# Patient Record
Sex: Male | Born: 1935
Health system: Southern US, Community
[De-identification: ages and names within clinical notes are randomized; demographics above are authoritative.]

## PROBLEM LIST (undated history)

## (undated) DIAGNOSIS — R5381 Other malaise: Secondary | ICD-10-CM

## (undated) DIAGNOSIS — K56609 Unspecified intestinal obstruction, unspecified as to partial versus complete obstruction: Secondary | ICD-10-CM

## (undated) DIAGNOSIS — M899 Disorder of bone, unspecified: Secondary | ICD-10-CM

## (undated) DIAGNOSIS — Z951 Presence of aortocoronary bypass graft: Principal | ICD-10-CM

## (undated) DIAGNOSIS — I10 Essential (primary) hypertension: Secondary | ICD-10-CM

## (undated) DIAGNOSIS — I4891 Unspecified atrial fibrillation: Secondary | ICD-10-CM

## (undated) HISTORY — DX: Unspecified atrial fibrillation: I48.91

## (undated) HISTORY — DX: Presence of aortocoronary bypass graft: Z95.1

## (undated) HISTORY — DX: Other malaise: R53.81

## (undated) HISTORY — DX: Essential (primary) hypertension: I10

## (undated) HISTORY — PX: PARATHYROIDECTOMY: SHX19

## (undated) HISTORY — DX: Unspecified intestinal obstruction, unspecified as to partial versus complete obstruction: K56.609

## (undated) HISTORY — DX: Disorder of bone, unspecified: M89.9

## (undated) HISTORY — PX: APPENDECTOMY: SHX54

## (undated) HISTORY — PX: HERNIA REPAIR: SHX51

---

## 2002-05-14 ENCOUNTER — Encounter: Payer: Self-pay | Admitting: Geriatric Medicine

## 2002-05-14 ENCOUNTER — Encounter: Admission: RE | Admit: 2002-05-14 | Discharge: 2002-05-14 | Payer: Self-pay | Admitting: Geriatric Medicine

## 2003-01-29 ENCOUNTER — Encounter: Payer: Self-pay | Admitting: Emergency Medicine

## 2003-01-29 ENCOUNTER — Emergency Department (HOSPITAL_COMMUNITY): Admission: EM | Admit: 2003-01-29 | Discharge: 2003-01-29 | Payer: Self-pay | Admitting: Emergency Medicine

## 2003-06-02 ENCOUNTER — Encounter: Payer: Self-pay | Admitting: Geriatric Medicine

## 2003-06-02 ENCOUNTER — Encounter: Admission: RE | Admit: 2003-06-02 | Discharge: 2003-06-02 | Payer: Self-pay | Admitting: Geriatric Medicine

## 2004-06-29 ENCOUNTER — Encounter: Admission: RE | Admit: 2004-06-29 | Discharge: 2004-06-29 | Payer: Self-pay | Admitting: Geriatric Medicine

## 2006-07-29 ENCOUNTER — Encounter: Admission: RE | Admit: 2006-07-29 | Discharge: 2006-07-29 | Payer: Self-pay | Admitting: Geriatric Medicine

## 2007-02-05 ENCOUNTER — Encounter: Payer: Self-pay | Admitting: Cardiology

## 2008-02-09 ENCOUNTER — Encounter: Payer: Self-pay | Admitting: Cardiology

## 2008-02-11 ENCOUNTER — Encounter: Admission: RE | Admit: 2008-02-11 | Discharge: 2008-02-11 | Payer: Self-pay | Admitting: Geriatric Medicine

## 2010-07-31 ENCOUNTER — Ambulatory Visit: Payer: Self-pay | Admitting: Internal Medicine

## 2010-07-31 ENCOUNTER — Inpatient Hospital Stay (HOSPITAL_COMMUNITY): Admission: EM | Admit: 2010-07-31 | Discharge: 2010-08-02 | Payer: Self-pay | Admitting: Emergency Medicine

## 2010-08-01 ENCOUNTER — Encounter (INDEPENDENT_AMBULATORY_CARE_PROVIDER_SITE_OTHER): Payer: Self-pay | Admitting: Internal Medicine

## 2010-08-04 ENCOUNTER — Ambulatory Visit: Payer: Self-pay | Admitting: Internal Medicine

## 2010-08-04 LAB — CONVERTED CEMR LAB

## 2010-08-08 ENCOUNTER — Encounter: Payer: Self-pay | Admitting: Internal Medicine

## 2010-08-15 ENCOUNTER — Encounter: Payer: Self-pay | Admitting: Internal Medicine

## 2010-08-15 ENCOUNTER — Inpatient Hospital Stay (HOSPITAL_COMMUNITY): Admission: EM | Admit: 2010-08-15 | Discharge: 2010-08-18 | Payer: Self-pay | Admitting: Emergency Medicine

## 2010-08-15 ENCOUNTER — Ambulatory Visit: Payer: Self-pay | Admitting: Internal Medicine

## 2010-08-18 ENCOUNTER — Encounter: Payer: Self-pay | Admitting: Internal Medicine

## 2010-08-21 ENCOUNTER — Ambulatory Visit: Payer: Self-pay | Admitting: Physician Assistant

## 2010-08-21 DIAGNOSIS — I10 Essential (primary) hypertension: Secondary | ICD-10-CM | POA: Insufficient documentation

## 2010-08-21 DIAGNOSIS — I4891 Unspecified atrial fibrillation: Secondary | ICD-10-CM | POA: Insufficient documentation

## 2010-08-28 ENCOUNTER — Telehealth (INDEPENDENT_AMBULATORY_CARE_PROVIDER_SITE_OTHER): Payer: Self-pay | Admitting: *Deleted

## 2010-09-15 ENCOUNTER — Encounter: Payer: Self-pay | Admitting: Internal Medicine

## 2010-09-18 ENCOUNTER — Ambulatory Visit: Payer: Self-pay | Admitting: Internal Medicine

## 2010-10-24 ENCOUNTER — Encounter: Payer: Self-pay | Admitting: Internal Medicine

## 2010-10-27 ENCOUNTER — Encounter: Payer: Self-pay | Admitting: Internal Medicine

## 2010-11-08 ENCOUNTER — Encounter: Payer: Self-pay | Admitting: Cardiovascular Disease

## 2010-11-10 ENCOUNTER — Encounter: Payer: Self-pay | Admitting: Cardiovascular Disease

## 2010-11-10 ENCOUNTER — Telehealth (INDEPENDENT_AMBULATORY_CARE_PROVIDER_SITE_OTHER): Payer: Self-pay | Admitting: *Deleted

## 2010-11-13 ENCOUNTER — Other Ambulatory Visit: Payer: Self-pay | Admitting: Internal Medicine

## 2010-11-13 ENCOUNTER — Ambulatory Visit: Admission: RE | Admit: 2010-11-13 | Discharge: 2010-11-13 | Payer: Self-pay | Source: Home / Self Care

## 2010-11-13 LAB — CBC WITH DIFFERENTIAL/PLATELET
Eosinophils Absolute: 0.2 10*3/uL (ref 0.0–0.7)
Eosinophils Relative: 1.8 % (ref 0.0–5.0)
MCHC: 33.7 g/dL (ref 30.0–36.0)
MCV: 94 fl (ref 78.0–100.0)
Monocytes Absolute: 0.5 10*3/uL (ref 0.1–1.0)
Neutrophils Relative %: 76 % (ref 43.0–77.0)
Platelets: 294 10*3/uL (ref 150.0–400.0)
WBC: 8.4 10*3/uL (ref 4.5–10.5)

## 2010-11-13 LAB — BASIC METABOLIC PANEL
BUN: 13 mg/dL (ref 6–23)
Chloride: 99 mEq/L (ref 96–112)
Creatinine, Ser: 1 mg/dL (ref 0.4–1.5)

## 2010-11-13 LAB — PROTIME-INR: Prothrombin Time: 21.3 s — ABNORMAL HIGH (ref 10.2–12.4)

## 2010-11-13 LAB — APTT: aPTT: 36.4 s — ABNORMAL HIGH (ref 21.7–28.8)

## 2010-11-14 NOTE — Assessment & Plan Note (Signed)
Summary: ROV. F/U AFIB. ? CARDIOVERSION. GD   Visit Type:  ROV / CARDIOVERSION Primary Provider:  Dr Merlene Laughter  CC:  no complaints.  History of Present Illness: [name] is seen following evaluation in the hospital in October for new onset atrial fibrillation occurred in the context of systemic illness. His son had a rapid ventricular response which was controlled adequately with rate controlling medications. Echo cardiogram at that time demonstrated normal left ventricular function and moderate left atrial dilatation. The hospital records were reviewed  Prior to his cardioversion, he developed a small bowel obstruction and was treated medically. He now presents with anticipated cardioversion.  his INR on presentation to come hospital in early November was 1.9. According to the patient he has had one subsequent to that at 1.8.  He has no symptoms that I can attribute to his atrial fibrillation.  Problems Prior to Update: 1)  Essential Hypertension, Benign  (ICD-401.1) 2)  Atrial Fibrillation  (ICD-427.31)  Current Medications (verified): 1)  Diltiazem Hcl Er Beads 180 Mg Xr24h-Cap (Diltiazem Hcl Er Beads) .... Take One Capsule By Mouth Daily 2)  Metoprolol Tartrate 100 Mg Tabs (Metoprolol Tartrate) .... Take One Tablet By Mouth Twice A Day 3)  Tums 500 Mg Chew (Calcium Carbonate Antacid) .... Take 1 Tablet By Mouth Three Times A Day 4)  Coumadin 5 Mg Tabs (Warfarin Sodium) .... As Directed 5)  Centrum Silver  Tabs (Multiple Vitamins-Minerals) .... Take 1 Tablet By Mouth Once A Day  Allergies (verified): No Known Drug Allergies  Past History:  Past Medical History: Last updated: 09/07/10  1. Small-bowel obstruction.   2. Atrial fibrillation with rapid ventricular response (resolved).   3. Hypertension.   4. Lytic bone lesions.   5. Debility.   Past Surgical History: Last updated: 07-Sep-2010  1. Remote appendectomy.   2. Hernia repair x2.   3.. History of  parathyroidectomy.      Family History: Last updated: 2010/09/07  His mother died at age of 78 from old age.  Father   deceased at age of 104 from old age.  No history of CAD, stroke or heart  attack between the 2.      Social History: Last updated: 09/07/2010  Patient is a retired Financial controller.  Lives at [facility] independent living with his wife of 64 years.  The patient has 1  child who lives in [place].  He denies tobacco abuse, drinks 1 glass of  wine every day, and denies any drug abuse.      Vital Signs:  Patient profile:   77 year old male Height:      57 inches Weight:      146.50 pounds BMI:     31.82 Pulse rate:   56 / minute BP sitting:   138 / 82  (left arm) Cuff size:   regular  Vitals Entered By: [name] CMA (September 18, 2010 9:58 AM)  Physical Exam  General:  The patient was alert and oriented in no acute distress. HEENT Normal.  Neck veins were flat, carotids were brisk.  Lungs were clear.  Heart sounds were regular without murmurs or gallops.  Abdomen was soft with active bowel sounds. There is no clubbing cyanosis or edema. Skin Warm and dry    Impression & Recommendations:  Problem # 1:  ATRIAL FIBRILLATION (ICD-427.31) the patient has atrial fibrillation is persistent. We will try and undertake cardioversion the absence of a terrific support to see if  we can restore and maintain sinus rhythm. Given his paucity associated symptoms, however, I would not pursue a strategy of rhythm control.  We will obtain the records from Dr. Laverle Hobby office as to the consistency of his INRs. When we have three> 2.0  we will proceed with cardioversion  obtain a review of the records from Dr. Pete Glatter, his most recent INR was 11/22. It was 1.85. I have been in contact with his office to clarify our goals of INRs greater than 2. They will let us know when we have 3 Weeks in a row His updated medication list for this problem includes:    Metoprolol  Tartrate 100 Mg Tabs (Metoprolol tartrate) .[name]... Take one tablet by mouth twice a day    Coumadin 5 Mg Tabs (Warfarin sodium) .[name]... As directed  Patient Instructions: 1)  Your physician recommends that you continue on your current medications as directed. Please refer to the Current Medication list given to you today. 2)  Notify this office when your INR is therapeutic.

## 2010-11-14 NOTE — Medication Information (Signed)
Summary: ADD ON  COUMADIN OK PER ERICA/SL  Anticoagulant Therapy  Managed by: Cloyde Reams, RN, BSN Referring MD: Dr Sherryl Manges PCP: Dr Merlene Laughter Supervising MD: Tenny Craw MD, Gunnar Fusi Indication 1: atrial Fibrillation 427.31 Lab Used: [facility] [place] Site: [address] INR POC 1.0 INR RANGE 2.0-3.0  Dietary changes: yes       Details: Educated on importance of dietary consistancy of vit K.   Health status changes: no    Bleeding/hemorrhagic complications: yes       Details: Advised to monitor for s+s of bleeding and to call with any problems.   Recent/future hospitalizations: yes       Details: Hospitalized 07/31/10-08/02/10.   Any changes in medication regimen? yes       Details: Advised pt to call clinic to notify if any medications stopped or started.   Recent/future dental: no  Any missed doses?: no       Is patient compliant with meds? no     Details: Educated pt on importance of medication compliance.   Comments: INR on 08/02/10 at discharge was 1.0.  Discharged on 3mg  daily of Coumadin, given an eight day rx.  Per Dr Graciela Husbands possibly pending DCCV after 3 weeks therapeutic INR's. Pt has appt to see Dr Graciela Husbands on 08/21/10. New pt education done.    Anticoagulation Management History:      The patient comes in today for his initial visit for anticoagulation therapy.  Positive risk factors for bleeding include an age of 75 years or older.  The bleeding index is 'intermediate risk'.  Positive CHADS2 values include Age > 75 years old.  Anticoagulation responsible Daylah Sayavong: Tenny Craw MD, Gunnar Fusi.  INR POC: 1.0.  Cuvette Lot#: [id number].  Exp: 09/2011.    Anticoagulation Management Assessment/Plan:      The target INR is 2.0-3.0.  The next INR is due 08/08/2010.  Results were reviewed/authorized by Cloyde Reams, RN, BSN.  He was notified by Cloyde Reams RN.         Current Anticoagulation Instructions: INR 1.0  Continue taking 3mg  daily.  Recheck on Tuesday 08/08/10  at Dr Laverle Hobby office. Call us at [phone] and let us know if he is agreeable to follow Coumadin or if we need to schedule f/u appt here.

## 2010-11-14 NOTE — Progress Notes (Signed)
  Phone Note From Other Clinic   Caller: Dr.Stoneking/[facility] Initial call taken by: KM    Faxed LOV to [phone] [facility]  August 28, 2010 2:07 PM

## 2010-11-14 NOTE — Consult Note (Signed)
[facility]   Imported By: Roderic Ovens 08/28/2010 09:29:06  _____________________________________________________________________  External Attachment:    Type:   Image     Comment:   External Document

## 2010-11-14 NOTE — Assessment & Plan Note (Signed)
Summary: eph/ gd   Visit Type:  Follow-up Primary Provider:  Dr Merlene Laughter  CC:  Post-hospital.  History of Present Illness: [name] is an 77 year old male with a history of hypertension and possible multiple myeloma who was recently admitted with elevated heart rate.  He was noted to be in atrial fibrillation with elevated heart rate.  He was placed on Coumadin and rate controlling medications.  He returned to the hospital couple of weeks later with a small bowel obstruction.  Cardiology was asked to manage his rate controlling medications during that admission. He returns today for post hospitalization followup.  He is doing well.  No palps.  No chest pain.  No dyspnea.  No orthopnea or PND.  No edema.  No syncope. Dr. Pete Glatter follows his coumadin at [facility].  He has not had checked since discharge.  His INR was 1.9 in the [facility] on 08/18/2010.  He expects to have an elective DCCV once his INRs have been therapeutic for an acceptable amount of time.   Current Medications (verified): 1)  Diltiazem Hcl Er Beads 120 Mg Xr24h-Cap (Diltiazem Hcl Er Beads) .... Take One Capsule By Mouth Daily 2)  Metoprolol Tartrate 100 Mg Tabs (Metoprolol Tartrate) .... Take One Tablet By Mouth Twice A Day 3)  Tums 500 Mg Chew (Calcium Carbonate Antacid) .... Take 1 Tablet By Mouth Three Times A Day 4)  Coumadin 5 Mg Tabs (Warfarin Sodium) .... As Directed 5)  Centrum Silver  Tabs (Multiple Vitamins-Minerals) .... Take 1 Tablet By Mouth Once A Day  Allergies (verified): No Known Drug Allergies  Past History:  Past Medical History: Persistent Atrial fibrillation     a.  coumadin therapy Hypertension.  Echo 07/2010:  EF 60-65%, mild LVH, Mod LAE Possible Multiple Myeloma Small bowel obstruction admx 08/15/2010         Past Surgical History: Status post remote appendectomy.  Status post parathyroidectomy  Status post hernia repair.      Social History: The patient lives in [place]  at the [facility] with his wife.  He is a retired Designer, multimedia.  He has a son who lives in [place].  He drinks 1 bourbon and   water every day around 5 p.m.  He denies any tobacco or illicit drug   use.  He exercises regularly.   Vital Signs:  Patient profile:   77 year old male Height:      57 inches Weight:      147.75 pounds BMI:     32.09 Pulse rate:   93 / minute Pulse rhythm:   irregular Resp:     18 per minute BP sitting:   160 / 82  (right arm) Cuff size:   regular  Vitals Entered By: Vikki Ports (August 21, 2010 2:43 PM)  Physical Exam  General:  Well nourished, well developed, in no acute distress HEENT: normal Neck: no JVD Cardiac:  normal S1, S2; irreg irreg rhythm; no murmur Lungs:  clear to auscultation bilaterally, no wheezing, rhonchi or rales Abd: soft, nontender, no hepatomegaly Ext: no edema Vascular: no carotid  bruits Skin: warm and dry Neuro:  CNs 2-12 intact, no focal abnormalities noted    EKG  Procedure date:  08/21/2010  Findings:      Atrial fibrillation with a controlled ventricular response rate of: 93 Left axis deviation Interventricular conduction delay Incomplete right bundle branch block No ischemic changes  Impression & Recommendations:  Problem #  1:  ATRIAL FIBRILLATION (ICD-427.31)  Will have [facility] continue to monitor INRs. Dr. Pete Glatter to manage coumadin. INRs will be sent to [place] and elective DCCV will be arranged once he has 3 therapeutic INRs in a row. Discussed with Dr. Graciela Husbands.  Orders: EKG w/ Interpretation (93000)  Problem # 2:  ESSENTIAL HYPERTENSION, BENIGN (ICD-401.1) Repeat 140/80 by me. HR in 90s and likely goes much higher with exercise. Change Diltiazem to 180 mg once daily.  His updated medication list for this problem includes:    Diltiazem Hcl Er Beads 180 Mg Xr24h-cap (Diltiazem hcl er beads) .[name]... Take one capsule by mouth daily    Metoprolol Tartrate 100 Mg  Tabs (Metoprolol tartrate) .[name]... Take one tablet by mouth twice a day  Patient Instructions: 1)  We will arrange the cardioversion once you have a therapeutic coumadin level for 3 weeks. 2)  If you have not heard from [place] in 4 weeks, please call. 3)  We will arrange follow up with Dr. Graciela Husbands at the time of your cardioversion. 4)  Your physician has recommended you make the following change in your medication: Diltiazem 180 mg. Take one tablet by mouth daily 5)  Please check INR tomorrow  and every week in Dr. Hayden Rasmussen office fax INR results to our office to Dr. Graciela Husbands Fax # [phone] . When pt has 2 to 3 levels of  3 INR in a road, pt will be schedule for Cardioversion   Prescriptions: DILTIAZEM HCL ER BEADS 180 MG XR24H-CAP (DILTIAZEM HCL ER BEADS) Take one capsule by mouth daily  #30 x 6   Entered by:   Ollen Gross, RN, BSN   Authorized by:   Tereso Newcomer PA-C   Signed by:   Ollen Gross, RN, BSN on 08/21/2010   Method used:   Electronically to        [facility]. [phone]* (retail)       [address]       [place], [place]  [address]       Ph: [phone]       Fax: [phone]   RxID:   [id number]  I have personally reviewed the prescriptions today for accuracy. Tereso Newcomer PA-C  August 21, 2010 5:22 PM

## 2010-11-14 NOTE — Letter (Signed)
Summary: [facility]   Imported By: Roderic Ovens 08/31/2010 12:20:49  _____________________________________________________________________  External Attachment:    Type:   Image     Comment:   External Document

## 2010-11-15 ENCOUNTER — Ambulatory Visit (HOSPITAL_COMMUNITY)
Admission: RE | Admit: 2010-11-15 | Discharge: 2010-11-15 | Disposition: A | Discharge status: Home / Self Care | Payer: Medicare Other | Source: Ambulatory Visit | Attending: Cardiovascular Disease | Admitting: Cardiovascular Disease

## 2010-11-15 DIAGNOSIS — Z5309 Procedure and treatment not carried out because of other contraindication: Secondary | ICD-10-CM | POA: Insufficient documentation

## 2010-11-15 DIAGNOSIS — R791 Abnormal coagulation profile: Secondary | ICD-10-CM | POA: Insufficient documentation

## 2010-11-15 DIAGNOSIS — I4891 Unspecified atrial fibrillation: Secondary | ICD-10-CM | POA: Insufficient documentation

## 2010-11-16 NOTE — Medication Information (Signed)
Summary: Coumadin Clinic  Anticoagulant Therapy  Managed by: Inactive Referring MD: Dr Sherryl Manges PCP: Dr Merlene Laughter Supervising MD: Tenny Craw MD, Gunnar Fusi Indication 1: atrial Fibrillation 427.31 Lab Used: [facility]  Site: [address] INR RANGE 2.0-3.0          Comments: Dr Pete Glatter is dosing INR's  Allergies: No Known Drug Allergies  Anticoagulation Management History:      Positive risk factors for bleeding include an age of 75 years or older.  The bleeding index is 'intermediate risk'.  Positive CHADS2 values include History of HTN and Age > 75 years old.  Anticoagulation responsible provider: Tenny Craw MD, Gunnar Fusi.  Exp: 09/2011.    Anticoagulation Management Assessment/Plan:      The patient's current anticoagulation dose is Coumadin 5 mg tabs: as directed.  The target INR is 2.0-3.0.  The next INR is due 08/08/2010.  Results were reviewed/authorized by Inactive.         Prior Anticoagulation Instructions: INR 1.0  Continue taking 3mg  daily.  Recheck on Tuesday 08/08/10 at Dr Laverle Hobby office. Call us at [phone] and let us know if he is agreeable to follow Coumadin or if we need to schedule f/u appt here.

## 2010-11-16 NOTE — Letter (Signed)
Summary: [name] Office Visit Note   [facility] Office Visit Note   Imported By: Roderic Ovens 09/27/2010 10:59:03  _____________________________________________________________________  External Attachment:    Type:   Image     Comment:   External Document

## 2010-11-16 NOTE — Progress Notes (Signed)
  Phone Note Outgoing Call   Call placed by: Scherrie Bateman, LPN,  November 10, 2010 12:24 PM Call placed to: Patient Summary of Call: CALLED PT TO SCHEDULE  CARDIOVERSION PER 11/07/10 OFFICE NOTE CARDIOVERSION SCEHUDLED FOR 11/15/10 AT 10:00 AM.PT COMING IN MON 11/13/10  AT 10:30 FOR PRE PROCEDURE LABS. Initial call taken by: Scherrie Bateman, LPN,  November 10, 2010 12:26 PM

## 2010-11-16 NOTE — Letter (Addendum)
Summary: Cardioversion/TEE Instructions  [facility], Main Office  [address]   [place], [place] [address]   Phone: [phone]  Fax: [phone]     Cardioversion Instructions  11/10/2010 MRN: [id number]  [facility] [address] [place], [place]  [address]  Dear [name], You are scheduled for a  Cardioversion on ____________2/1/12________________ with Dr. ________DR Theron Arista NISHAN_________________________________.   Please arrive at the [facility] at _______8:00______ a.m. [name] on the day of your procedure.  1)   DIET:  A)   Nothing to eat or drink after midnight except your medications with a sip of water.  B).  2)   Come to the [place] office on ______1/30/12______________ for lab work. The lab at [facility] is open from 8:30 a.m. to 1:30 p.m. and 2:30 p.m. to 5:00 p.m. The lab at [facility] is open from 7:30 a.m. to 5:30 p.m. You do not have to be fasting.  3)   MAKE SURE YOU TAKE YOUR COUMADIN.  4)   A)       B)   YOU MAY TAKE ALL of your remaining medications with a small amount of water.    C)   START NEW medications:       ___________________________________________________________________     ___________________________________________________________________  5)  Must have a responsible person to drive you home.  6)   Bring a current list of your medications and current insurance cards.   * Special Note:  Every effort is made to have your procedure done on time. Occasionally there are emergencies that present themselves at the hospital that may cause delays. Please be patient if a delay does occur.  * If you have any questions after you get home, please call the office at [phone].

## 2010-11-22 ENCOUNTER — Telehealth: Payer: Self-pay | Admitting: Internal Medicine

## 2010-11-30 NOTE — Progress Notes (Signed)
Summary: has the patient had recent cardioversion  Phone Note From Other Clinic Call back at [facility] Phone [phone]   Caller: stephaine office [phone].  Request: Talk with Nurse Summary of Call: has the patient had a recent cardioversion.  Initial call taken by: Lorne Skeens,  November 22, 2010 11:45 AM  Follow-up for Phone Call        Judeth Cornfield the caller is at lunch. I will call her taler. Ollen Gross, RN, BSN  November 22, 2010 12:32 PM  I left a detail message to stephanie  in her answer service at [facility] physicians. "Pt. did not have the cardioversion on 11/15/10. Procedure was canceled due to blood Pt/INR was not to where it needed to be to do the cardioversion". Call the office if any questions. Follow-up by: Ollen Gross, RN, BSN,  November 22, 2010 3:33 PM

## 2010-12-05 ENCOUNTER — Encounter: Payer: Medicare Other | Admitting: Internal Medicine

## 2010-12-21 NOTE — Letter (Signed)
Summary: [name]: Telephone Encounter  [name]: Telephone Encounter   Imported By: Earl Many 12/08/2010 08:49:31  _____________________________________________________________________  External Attachment:    Type:   Image     Comment:   External Document

## 2010-12-22 ENCOUNTER — Encounter (INDEPENDENT_AMBULATORY_CARE_PROVIDER_SITE_OTHER): Payer: Self-pay | Admitting: *Deleted

## 2010-12-26 ENCOUNTER — Encounter: Payer: Self-pay | Admitting: Internal Medicine

## 2010-12-26 LAB — PROTEIN ELECTROPH W RFLX QUANT IMMUNOGLOBULINS
Alpha-2-Globulin: 14.7 % — ABNORMAL HIGH (ref 7.1–11.8)
Beta 2: 5.1 % (ref 3.2–6.5)
Beta Globulin: 5.8 % (ref 4.7–7.2)
Gamma Globulin: 14.6 % (ref 11.1–18.8)
M-Spike, %: NOT DETECTED g/dL
Total Protein ELP: 6.4 g/dL (ref 6.0–8.3)

## 2010-12-26 LAB — DIFFERENTIAL
Basophils Relative: 0 % (ref 0–1)
Lymphs Abs: 0.6 10*3/uL — ABNORMAL LOW (ref 0.7–4.0)
Monocytes Relative: 2 % — ABNORMAL LOW (ref 3–12)
Neutro Abs: 10.7 10*3/uL — ABNORMAL HIGH (ref 1.7–7.7)
Neutrophils Relative %: 92 % — ABNORMAL HIGH (ref 43–77)

## 2010-12-26 LAB — UIFE/LIGHT CHAINS/TP QN, 24-HR UR
Alpha 2, Urine: DETECTED — AB
Beta, Urine: DETECTED — AB
Free Kappa Lt Chains,Ur: 10.7 mg/dL — ABNORMAL HIGH (ref 0.04–1.51)
Free Kappa/Lambda Ratio: 7.23 ratio — ABNORMAL HIGH (ref 0.46–4.00)
Free Lambda Lt Chains,Ur: 1.48 mg/dL — ABNORMAL HIGH (ref 0.08–1.01)
Free Lt Chn Excr Rate: 208.65 mg/d
Gamma Globulin, Urine: NOT DETECTED
Time: 24 hours
Total Protein, Urine-Ur/day: 248 mg/d — ABNORMAL HIGH (ref 10–140)
Total Protein, Urine: 12.7 mg/dL
Volume, Urine: 1950 mL

## 2010-12-26 LAB — CARDIAC PANEL(CRET KIN+CKTOT+MB+TROPI)
CK, MB: 1.7 ng/mL (ref 0.3–4.0)
Relative Index: INVALID (ref 0.0–2.5)
Total CK: 68 U/L (ref 7–232)
Total CK: 74 U/L (ref 7–232)

## 2010-12-26 LAB — COMPREHENSIVE METABOLIC PANEL
ALT: 21 U/L (ref 0–53)
AST: 20 U/L (ref 0–37)
AST: 22 U/L (ref 0–37)
Albumin: 2.8 g/dL — ABNORMAL LOW (ref 3.5–5.2)
Albumin: 2.8 g/dL — ABNORMAL LOW (ref 3.5–5.2)
Alkaline Phosphatase: 53 U/L (ref 39–117)
Alkaline Phosphatase: 54 U/L (ref 39–117)
BUN: 13 mg/dL (ref 6–23)
BUN: 6 mg/dL (ref 6–23)
BUN: 7 mg/dL (ref 6–23)
CO2: 26 mEq/L (ref 19–32)
Calcium: 8 mg/dL — ABNORMAL LOW (ref 8.4–10.5)
Calcium: 8.6 mg/dL (ref 8.4–10.5)
Calcium: 9.3 mg/dL (ref 8.4–10.5)
Chloride: 105 mEq/L (ref 96–112)
Creatinine, Ser: 0.78 mg/dL (ref 0.4–1.5)
Creatinine, Ser: 0.86 mg/dL (ref 0.4–1.5)
GFR calc Af Amer: >60 mL/min (ref >60)
Glucose, Bld: 106 mg/dL — ABNORMAL HIGH (ref 70–99)
Glucose, Bld: 147 mg/dL — ABNORMAL HIGH (ref 70–99)
Glucose, Bld: 92 mg/dL (ref 70–99)
Potassium: 3.3 mEq/L — ABNORMAL LOW (ref 3.5–5.1)
Potassium: 3.5 mEq/L (ref 3.5–5.1)
Potassium: 4 mEq/L (ref 3.5–5.1)
Sodium: 137 mEq/L (ref 135–145)
Sodium: 138 mEq/L (ref 135–145)
Total Bilirubin: 0.7 mg/dL (ref 0.3–1.2)
Total Bilirubin: 0.8 mg/dL (ref 0.3–1.2)
Total Protein: 5.4 g/dL — ABNORMAL LOW (ref 6.0–8.3)
Total Protein: 6.2 g/dL (ref 6.0–8.3)
Total Protein: 6.7 g/dL (ref 6.0–8.3)

## 2010-12-26 LAB — URINE CULTURE

## 2010-12-26 LAB — CBC
HCT: 37.8 % — ABNORMAL LOW (ref 39.0–52.0)
HCT: 39.1 % (ref 39.0–52.0)
HCT: 42.4 % (ref 39.0–52.0)
HCT: 43.5 % (ref 39.0–52.0)
MCH: 32 pg (ref 26.0–34.0)
MCH: 32.2 pg (ref 26.0–34.0)
MCH: 32.2 pg (ref 26.0–34.0)
MCHC: 34 g/dL (ref 30.0–36.0)
MCHC: 34.5 g/dL (ref 30.0–36.0)
MCHC: 34.6 g/dL (ref 30.0–36.0)
MCV: 92.8 fL (ref 78.0–100.0)
MCV: 92.9 fL (ref 78.0–100.0)
MCV: 94.3 fL (ref 78.0–100.0)
MCV: 94.9 fL (ref 78.0–100.0)
Platelets: 264 10*3/uL (ref 150–400)
Platelets: 285 10*3/uL (ref 150–400)
RBC: 4.21 MIL/uL — ABNORMAL LOW (ref 4.22–5.81)
RDW: 13.7 % (ref 11.5–15.5)
RDW: 13.9 % (ref 11.5–15.5)
RDW: 13.9 % (ref 11.5–15.5)
RDW: 13.9 % (ref 11.5–15.5)
WBC: 8.7 10*3/uL (ref 4.0–10.5)
WBC: 9.7 10*3/uL (ref 4.0–10.5)
WBC: 9.9 10*3/uL (ref 4.0–10.5)

## 2010-12-26 LAB — PROTIME-INR
INR: 1.91 — ABNORMAL HIGH (ref 0.00–1.49)
INR: 2.12 — ABNORMAL HIGH (ref 0.00–1.49)
INR: 2.49 — ABNORMAL HIGH (ref 0.00–1.49)
INR: 2.56 — ABNORMAL HIGH (ref 0.00–1.49)
Prothrombin Time: 27 seconds — ABNORMAL HIGH (ref 11.6–15.2)

## 2010-12-26 LAB — URINALYSIS, ROUTINE W REFLEX MICROSCOPIC
Bilirubin Urine: NEGATIVE
Nitrite: NEGATIVE
Protein, ur: NEGATIVE mg/dL
Specific Gravity, Urine: 1.018 (ref 1.005–1.030)
Urobilinogen, UA: 0.2 mg/dL (ref 0.0–1.0)

## 2010-12-26 LAB — CK TOTAL AND CKMB (NOT AT ARMC)
CK, MB: 2.2 ng/mL (ref 0.3–4.0)
Total CK: 50 U/L (ref 7–232)

## 2010-12-26 LAB — LIPASE, BLOOD: Lipase: 31 U/L (ref 11–59)

## 2010-12-26 LAB — TROPONIN I: Troponin I: 0.01 ng/mL (ref 0.00–0.06)

## 2010-12-26 NOTE — Letter (Signed)
Summary: Appointment - Reschedule  [employer], Main Office  [address]   [place], [place] [address]   Phone: [phone]  Fax: [phone]     December 22, 2010 MRN: [id number]   [facility] [address] [place], [place]  [address]   Dear Mr. SWEENEY,   Due to a change in our office schedule, your appointment on 01-09-2011                      at    10:45 a.m.            must be changed.  It is very important that we reach you to reschedule this appointment. We look forward to participating in your health care needs. Please contact us at the number listed above at your earliest convenience to reschedule this appointment.     Sincerely,      Lorne Skeens  [facility] Scheduling Team

## 2010-12-27 LAB — CBC
MCH: 31.4 pg (ref 26.0–34.0)
MCV: 92.2 fL (ref 78.0–100.0)
MCV: 94.3 fL (ref 78.0–100.0)
Platelets: 294 10*3/uL (ref 150–400)
Platelets: 299 10*3/uL (ref 150–400)
RBC: 4.23 MIL/uL (ref 4.22–5.81)
RBC: 4.6 MIL/uL (ref 4.22–5.81)
WBC: 8 10*3/uL (ref 4.0–10.5)

## 2010-12-27 LAB — PROTIME-INR: INR: 1 (ref 0.00–1.49)

## 2010-12-27 LAB — CK TOTAL AND CKMB (NOT AT ARMC)
CK, MB: 1.9 ng/mL (ref 0.3–4.0)
Relative Index: INVALID (ref 0.0–2.5)

## 2010-12-27 LAB — DIFFERENTIAL
Eosinophils Relative: 1 % (ref 0–5)
Lymphocytes Relative: 14 % (ref 12–46)
Lymphs Abs: 1.1 10*3/uL (ref 0.7–4.0)
Monocytes Absolute: 0.9 10*3/uL (ref 0.1–1.0)

## 2010-12-27 LAB — COMPREHENSIVE METABOLIC PANEL
AST: 18 U/L (ref 0–37)
Albumin: 3.5 g/dL (ref 3.5–5.2)
Calcium: 8.8 mg/dL (ref 8.4–10.5)
Creatinine, Ser: 0.87 mg/dL (ref 0.4–1.5)
GFR calc Af Amer: >60 mL/min (ref >60)

## 2010-12-27 LAB — CARDIAC PANEL(CRET KIN+CKTOT+MB+TROPI)
Relative Index: INVALID (ref 0.0–2.5)
Troponin I: 0.04 ng/mL (ref 0.00–0.06)

## 2010-12-27 LAB — TSH
TSH: 1.567 u[IU]/mL (ref 0.350–4.500)
TSH: 2.018 u[IU]/mL (ref 0.350–4.500)

## 2010-12-27 LAB — TROPONIN I: Troponin I: <0.01 ng/mL (ref 0.00–0.06)

## 2010-12-29 ENCOUNTER — Telehealth: Payer: Self-pay | Admitting: *Deleted

## 2011-01-02 NOTE — Progress Notes (Signed)
  Phone Note Outgoing Call   Call placed by: Scherrie Bateman, LPN,  December 29, 2010 2:33 PM Call placed to: Patient Summary of Call: SPOKE WITH PT HAS STARTED SEEING DR TURNER FOR CARDIAC NEEDS HAS DECIDED TO TAKE by mouth MEDS FOR RATE CONTROL HAVE DECIDED AGAINST CARDIOVERSION AT THIS TIME. WILL F/U WITH DR TURNER IN FUTURE  FOR CONTINUING CARDIAC CARE. Initial call taken by: Scherrie Bateman, LPN,  December 29, 2010 2:36 PM

## 2011-01-09 ENCOUNTER — Encounter: Payer: Medicare Other | Admitting: Internal Medicine

## 2011-01-30 ENCOUNTER — Encounter: Payer: Self-pay | Admitting: Internal Medicine

## 2013-10-17 ENCOUNTER — Other Ambulatory Visit: Payer: Self-pay

## 2013-10-17 ENCOUNTER — Ambulatory Visit (INDEPENDENT_AMBULATORY_CARE_PROVIDER_SITE_OTHER): Payer: Medicare Other | Admitting: Family Medicine

## 2013-10-17 ENCOUNTER — Ambulatory Visit: Payer: Medicare Other

## 2013-10-17 ENCOUNTER — Encounter (HOSPITAL_COMMUNITY): Payer: Self-pay | Admitting: Emergency Medicine

## 2013-10-17 ENCOUNTER — Inpatient Hospital Stay (HOSPITAL_COMMUNITY)
Admission: EM | Admit: 2013-10-17 | Discharge: 2013-10-29 | DRG: 233 | Disposition: A | Discharge status: Skilled Nursing Facility | Payer: Medicare Other | Attending: Cardiothoracic Surgery | Admitting: Cardiothoracic Surgery

## 2013-10-17 VITALS — BP 130/80 | HR 116 | Temp 98.0°F | Resp 17 | Ht 63.5 in | Wt 153.0 lb

## 2013-10-17 DIAGNOSIS — N179 Acute kidney failure, unspecified: Secondary | ICD-10-CM | POA: Diagnosis not present

## 2013-10-17 DIAGNOSIS — Z77090 Contact with and (suspected) exposure to asbestos: Secondary | ICD-10-CM

## 2013-10-17 DIAGNOSIS — I700 Atherosclerosis of aorta: Secondary | ICD-10-CM | POA: Diagnosis present

## 2013-10-17 DIAGNOSIS — I4891 Unspecified atrial fibrillation: Secondary | ICD-10-CM

## 2013-10-17 DIAGNOSIS — Z7901 Long term (current) use of anticoagulants: Secondary | ICD-10-CM

## 2013-10-17 DIAGNOSIS — E871 Hypo-osmolality and hyponatremia: Secondary | ICD-10-CM | POA: Diagnosis not present

## 2013-10-17 DIAGNOSIS — I482 Chronic atrial fibrillation, unspecified: Secondary | ICD-10-CM | POA: Diagnosis present

## 2013-10-17 DIAGNOSIS — D62 Acute posthemorrhagic anemia: Secondary | ICD-10-CM | POA: Diagnosis not present

## 2013-10-17 DIAGNOSIS — I10 Essential (primary) hypertension: Secondary | ICD-10-CM | POA: Diagnosis present

## 2013-10-17 DIAGNOSIS — Z87891 Personal history of nicotine dependence: Secondary | ICD-10-CM

## 2013-10-17 DIAGNOSIS — I509 Heart failure, unspecified: Secondary | ICD-10-CM

## 2013-10-17 DIAGNOSIS — I959 Hypotension, unspecified: Secondary | ICD-10-CM | POA: Diagnosis not present

## 2013-10-17 DIAGNOSIS — Z8709 Personal history of other diseases of the respiratory system: Secondary | ICD-10-CM

## 2013-10-17 DIAGNOSIS — R091 Pleurisy: Secondary | ICD-10-CM | POA: Diagnosis present

## 2013-10-17 DIAGNOSIS — Z951 Presence of aortocoronary bypass graft: Secondary | ICD-10-CM

## 2013-10-17 DIAGNOSIS — R197 Diarrhea, unspecified: Secondary | ICD-10-CM | POA: Diagnosis not present

## 2013-10-17 DIAGNOSIS — I214 Non-ST elevation (NSTEMI) myocardial infarction: Secondary | ICD-10-CM | POA: Diagnosis present

## 2013-10-17 DIAGNOSIS — I255 Ischemic cardiomyopathy: Secondary | ICD-10-CM

## 2013-10-17 DIAGNOSIS — I5021 Acute systolic (congestive) heart failure: Secondary | ICD-10-CM | POA: Diagnosis present

## 2013-10-17 DIAGNOSIS — R0602 Shortness of breath: Secondary | ICD-10-CM

## 2013-10-17 DIAGNOSIS — R5381 Other malaise: Secondary | ICD-10-CM | POA: Diagnosis present

## 2013-10-17 DIAGNOSIS — I2510 Atherosclerotic heart disease of native coronary artery without angina pectoris: Secondary | ICD-10-CM | POA: Diagnosis present

## 2013-10-17 DIAGNOSIS — I2589 Other forms of chronic ischemic heart disease: Secondary | ICD-10-CM | POA: Diagnosis present

## 2013-10-17 DIAGNOSIS — I2582 Chronic total occlusion of coronary artery: Secondary | ICD-10-CM | POA: Diagnosis present

## 2013-10-17 DIAGNOSIS — Z79899 Other long term (current) drug therapy: Secondary | ICD-10-CM

## 2013-10-17 DIAGNOSIS — I480 Paroxysmal atrial fibrillation: Secondary | ICD-10-CM | POA: Diagnosis present

## 2013-10-17 DIAGNOSIS — J9819 Other pulmonary collapse: Secondary | ICD-10-CM | POA: Diagnosis not present

## 2013-10-17 DIAGNOSIS — I4510 Unspecified right bundle-branch block: Secondary | ICD-10-CM | POA: Diagnosis present

## 2013-10-17 DIAGNOSIS — I252 Old myocardial infarction: Secondary | ICD-10-CM

## 2013-10-17 LAB — URINALYSIS, ROUTINE W REFLEX MICROSCOPIC
BILIRUBIN URINE: NEGATIVE
GLUCOSE, UA: NEGATIVE mg/dL
Hgb urine dipstick: NEGATIVE
KETONES UR: 40 mg/dL — AB
LEUKOCYTES UA: NEGATIVE
Nitrite: NEGATIVE
PROTEIN: NEGATIVE mg/dL
Specific Gravity, Urine: 1.018 (ref 1.005–1.030)
Urobilinogen, UA: 1 mg/dL (ref 0.0–1.0)
pH: 6 (ref 5.0–8.0)

## 2013-10-17 LAB — BASIC METABOLIC PANEL
BUN: 12 mg/dL (ref 6–23)
CALCIUM: 8.7 mg/dL (ref 8.4–10.5)
CO2: 25 meq/L (ref 19–32)
CREATININE: 0.83 mg/dL (ref 0.50–1.35)
Chloride: 101 mEq/L (ref 96–112)
GFR calc Af Amer: 85 mL/min — ABNORMAL LOW (ref >90)
GFR calc non Af Amer: 74 mL/min — ABNORMAL LOW (ref >90)
Glucose, Bld: 102 mg/dL — ABNORMAL HIGH (ref 70–99)
Potassium: 4.1 mEq/L (ref 3.7–5.3)
Sodium: 138 mEq/L (ref 137–147)

## 2013-10-17 LAB — PRO B NATRIURETIC PEPTIDE: PRO B NATRI PEPTIDE: 7408 pg/mL — AB (ref 0–450)

## 2013-10-17 LAB — CBC
HCT: 36.8 % — ABNORMAL LOW (ref 39.0–52.0)
HEMOGLOBIN: 12.5 g/dL — AB (ref 13.0–17.0)
MCH: 30.5 pg (ref 26.0–34.0)
MCHC: 34 g/dL (ref 30.0–36.0)
MCV: 89.8 fL (ref 78.0–100.0)
PLATELETS: 408 10*3/uL — AB (ref 150–400)
RBC: 4.1 MIL/uL — ABNORMAL LOW (ref 4.22–5.81)
RDW: 14.6 % (ref 11.5–15.5)
WBC: 7.8 10*3/uL (ref 4.0–10.5)

## 2013-10-17 LAB — TROPONIN I: Troponin I: <0.3 ng/mL (ref <0.30)

## 2013-10-17 LAB — POCT I-STAT TROPONIN I: Troponin i, poc: 0.16 ng/mL (ref 0.00–0.08)

## 2013-10-17 LAB — PROTIME-INR
INR: 4.12 — ABNORMAL HIGH (ref 0.00–1.49)
Prothrombin Time: 38.3 seconds — ABNORMAL HIGH (ref 11.6–15.2)

## 2013-10-17 LAB — MRSA PCR SCREENING: MRSA by PCR: NEGATIVE

## 2013-10-17 MED ORDER — DM-GUAIFENESIN ER 30-600 MG PO TB12
2.0000 | ORAL_TABLET | Freq: Two times a day (BID) | ORAL | Status: DC | PRN
  Filled 2013-10-17: qty 2

## 2013-10-17 MED ORDER — ADULT MULTIVITAMIN W/MINERALS CH
1.0000 | ORAL_TABLET | Freq: Every day | ORAL | Status: DC
  Administered 2013-10-18 – 2013-10-20 (×3): 1 via ORAL
  Filled 2013-10-17 (×4): qty 1

## 2013-10-17 MED ORDER — DILTIAZEM HCL 100 MG IV SOLR
5.0000 mg/h | INTRAVENOUS | Status: DC
  Administered 2013-10-17 – 2013-10-19 (×5): 15 mg/h via INTRAVENOUS
  Filled 2013-10-17 (×5): qty 100

## 2013-10-17 MED ORDER — FUROSEMIDE 10 MG/ML IJ SOLN
40.0000 mg | Freq: Once | INTRAMUSCULAR | Status: AC
  Administered 2013-10-17: 40 mg via INTRAVENOUS
  Filled 2013-10-17: qty 4

## 2013-10-17 MED ORDER — DILTIAZEM HCL 100 MG IV SOLR
5.0000 mg/h | Freq: Once | INTRAVENOUS | Status: AC
  Administered 2013-10-17: 5 mg/h via INTRAVENOUS

## 2013-10-17 MED ORDER — ASPIRIN 325 MG PO TABS
325.0000 mg | ORAL_TABLET | Freq: Once | ORAL | Status: AC
  Administered 2013-10-17: 325 mg via ORAL
  Filled 2013-10-17: qty 1

## 2013-10-17 MED ORDER — SODIUM CHLORIDE 0.9 % IJ SOLN
3.0000 mL | Freq: Two times a day (BID) | INTRAMUSCULAR | Status: DC
  Administered 2013-10-18 – 2013-10-20 (×2): 3 mL via INTRAVENOUS

## 2013-10-17 MED ORDER — NITROGLYCERIN 0.4 MG SL SUBL: 0.4000 mg | SUBLINGUAL_TABLET | SUBLINGUAL | Status: DC | PRN

## 2013-10-17 MED ORDER — ASPIRIN EC 81 MG PO TBEC
81.0000 mg | DELAYED_RELEASE_TABLET | Freq: Every day | ORAL | Status: DC
  Administered 2013-10-18 – 2013-10-20 (×3): 81 mg via ORAL
  Filled 2013-10-17 (×4): qty 1

## 2013-10-17 MED ORDER — ONE-DAILY MULTI VITAMINS PO TABS: 1.0000 | ORAL_TABLET | Freq: Every day | ORAL | Status: DC

## 2013-10-17 MED ORDER — MUCINEX DM MAXIMUM STRENGTH 60-1200 MG PO TB12: 1.0000 | ORAL_TABLET | Freq: Two times a day (BID) | ORAL | Status: DC | PRN

## 2013-10-17 MED ORDER — ACETAMINOPHEN 325 MG PO TABS: 650.0000 mg | ORAL_TABLET | ORAL | Status: DC | PRN

## 2013-10-17 MED ORDER — ONDANSETRON HCL 4 MG/2ML IJ SOLN: 4.0000 mg | Freq: Four times a day (QID) | INTRAMUSCULAR | Status: DC | PRN

## 2013-10-17 MED ORDER — DILTIAZEM HCL 25 MG/5ML IV SOLN
10.0000 mg | Freq: Once | INTRAVENOUS | Status: AC
  Administered 2013-10-17: 10 mg via INTRAVENOUS
  Filled 2013-10-17: qty 5

## 2013-10-17 MED ORDER — SODIUM CHLORIDE 0.9 % IV SOLN
250.0000 mL | INTRAVENOUS | Status: DC | PRN
  Administered 2013-10-19: 10 mL via INTRAVENOUS

## 2013-10-17 MED ORDER — SODIUM CHLORIDE 0.9 % IJ SOLN: 3.0000 mL | INTRAMUSCULAR | Status: DC | PRN

## 2013-10-17 NOTE — ED Provider Notes (Signed)
CSN: [id number]     Arrival date & time 10/17/13  1353 History   First MD Initiated Contact with Patient 10/17/13 1357     Chief Complaint  Patient presents with  . Shortness of Breath  . Atrial Fibrillation   (Consider location/radiation/quality/duration/timing/severity/associated sxs/prior Treatment) HPI Comments: New dyspnea on lying flat since yesterday  Patient is a 80 y.o. male presenting with cough. The history is provided by the patient.  Cough Cough characteristics:  Non-productive Severity:  Moderate Onset quality:  Gradual Duration:  2 weeks Timing:  Constant Progression:  Worsening Chronicity:  New Smoker: no   Context: not upper respiratory infection and not weather changes   Relieved by:  Nothing Worsened by:  Nothing tried Ineffective treatments:  None tried Associated symptoms: shortness of breath   Associated symptoms: no chest pain, no fever, no rash and no rhinorrhea     Past Medical History  Diagnosis Date  . Small bowel obstruction   . Atrial fibrillation     with rapid ventricular repsonse  . HTN (hypertension)   . Bone lesion     lytic  . Debility    Past Surgical History  Procedure Laterality Date  . Appendectomy    . Hernia repair      x2  . Parathyroidectomy     No family history on file. History  Substance Use Topics  . Smoking status: Never Smoker   . Smokeless tobacco: Not on file  . Alcohol Use: 4.2 oz/week    7 Glasses of wine per week    Review of Systems  Constitutional: Negative for fever.  HENT: Negative for rhinorrhea.   Respiratory: Positive for cough and shortness of breath.   Cardiovascular: Negative for chest pain and leg swelling.  Gastrointestinal: Negative for vomiting and abdominal pain.  Skin: Negative for rash.  All other systems reviewed and are negative.    Allergies  Review of patient's allergies indicates no known allergies.  Home Medications   Current Outpatient Rx  Name  Route  Sig  Dispense   Refill  . Dextromethorphan-Guaifenesin (MUCINEX DM MAXIMUM STRENGTH) 60-1200 MG TB12   Oral   Take 1 tablet by mouth 2 (two) times daily.         [name] Dextromethorphan-Guaifenesin (MUCINEX DM PO)   Oral   Take by mouth daily as needed (cough and congestion). liquid         . Doxazosin Mesylate (CARDURA PO)   Oral   Take by mouth.         . Multiple Vitamin (MULTIVITAMIN) tablet   Oral   Take 1 tablet by mouth daily.           [name] warfarin (COUMADIN) 5 MG tablet   Oral   Take 5 mg by mouth daily.            BP 121/69  Temp(Src) 97.9 F (36.6 C) (Oral)  Resp 15  SpO2 95% Physical Exam  Nursing note and vitals reviewed. Constitutional: He is oriented to person, place, and time. He appears well-developed and well-nourished. No distress.  HENT:  Head: Normocephalic and atraumatic.  Mouth/Throat: No oropharyngeal exudate.  Eyes: EOM are normal. Pupils are equal, round, and reactive to light.  Neck: Normal range of motion. Neck supple.  Cardiovascular: Normal rate and regular rhythm.  Exam reveals no friction rub.   No murmur heard. Pulmonary/Chest: Effort normal and breath sounds normal. No respiratory distress. He has no wheezes. He has no rales.  Abdominal: Soft.  He exhibits no distension. There is no tenderness. There is no rebound.  Musculoskeletal: Normal range of motion. He exhibits no edema.  Neurological: He is alert and oriented to person, place, and time. No cranial nerve deficit. He exhibits normal muscle tone.  Skin: No rash noted. He is not diaphoretic.    ED Course  Procedures (including critical care time) Labs Review Labs Reviewed  CBC - Abnormal; Notable for the following:    RBC 4.10 (*)    Hemoglobin 12.5 (*)    HCT 36.8 (*)    Platelets 408 (*)    All other components within normal limits  PROTIME-INR - Abnormal; Notable for the following:    Prothrombin Time 38.3 (*)    INR 4.12 (*)    All other components within normal limits  POCT I-STAT  TROPONIN I - Abnormal; Notable for the following:    Troponin i, poc 0.16 (*)    All other components within normal limits  PRO B NATRIURETIC PEPTIDE  BASIC METABOLIC PANEL  URINALYSIS, ROUTINE W REFLEX MICROSCOPIC   Imaging Review Dg Chest 2 View  10/17/2013   CLINICAL DATA:  sob. a fib.  EXAM: CHEST  2 VIEW  COMPARISON:  DG CHEST 2V dated 07/10/2013; DG CHEST 2V dated 07/09/2012; DG CHEST 1V PORT dated 07/31/2010; DG CHEST 2 VIEW dated 02/11/2008  FINDINGS: There is bilateral chronic interstitial disease. There are bilateral calcified pleural plaques. There is no new focal consolidation, pleural effusion or pneumothorax. The heart and mediastinal contours are unremarkable. There is aortic atherosclerosis.  There is mild degenerative disc disease of the thoracic spine.  IMPRESSION: 1. No acute cardiopulmonary disease. 2. Bilateral pleural plaques and chronic interstitial disease as can be seen with prior asbestos exposure.   Electronically Signed   By: Kathreen Devoid   On: 10/17/2013 13:15    EKG Interpretation   None       Date: 10/17/2013  Rate: 112  Rhythm: atrial fibrillation and with RVR  QRS Axis: left  Intervals: normal  ST/T Wave abnormalities: ST depressions anteriorly and in V4/V5  Conduction Disutrbances:none  Narrative Interpretation:   Old EKG Reviewed: changes noted and ST depressions new   CRITICAL CARE Performed by: Osvaldo Shipper   Total critical care time: 30 minutes  Critical care time was exclusive of separately billable procedures and treating other patients.  Critical care was necessary to treat or prevent imminent or life-threatening deterioration.  Critical care was time spent personally by me on the following activities: development of treatment plan with patient and/or surrogate as well as nursing, discussions with consultants, evaluation of patient's response to treatment, examination of patient, obtaining history from patient or surrogate, ordering  and performing treatments and interventions, ordering and review of laboratory studies, ordering and review of radiographic studies, pulse oximetry and re-evaluation of patient's condition.  MDM   1. CHF exacerbation   2. Atrial fibrillation with RVR    80 year old male presents from urgent care clinic for concerns of CHF. He's had progressive shortness of breath over the past 2 weeks and over the past 24 hours is available shortness of breath with lying down. He denies any chest pain. He is in A. fib with RVR with rates between 110 and 1:30. He's on Coumadin for his A. Fib. Sent from [facility] for further eval. Here, relaxing comfortably, no chest pain. Afib on the monitor with rates in the 110s to the 130s. Lungs with mild rales at the bases, no  peripheral edema. Labs show mild elevation in troponin, supratherapeutic INR, BNP of 7408. Cards consulted for admission.     Osvaldo Shipper, MD 10/17/13 [phone]

## 2013-10-17 NOTE — Progress Notes (Signed)
ANTICOAGULATION CONSULT NOTE - Initial Consult  Pharmacy Consult for heparin Indication: afib and r/o acs  No Known Allergies  Patient Measurements:   Heparin Dosing Weight: 69kg  Vital Signs: Temp: 97.9 F (36.6 C) (01/03 1353) Temp src: Oral (01/03 1353) BP: 135/65 mmHg (01/03 1730) Pulse Rate: 121 (01/03 1730)  Labs:  Recent Labs  10/17/13 1416  HGB 12.5*  HCT 36.8*  PLT 408*  LABPROT 38.3*  INR 4.12*  CREATININE 0.83    The CrCl is unknown because both a height and weight (above a minimum accepted value) are required for this calculation.   Medical History: Past Medical History  Diagnosis Date  . Small bowel obstruction   . Atrial fibrillation     with rapid ventricular repsonse  . HTN (hypertension)   . Bone lesion     lytic  . Debility     Medications:  Warfarin 5mg  daily pta  Assessment: 80 year old male presents to [facility] with shortness of breath, currently in afib with history of afib on coumadin prior to admission. INR supratherapeutic at 4.1. No bleeding noted, cbc appears normal. CE's slightly positive. Orders received to hold warfarin for now and start IV heparin when INR trends down to <2. Daily INR ordered  Goal of Therapy:  Heparin level 0.3-0.7 units/ml Monitor platelets by anticoagulation protocol: Yes   Plan:  Daily INR - start IV heparin when <2  [name] Hearing PharmD., BCPS Clinical Pharmacist Pager [phone] 10/17/2013 5:46 PM

## 2013-10-17 NOTE — H&P (Signed)
CARDIOLOGY ADMISSION NOTE  Patient ID: [name] MRN: [id number] DOB/AGE: September 09, 1918 80 y.o.  Admit date: 10/17/2013 Primary Physician   Dr. Felipa Eth Primary Cardiologist   Dr. Radford Pax Chief Complaint    Dyspnea  HPI:  The patient is a very pleasant gentleman who looks much younger than his stated age. He does have a history of atrial fibrillation but apparently has not been in fibrillation for a couple of years. He gets along well. He takes his blood pressure routinely and has not noted his heart rate to be elevated. He was having some shortness of breath. This morning at 5 AM he was seen and is [facility] by [facility] nurse and was noted to have an oxygen saturation of 93% with a heart rate 68. However, because of his complaints and was advised he follow the primary provider and he went to urgent care where he was noted to have a rapid heart rate. He was sent to the emergency room where he has been in atrial fibrillation with a rapid rate treated with IV Cardizem. He has also been treated with IV Lasix. He said he had been having trouble flat.  He hasn't noticed his heart rate racing. He hasn't had any presyncope or syncope. He denies any chest pressure, neck or arm discomfort. He's had no weight gain or edema.  Of note his point-of-care troponin was minimally elevated. His BNP was 7408.   Past Medical History  Diagnosis Date  . Small bowel obstruction   . Atrial fibrillation     with rapid ventricular repsonse  . HTN (hypertension)   . Bone lesion     lytic  . Debility     Past Surgical History  Procedure Laterality Date  . Appendectomy    . Hernia repair      x2  . Parathyroidectomy      No Known Allergies No current facility-administered medications on file prior to encounter.   Current Outpatient Prescriptions on File Prior to Encounter  Medication Sig Dispense Refill  . Multiple Vitamin (MULTIVITAMIN) tablet Take 1 tablet by mouth daily.        [name] warfarin (COUMADIN) 5  MG tablet Take 5 mg by mouth every evening.        History   Social History  . Marital Status: Married    Spouse Name: N/A    Number of Children: N/A  . Years of Education: N/A   Occupational History  . retired    Social History Main Topics  . Smoking status: Former Research scientist (life sciences)  . Smokeless tobacco: Not on file     Comment: During WWII  . Alcohol Use: 4.2 oz/week    7 Glasses of wine per week  . Drug Use: No  . Sexual Activity: Not on file   Other Topics Concern  . Not on file   Social History Narrative   Retired from the railroad.  One child    No family history on file.   ROS:  As stated in the HPI and negative for all other systems.  Physical Exam: Blood pressure 136/67, pulse 91, temperature 97.9 F (36.6 C), temperature source Oral, resp. rate 26, height 5\' 8"  (1.727 m), weight 146 lb 1.6 oz (66.271 kg), SpO2 95.00%.  GENERAL:  Well appearing in no distress looking much younger than his stated age. HEENT:  Pupils equal round and reactive, fundi not visualized, oral mucosa unremarkable NECK:  No jugular venous distention, waveform within normal limits, carotid upstroke brisk and  symmetric, no bruits, no thyromegaly LYMPHATICS:  No cervical, inguinal adenopathy LUNGS:  Clear to auscultation bilaterally BACK:  No CVA tenderness CHEST:  Unremarkable HEART:  PMI not displaced or sustained,S1 and S2 within normal limits, no S3,  no clicks, no rubs, no murmurs, irregular ABD:  Flat, positive bowel sounds normal in frequency in pitch, no bruits, no rebound, no guarding, no midline pulsatile mass, no hepatomegaly, no splenomegaly EXT:  2 plus pulses throughout, no edema, no cyanosis no clubbing SKIN:  No rashes no nodules NEURO:  Cranial nerves II through XII grossly intact, motor grossly intact throughout PSYCH:  Cognitively intact, oriented to person place and time  Labs: Lab Results  Component Value Date   BUN 12 10/17/2013   Lab Results  Component Value Date    CREATININE 0.83 10/17/2013   Lab Results  Component Value Date   NA 138 10/17/2013   K 4.1 10/17/2013   CL 101 10/17/2013   CO2 25 10/17/2013    Lab Results  Component Value Date   WBC 7.8 10/17/2013   HGB 12.5* 10/17/2013   HCT 36.8* 10/17/2013   MCV 89.8 10/17/2013   PLT 408* 10/17/2013   No results found for this basename: CHOL,  HDL,  LDLCALC,  LDLDIRECT,  TRIG,  CHOLHDL   Lab Results  Component Value Date   ALT 19 08/18/2010   AST 20 08/18/2010   ALKPHOS 54 08/18/2010   BILITOT 0.7 08/18/2010      Radiology:  CXR:  1. No acute cardiopulmonary disease.  2. Bilateral pleural plaques and chronic interstitial disease as can  be seen with prior asbestos exposure.    EKG:  Atrial fib, rate 112,  incomplete right bundle branch block, left axis deviation, left anterior fascicular block, poor anterior R wave progression, diffuse inferolateral T wave inversion consistent with possible ischemia.   ASSESSMENT AND PLAN:    ATRIAL FIB:  The patient is in atrial fibrillation. This seems to be relatively new onset. He's also in heart failure which may have predated the fibrillation. His EKG would suggest some ischemic component. For now and continue the Cardizem. I'm going to be holding his warfarin and when his INR falls less than 2 he started on heparin. He should get an aspirin. I will check an echocardiogram.  I will be cycling cardiac enzymes and have a low threshold for further evaluation since his catheterization.  CHF:  He did receive his IV diuretic tonight and I will hold off on further diuresis. We can dose adjust his diuretic based on symptoms overnight when he is seen in the morning. As above we will be checking an echocardiogram.  Signed: Minus Breeding 10/17/2013, 7:33 PM

## 2013-10-17 NOTE — ED Notes (Signed)
Patient coming from Urgent Care. Patient has been experiencing SOB x2 days. Patient is in a-fib with a history. Patient is not complaining of pain, ubt states he has a "funny feeling." Patients MD states he has new onset CHF with crackles in bases. Patient is 94-95% on RA.

## 2013-10-17 NOTE — Progress Notes (Addendum)
Urgent [facility] [address] [place] [address] [phone]  Date:  10/17/2013   Name:  [name]   DOB:  [birth year]   MRN:  [id number]  PCP:  No primary provider on file.    Chief Complaint: Shortness of Breath   History of Present Illness:  [name] is a 80 y.o. very pleasant male patient who presents with the following:  He noted onset of chest congestion a couple of weeks ago.  He tried mucinex OTC.  However he then developed dizziness and nausea. This has resolved once he stopped using the mucinex.    He has a history of A fib- he has had this for about 3 years.  He is on coumadin for his a fib and on medications for rate control.  He does not have a cardiologist per his report.  His PCP is Dr. Felipa Eth.    Lives at [place]. The nurse there saw him today and recommended that he be seen He has not noted a fever.   He did have a cough, but this seems to be improved now.    He has not noted a sore throat or stuffy, runny nose.   No aches or chills.    No vomiting or diarrhea.   He is in chronic a fib per his report.  He is treated just medically, has not had an ablation.   No chest pain.   He has felt tired, and has noted some SOB just last night.   History of asbestosis of his lungs He also noted orthopnea just last night.  He had to sleep to sleep in a chair last night.  He normally sleeps flat without a problem  He is here today with his wife of 44 years. He is generally in very good health except for his a fib  Patient Active Problem List   Diagnosis Date Noted  . ESSENTIAL HYPERTENSION, BENIGN 08/21/2010  . ATRIAL FIBRILLATION 08/21/2010    Past Medical History  Diagnosis Date  . Small bowel obstruction   . Atrial fibrillation     with rapid ventricular repsonse  . HTN (hypertension)   . Bone lesion     lytic  . Debility     Past Surgical History  Procedure Laterality Date  . Appendectomy    . Hernia repair     x2  . Parathyroidectomy      History  Substance Use Topics  . Smoking status: Never Smoker   . Smokeless tobacco: Not on file  . Alcohol Use: 4.2 oz/week    7 Glasses of wine per week    No family history on file.  No Known Allergies  Medication list has been reviewed and updated.  Current Outpatient Prescriptions on File Prior to Visit  Medication Sig Dispense Refill  . warfarin (COUMADIN) 5 MG tablet Take 5 mg by mouth daily.        . calcium carbonate (TUMS) 500 MG chewable tablet Chew 1 tablet by mouth 3 (three) times daily.        [name] diltiazem (DILACOR XR) 180 MG 24 hr capsule Take 180 mg by mouth daily.        . metoprolol (LOPRESSOR) 100 MG tablet Take 100 mg by mouth 2 (two) times daily.        . Multiple Vitamin (MULTIVITAMIN) tablet Take 1 tablet by mouth daily.         No current facility-administered medications on  file prior to visit.    Review of Systems:  As per HPI- otherwise negative.   Physical Examination: Filed Vitals:   10/17/13 1154  BP: 130/80  Pulse: 116  Temp: 98 F (36.7 C)  Resp: 17   Filed Vitals:   10/17/13 1154  Height: 5' 3.5" (1.613 m)  Weight: 153 lb (69.4 kg)   Body mass index is 26.67 kg/(m^2). Ideal Body Weight: Weight in (lb) to have BMI = 25: 143.1  GEN: WDWN, NAD, Non-toxic, A & O x 3, looks well HEENT: Atraumatic, Normocephalic. Neck supple. No masses, No LAD.  Bilateral TM wnl, oropharynx normal.  PEERL,EOMI.   Ears and Nose: No external deformity. CV:No M/G/R. No JVD. No thrill. No extra heart sounds.  Tachycardic, irregularly irregular PULM: CTA B, no wheezes or rhonchi. No retractions. No resp. distress. No accessory muscle use. He does have crackles in his bilateral bases.   ABD: S, NT, ND EXTR: No c/c/e NEURO Normal gait.  PSYCH: Normally interactive. Conversant. Not depressed or anxious appearing.  Calm demeanor.  His left ankle is a little bit larger than the right but this is baseline per his report. Otherwise  no pedal edema  EKG: uncontrolled a fib with elevated rate and possible anterior ischemia.    [facility] reading (PRIMARY) by  Dr. Lorelei Pont. CXR: diffuse ?scarring that appears to be chronic with bilateral small effusions.   Per pt he has a history of asbestosis.    Assessment and Plan: Atrial fibrillation - Plan: EKG 12-Lead  SOB (shortness of breath) - Plan: DG Chest 2 View  Acute CHF  80 year old gentleman with acute CHF likely related to a fib with increased rate. Explained that he needs to be seen at the ER urgently via EMS.  At first he was hesitant but then did agree to transfer. IV placed for access, KVO. Will transfer to the ED for further evaluation and treatment, likely admission.   Signed Lamar Blinks, MD

## 2013-10-18 LAB — CBC
HCT: 36.9 % — ABNORMAL LOW (ref 39.0–52.0)
Hemoglobin: 12.6 g/dL — ABNORMAL LOW (ref 13.0–17.0)
MCH: 30.7 pg (ref 26.0–34.0)
MCHC: 34.1 g/dL (ref 30.0–36.0)
MCV: 89.8 fL (ref 78.0–100.0)
PLATELETS: 411 10*3/uL — AB (ref 150–400)
RBC: 4.11 MIL/uL — AB (ref 4.22–5.81)
RDW: 14.7 % (ref 11.5–15.5)
WBC: 7.9 10*3/uL (ref 4.0–10.5)

## 2013-10-18 LAB — TROPONIN I
Troponin I: 0.33 ng/mL (ref <0.30)
Troponin I: <0.3 ng/mL (ref <0.30)

## 2013-10-18 LAB — COMPREHENSIVE METABOLIC PANEL
ALT: 11 U/L (ref 0–53)
AST: 18 U/L (ref 0–37)
Albumin: 2.8 g/dL — ABNORMAL LOW (ref 3.5–5.2)
Alkaline Phosphatase: 50 U/L (ref 39–117)
BILIRUBIN TOTAL: 0.7 mg/dL (ref 0.3–1.2)
BUN: 9 mg/dL (ref 6–23)
CO2: 25 meq/L (ref 19–32)
CREATININE: 0.79 mg/dL (ref 0.50–1.35)
Calcium: 8.4 mg/dL (ref 8.4–10.5)
Chloride: 99 mEq/L (ref 96–112)
GFR calc Af Amer: 87 mL/min — ABNORMAL LOW (ref >90)
GFR, EST NON AFRICAN AMERICAN: 75 mL/min — AB (ref >90)
Glucose, Bld: 97 mg/dL (ref 70–99)
Potassium: 3.6 mEq/L — ABNORMAL LOW (ref 3.7–5.3)
Sodium: 136 mEq/L — ABNORMAL LOW (ref 137–147)
Total Protein: 6.3 g/dL (ref 6.0–8.3)

## 2013-10-18 LAB — PROTIME-INR
INR: 3.32 — AB (ref 0.00–1.49)
Prothrombin Time: 32.5 seconds — ABNORMAL HIGH (ref 11.6–15.2)

## 2013-10-18 LAB — TSH: TSH: 1.279 u[IU]/mL (ref 0.350–4.500)

## 2013-10-18 NOTE — Progress Notes (Signed)
CRITICAL VALUE ALERT  Critical value received: Troponin 0.33  Date of notification:  10/18/13  Time of notification:  0230  Critical value read back:Yes  Nurse who received alert:  Ladell Heads  MD notified (1st page):Turner  Time of first page: 0320  MD notified (2nd page): Turner  Time of second page:0430  Responding MD:  Radford Pax  Time MD responded:  [phone]

## 2013-10-18 NOTE — Progress Notes (Signed)
  Echocardiogram 2D Echocardiogram has been performed.  Mauricio Po 10/18/2013, 3:53 PM

## 2013-10-18 NOTE — Progress Notes (Signed)
SUBJECTIVE:  He had no complaints overnight.  He has had no chest pain.   PHYSICAL EXAM Filed Vitals:   10/18/13 0400 10/18/13 0429 10/18/13 0500 10/18/13 0600  BP: 116/65 116/65 122/78 118/78  Pulse: 93 97 91 168  Temp:  98 F (36.7 C)    TempSrc:  Oral    Resp: 22 17 21 16   Height:      Weight:  144 lb 2.9 oz (65.4 kg)    SpO2: 94% 95% 96% 95%   General:  No distress Lungs:  Basilar rales Heart:  Irregular Abdomen:  Positive bowel sounds, no rebound no guarding Extremities:  No edema  LABS: Lab Results  Component Value Date   TROPONINI 0.33* 10/18/2013   Results for orders placed during the hospital encounter of 10/17/13 (from the past 24 hour(s))  CBC     Status: Abnormal   Collection Time    10/17/13  2:16 PM      Result Value Range   WBC 7.8  4.0 - 10.5 K/uL   RBC 4.10 (*) 4.22 - 5.81 MIL/uL   Hemoglobin 12.5 (*) 13.0 - 17.0 g/dL   HCT 36.8 (*) 39.0 - 52.0 %   MCV 89.8  78.0 - 100.0 fL   MCH 30.5  26.0 - 34.0 pg   MCHC 34.0  30.0 - 36.0 g/dL   RDW 14.6  11.5 - 15.5 %   Platelets 408 (*) 150 - 400 K/uL  PRO B NATRIURETIC PEPTIDE     Status: Abnormal   Collection Time    10/17/13  2:16 PM      Result Value Range   Pro B Natriuretic peptide (BNP) 7408.0 (*) 0 - 450 pg/mL  BASIC METABOLIC PANEL     Status: Abnormal   Collection Time    10/17/13  2:16 PM      Result Value Range   Sodium 138  137 - 147 mEq/L   Potassium 4.1  3.7 - 5.3 mEq/L   Chloride 101  96 - 112 mEq/L   CO2 25  19 - 32 mEq/L   Glucose, Bld 102 (*) 70 - 99 mg/dL   BUN 12  6 - 23 mg/dL   Creatinine, Ser 0.83  0.50 - 1.35 mg/dL   Calcium 8.7  8.4 - 10.5 mg/dL   GFR calc non Af Amer 74 (*) >90 mL/min   GFR calc Af Amer 85 (*) >90 mL/min  PROTIME-INR     Status: Abnormal   Collection Time    10/17/13  2:16 PM      Result Value Range   Prothrombin Time 38.3 (*) 11.6 - 15.2 seconds   INR 4.12 (*) 0.00 - 1.49  POCT I-STAT TROPONIN I     Status: Abnormal   Collection Time    10/17/13   2:44 PM      Result Value Range   Troponin i, poc 0.16 (*) 0.00 - 0.08 ng/mL   Comment NOTIFIED PHYSICIAN     Comment 3           URINALYSIS, ROUTINE W REFLEX MICROSCOPIC     Status: Abnormal   Collection Time    10/17/13  3:33 PM      Result Value Range   Color, Urine YELLOW  YELLOW   APPearance CLEAR  CLEAR   Specific Gravity, Urine 1.018  1.005 - 1.030   pH 6.0  5.0 - 8.0   Glucose, UA NEGATIVE  NEGATIVE mg/dL   Hgb urine dipstick  NEGATIVE  NEGATIVE   Bilirubin Urine NEGATIVE  NEGATIVE   Ketones, ur 40 (*) NEGATIVE mg/dL   Protein, ur NEGATIVE  NEGATIVE mg/dL   Urobilinogen, UA 1.0  0.0 - 1.0 mg/dL   Nitrite NEGATIVE  NEGATIVE   Leukocytes, UA NEGATIVE  NEGATIVE  MRSA PCR SCREENING     Status: None   Collection Time    10/17/13  6:52 PM      Result Value Range   MRSA by PCR NEGATIVE  NEGATIVE  TROPONIN I     Status: None   Collection Time    10/17/13  8:00 PM      Result Value Range   Troponin I <0.30  <0.30 ng/mL  TSH     Status: None   Collection Time    10/17/13  8:00 PM      Result Value Range   TSH 1.279  0.350 - 4.500 uIU/mL  TROPONIN I     Status: Abnormal   Collection Time    10/18/13  1:00 AM      Result Value Range   Troponin I 0.33 (*) <0.30 ng/mL    Intake/Output Summary (Last 24 hours) at 10/18/13 0748 Last data filed at 10/18/13 0600  Gross per 24 hour  Intake 783.33 ml  Output    775 ml  Net   8.33 ml    EKG:  Atrial fib. T wave changes unchanged from yesterday.  10/18/2013  ASSESSMENT AND PLAN:   ATRIAL FIB:  I will try to switch to po Cardizem.  Holding warfarin.    CHF:  I would like to give a low dose of diuretic this AM.    NQWMI:  I had initially discussed cath with the patient. However, the troponin elevation is minimal.  I will await the results of the echo.  If is has a very normal echo, risk stratification with Lexiscan Myoview might be most prudent.  If however, is EF is reduced I would proceed with cath.  Either way he is NPO and  warfarin is held.    Jeneen Rinks [facility] 10/18/2013 7:48 AM

## 2013-10-18 NOTE — Progress Notes (Signed)
ANTICOAGULATION CONSULT NOTE - Initial Consult  Pharmacy Consult for heparin Indication: afib and r/o acs  No Known Allergies  Patient Measurements: Height: 5\' 8"  (172.7 cm) Weight: 144 lb 2.9 oz (65.4 kg) IBW/kg (Calculated) : 68.4 Heparin Dosing Weight: 69kg  Vital Signs: Temp: 98 F (36.7 C) (01/04 0800) Temp src: Oral (01/04 0800) BP: 120/58 mmHg (01/04 0800) Pulse Rate: 116 (01/04 0800)  Labs:  Recent Labs  10/17/13 1416 10/17/13 2000 10/18/13 0100 10/18/13 0830  HGB 12.5*  --   --  12.6*  HCT 36.8*  --   --  36.9*  PLT 408*  --   --  411*  LABPROT 38.3*  --   --  32.5*  INR 4.12*  --   --  3.32*  CREATININE 0.83  --   --  0.79  TROPONINI  --  <0.30 0.33* <0.30    Estimated Creatinine Clearance: 53.4 ml/min (by C-G formula based on Cr of 0.79).   Medical History: Past Medical History  Diagnosis Date  . Small bowel obstruction   . Atrial fibrillation     with rapid ventricular repsonse  . HTN (hypertension)   . Bone lesion     lytic  . Debility     Medications:  Warfarin 5mg  daily pta  Assessment: 80 year old male presents to [facility] with shortness of breath, currently in afib with history of afib on coumadin prior to admission. INR supratherapeutic at 4.1 upon admission. Orders were received to hold warfarin for now and start IV heparin gtt when INR trends down to <2.  H/H low but stable and plt are elevated at 411.  INR has fallen to 3.32 this AM.  No reports of bleeding  Goal of Therapy:  Heparin level 0.3-0.7 units/ml Monitor platelets by anticoagulation protocol: Yes   Plan:  - continuing to hold warfarin  - daily INR and CBC - plan to begin heparin IV gtt when INR falls <2  Ovid Curd E. Jacqlyn Larsen, PharmD Clinical Pharmacist - Resident Pager: [phone] Pharmacy: [phone] 10/18/2013 10:37 AM

## 2013-10-19 LAB — CBC
HCT: 38.2 % — ABNORMAL LOW (ref 39.0–52.0)
Hemoglobin: 13.1 g/dL (ref 13.0–17.0)
MCH: 30.5 pg (ref 26.0–34.0)
MCHC: 34.3 g/dL (ref 30.0–36.0)
MCV: 89 fL (ref 78.0–100.0)
PLATELETS: 441 10*3/uL — AB (ref 150–400)
RBC: 4.29 MIL/uL (ref 4.22–5.81)
RDW: 14.9 % (ref 11.5–15.5)
WBC: 8.6 10*3/uL (ref 4.0–10.5)

## 2013-10-19 LAB — PROTIME-INR
INR: 2.57 — ABNORMAL HIGH (ref 0.00–1.49)
INR: 1.74 — ABNORMAL HIGH (ref 0.00–1.49)
PROTHROMBIN TIME: 26.7 s — AB (ref 11.6–15.2)
PROTHROMBIN TIME: 19.8 s — AB (ref 11.6–15.2)

## 2013-10-19 MED ORDER — SODIUM CHLORIDE 0.9 % IJ SOLN
3.0000 mL | Freq: Two times a day (BID) | INTRAMUSCULAR | Status: DC
  Administered 2013-10-20: 3 mL via INTRAVENOUS

## 2013-10-19 MED ORDER — DILTIAZEM HCL ER COATED BEADS 360 MG PO CP24
360.0000 mg | ORAL_CAPSULE | Freq: Every day | ORAL | Status: DC
  Administered 2013-10-19 – 2013-10-20 (×2): 360 mg via ORAL
  Filled 2013-10-19 (×4): qty 1

## 2013-10-19 MED ORDER — SODIUM CHLORIDE 0.9 % IV SOLN: 250.0000 mL | INTRAVENOUS | Status: DC | PRN

## 2013-10-19 MED ORDER — FUROSEMIDE 10 MG/ML IJ SOLN
20.0000 mg | Freq: Once | INTRAMUSCULAR | Status: AC
  Administered 2013-10-19: 20 mg via INTRAVENOUS
  Filled 2013-10-19: qty 2

## 2013-10-19 MED ORDER — SODIUM CHLORIDE 0.9 % IV SOLN
INTRAVENOUS | Status: DC
  Administered 2013-10-20: 20 mL/h via INTRAVENOUS

## 2013-10-19 MED ORDER — VITAMIN K1 10 MG/ML IJ SOLN
1.0000 mg | Freq: Once | INTRAMUSCULAR | Status: AC
  Administered 2013-10-19: 1 mg via INTRAVENOUS
  Filled 2013-10-19: qty 0.1

## 2013-10-19 MED ORDER — HEPARIN (PORCINE) IN NACL 100-0.45 UNIT/ML-% IJ SOLN
1000.0000 [IU]/h | INTRAMUSCULAR | Status: DC
  Administered 2013-10-19: 800 [IU]/h via INTRAVENOUS
  Administered 2013-10-20: 1000 [IU]/h via INTRAVENOUS
  Filled 2013-10-19 (×2): qty 250

## 2013-10-19 MED ORDER — SODIUM CHLORIDE 0.9 % IJ SOLN: 3.0000 mL | INTRAMUSCULAR | Status: DC | PRN

## 2013-10-19 MED ORDER — ASPIRIN 81 MG PO CHEW
81.0000 mg | CHEWABLE_TABLET | ORAL | Status: AC
Start: 2013-10-20 — End: 2013-10-20
  Administered 2013-10-20: 81 mg via ORAL
  Filled 2013-10-19: qty 1

## 2013-10-19 NOTE — Progress Notes (Signed)
Utilization Review Completed.  

## 2013-10-19 NOTE — Progress Notes (Signed)
ANTICOAGULATION CONSULT NOTE - Initial Consult  Pharmacy Consult for heparin Indication: afib and r/o acs  No Known Allergies  Patient Measurements: Height: 5\' 8"  (172.7 cm) Weight: 145 lb 11.6 oz (66.1 kg) IBW/kg (Calculated) : 68.4 Heparin Dosing Weight: 66 kg  Vital Signs: Temp: 97.6 F (36.4 C) (01/05 2016) Temp src: Oral (01/05 2016) BP: 113/49 mmHg (01/05 2016) Pulse Rate: 117 (01/05 2016)  Labs:  Recent Labs  10/17/13 1416 10/17/13 2000 10/18/13 0100 10/18/13 0830 10/19/13 0615 10/19/13 2047  HGB 12.5*  --   --  12.6* 13.1  --   HCT 36.8*  --   --  36.9* 38.2*  --   PLT 408*  --   --  411* 441*  --   LABPROT 38.3*  --   --  32.5* 26.7* 19.8*  INR 4.12*  --   --  3.32* 2.57* 1.74*  CREATININE 0.83  --   --  0.79  --   --   TROPONINI  --  <0.30 0.33* <0.30  --   --     Estimated Creatinine Clearance: 53.9 ml/min (by C-G formula based on Cr of 0.79).   Medical History: Past Medical History  Diagnosis Date  . Small bowel obstruction   . Atrial fibrillation     with rapid ventricular repsonse  . HTN (hypertension)   . Bone lesion     lytic  . Debility     Medications:  Warfarin 5mg  daily pta  Assessment: 80 year old male on coumadin PTA for afib, coumadin has been on hold since admission. Plan is to start IV heparin for ACS when INR trends down to <2. Cardiac cath scheduled for 1/6. INR dropped to 1.74 this evening. Hgb and plt are stable.  Goal of Therapy:  Heparin level 0.3-0.7 units/ml Monitor platelets by anticoagulation protocol: Yes   Plan:  - Start IV heparin without bolus 800 units/hr - f/u 8 hr heparin level at 6AM  - f/u plans for anticoaglation after cath.  Maryanna Shape, PharmD, BCPS  Clinical Pharmacist  Pager: [phone]   10/19/2013 10:01 PM

## 2013-10-19 NOTE — Progress Notes (Signed)
Subjective: No further dyspnea.  Never had CP.[name] No orthopnea last night.  He is anxious to go home.    Objective: Vital signs in last 24 hours: Temp:  [97.6 F (36.4 C)-98.1 F (36.7 C)] 98 F (36.7 C) (01/05 0417) Pulse Rate:  [67-112] 95 (01/05 0600) Resp:  [12-32] 12 (01/05 0600) BP: (100-124)/(46-91) 114/73 mmHg (01/05 0600) SpO2:  [91 %-96 %] 96 % (01/05 0600) Weight:  [145 lb 11.6 oz (66.1 kg)] 145 lb 11.6 oz (66.1 kg) (01/05 0417) Last BM Date: 10/16/13  Intake/Output from previous day: 01/04 0701 - 01/05 0700 In: 813 [P.O.:480; I.V.:333] Out: 875 [Urine:875] Intake/Output this shift:    Medications Current Facility-Administered Medications  Medication Dose Route Frequency Provider Last Rate Last Dose  . 0.9 %  sodium chloride infusion  250 mL Intravenous PRN Dayna N Dunn, PA-C 10 mL/hr at 10/19/13 0516 10 mL at 10/19/13 0516  . acetaminophen (TYLENOL) tablet 650 mg  650 mg Oral Q4H PRN Dayna N Dunn, PA-C      . aspirin EC tablet 81 mg  81 mg Oral Daily Dayna N Dunn, PA-C   81 mg at 10/18/13 0936  . dextromethorphan-guaiFENesin (MUCINEX DM) 30-600 MG per 12 hr tablet 2 tablet  2 tablet Oral BID PRN Minus Breeding, MD      . diltiazem (CARDIZEM) 100 mg in dextrose 5 % 100 mL infusion  5-15 mg/hr Intravenous Titrated Dayna N Dunn, PA-C 15 mL/hr at 10/19/13 0643 15 mg/hr at 10/19/13 0643  . multivitamin with minerals tablet 1 tablet  1 tablet Oral Daily Minus Breeding, MD   1 tablet at 10/18/13 0936  . nitroGLYCERIN (NITROSTAT) SL tablet 0.4 mg  0.4 mg Sublingual Q5 Min x 3 PRN Dayna N Dunn, PA-C      . ondansetron (ZOFRAN) injection 4 mg  4 mg Intravenous Q6H PRN Dayna N Dunn, PA-C      . sodium chloride 0.9 % injection 3 mL  3 mL Intravenous Q12H Dayna N Dunn, PA-C   3 mL at 10/18/13 2130  . sodium chloride 0.9 % injection 3 mL  3 mL Intravenous PRN Charlie Pitter, PA-C        PE: General appearance: alert, cooperative and no distress Lungs: mild L>R rales.  No  wheeze. Heart: irregularly irregular rhythm Extremities: 1+ ankle edema Pulses: 2+ and symmetric Skin: Warm and dry. Neurologic: Grossly normal  Lab Results:   Recent Labs  10/17/13 1416 10/18/13 0830 10/19/13 0615  WBC 7.8 7.9 8.6  HGB 12.5* 12.6* 13.1  HCT 36.8* 36.9* 38.2*  PLT 408* 411* 441*   BMET  Recent Labs  10/17/13 1416 10/18/13 0830  NA 138 136*  K 4.1 3.6*  CL 101 99  CO2 25 25  GLUCOSE 102* 97  BUN 12 9  CREATININE 0.83 0.79  CALCIUM 8.7 8.4   PT/INR  Recent Labs  10/17/13 1416 10/18/13 0830 10/19/13 0615  LABPROT 38.3* 32.5* 26.7*  INR 4.12* 3.32* 2.57*   Lipid Panel  No results found for this basename: chol, trig, hdl, cholhdl, vldl, ldlcalc   Cardiac Panel (last 3 results)  Recent Labs  10/17/13 2000 10/18/13 0100 10/18/13 0830  TROPONINI <0.30 0.33* <0.30     Studies/Results: 2D echo,  Study Conclusions  - Left ventricle: The cavity size was normal. Systolic function was moderately reduced. The estimated ejection fraction was in the range of 35% to 40%. Moderate diffuse hypokinesis. Possible severe hypokinesis of the mid-distalanteroseptal and apical  myocardium. - Aortic valve: Trivial regurgitation. - Mitral valve: Moderate regurgitation. - Left atrium: The atrium was moderately to severely dilated. - Pulmonary arteries: Systolic pressure was mildly to moderately increased. PA peak pressure: 75mm Hg (S).   Assessment/Plan   Active Problems:   Atrial fibrillation   NSTEMI (non-ST elevated myocardial infarction)   Cardiomyopathy EF 35-40%  Plan:  EF is 35-40% with moderate diffuse hypokinesis.  Previous EF was normal.  Plan for LHC due to this new finding and NSTEMI.  Peak troponin was minimal at 0.33.  INR is 2.57 today.  Warfarin was held.  Will need to wait for INR to decrease further.  IV heparin when INR < 2.0.   Net fluids:  -0.06L after a dose of IV lasix yesterday.   Maintaining afib with a rate in the 90's.   Will change from 15mg  /hr IV dilt to PO cardizem 360mg .   I discussed sodium restriction.     LOS: 2 days    HAGER, BRYAN 10/19/2013 8:03 AM  As above, patient seen and examined. He is without complaints this morning. He remains in atrial fibrillation. His heart rate is mildly increased. Will change Cardizem to by mouth. May need additional medications for rate control. If he remains dyspneic despite rate control then he may require TEE guided cardioversion after cath. His LV function is reduced. As outlined by Dr. Percival Spanish, plan cardiac catheterization. The risks and benefits were discussed and the patient agrees to proceed. I will give 1 mg of vitamin K today. If INR is less than 1.8 in the morning we will proceed. He has mild crackles on examination. I will give 1 dose of Lasix today. Recheck potassium and renal function tomorrow morning. Kirk Ruths 10:55 AM

## 2013-10-20 ENCOUNTER — Other Ambulatory Visit: Payer: Self-pay | Admitting: *Deleted

## 2013-10-20 ENCOUNTER — Inpatient Hospital Stay (HOSPITAL_COMMUNITY): Payer: Medicare Other

## 2013-10-20 ENCOUNTER — Encounter (HOSPITAL_COMMUNITY)
Admission: EM | Disposition: A | Discharge status: Skilled Nursing Facility | Payer: Self-pay | Source: Home / Self Care | Attending: Cardiothoracic Surgery

## 2013-10-20 DIAGNOSIS — I2510 Atherosclerotic heart disease of native coronary artery without angina pectoris: Secondary | ICD-10-CM

## 2013-10-20 DIAGNOSIS — Z01810 Encounter for preprocedural cardiovascular examination: Secondary | ICD-10-CM

## 2013-10-20 DIAGNOSIS — Z8709 Personal history of other diseases of the respiratory system: Secondary | ICD-10-CM

## 2013-10-20 HISTORY — PX: LEFT HEART CATHETERIZATION WITH CORONARY ANGIOGRAM: SHX5451

## 2013-10-20 LAB — BASIC METABOLIC PANEL
BUN: 13 mg/dL (ref 6–23)
CALCIUM: 8.4 mg/dL (ref 8.4–10.5)
CO2: 27 meq/L (ref 19–32)
Chloride: 100 mEq/L (ref 96–112)
Creatinine, Ser: 0.81 mg/dL (ref 0.50–1.35)
GFR calc Af Amer: 86 mL/min — ABNORMAL LOW (ref >90)
GFR calc non Af Amer: 74 mL/min — ABNORMAL LOW (ref >90)
GLUCOSE: 97 mg/dL (ref 70–99)
Potassium: 3.5 mEq/L — ABNORMAL LOW (ref 3.7–5.3)
SODIUM: 138 meq/L (ref 137–147)

## 2013-10-20 LAB — CBC
HCT: 36.7 % — ABNORMAL LOW (ref 39.0–52.0)
Hemoglobin: 12.3 g/dL — ABNORMAL LOW (ref 13.0–17.0)
MCH: 30.4 pg (ref 26.0–34.0)
MCHC: 33.5 g/dL (ref 30.0–36.0)
MCV: 90.8 fL (ref 78.0–100.0)
Platelets: 431 10*3/uL — ABNORMAL HIGH (ref 150–400)
RBC: 4.04 MIL/uL — AB (ref 4.22–5.81)
RDW: 14.8 % (ref 11.5–15.5)
WBC: 7.8 10*3/uL (ref 4.0–10.5)

## 2013-10-20 LAB — PROTIME-INR
INR: 1.41 (ref 0.00–1.49)
PROTHROMBIN TIME: 16.9 s — AB (ref 11.6–15.2)

## 2013-10-20 LAB — PULMONARY FUNCTION TEST
FEF 25-75 Post: 1.22 L/sec
FEF 25-75 Pre: 0.71 L/sec
FEF2575-%Change-Post: 71 %
FEF2575-%Pred-Post: 116 %
FEF2575-%Pred-Pre: 68 %
FEV1-%Change-Post: 27 %
FEV1-%Pred-Post: 77 %
FEV1-%Pred-Pre: 61 %
FEV1-Post: 1.5 L
FEV1-Pre: 1.18 L
FEV1FVC-%Change-Post: 19 %
FEV1FVC-%Pred-Pre: 86 %
FEV6-%Change-Post: 5 %
FEV6-%Pred-Post: 77 %
FEV6-%Pred-Pre: 74 %
FEV6-Post: 2.07 L
FEV6-Pre: 1.97 L
FEV6FVC-%Change-Post: -1 %
FEV6FVC-%Pred-Post: 107 %
FEV6FVC-%Pred-Pre: 109 %
FVC-%Change-Post: 7 %
FVC-%Pred-Post: 72 %
FVC-%Pred-Pre: 67 %
FVC-Post: 2.11 L
FVC-Pre: 1.97 L
Post FEV1/FVC ratio: 71 %
Post FEV6/FVC ratio: 98 %
Pre FEV1/FVC ratio: 60 %
Pre FEV6/FVC Ratio: 100 %

## 2013-10-20 LAB — POCT ACTIVATED CLOTTING TIME: Activated Clotting Time: 88 seconds

## 2013-10-20 LAB — ABO/RH: ABO/RH(D): O POS

## 2013-10-20 LAB — HEPARIN LEVEL (UNFRACTIONATED): Heparin Unfractionated: <0.1 IU/mL — ABNORMAL LOW (ref 0.30–0.70)

## 2013-10-20 SURGERY — LEFT HEART CATHETERIZATION WITH CORONARY ANGIOGRAM
Anesthesia: LOCAL

## 2013-10-20 MED ORDER — SODIUM CHLORIDE 0.9 % IV SOLN: INTRAVENOUS | Status: DC

## 2013-10-20 MED ORDER — NITROGLYCERIN 0.2 MG/ML ON CALL CATH LAB
INTRAVENOUS | Status: AC
  Filled 2013-10-20: qty 1

## 2013-10-20 MED ORDER — EPINEPHRINE HCL 1 MG/ML IJ SOLN
0.5000 ug/min | INTRAMUSCULAR | Status: DC
  Filled 2013-10-20: qty 4

## 2013-10-20 MED ORDER — DEXTROSE 5 % IV SOLN
1.5000 g | INTRAVENOUS | Status: AC
  Administered 2013-10-21: .75 g via INTRAVENOUS
  Administered 2013-10-21: 1.5 g via INTRAVENOUS
  Filled 2013-10-20: qty 1.5

## 2013-10-20 MED ORDER — TEMAZEPAM 15 MG PO CAPS: 15.0000 mg | ORAL_CAPSULE | Freq: Once | ORAL | Status: AC | PRN

## 2013-10-20 MED ORDER — BISACODYL 5 MG PO TBEC: 5.0000 mg | DELAYED_RELEASE_TABLET | Freq: Once | ORAL | Status: DC

## 2013-10-20 MED ORDER — POTASSIUM CHLORIDE 2 MEQ/ML IV SOLN
80.0000 meq | INTRAVENOUS | Status: DC
  Filled 2013-10-20: qty 40

## 2013-10-20 MED ORDER — MAGNESIUM SULFATE 50 % IJ SOLN
40.0000 meq | INTRAMUSCULAR | Status: DC
  Filled 2013-10-20: qty 10

## 2013-10-20 MED ORDER — HEPARIN (PORCINE) IN NACL 100-0.45 UNIT/ML-% IJ SOLN
1100.0000 [IU]/h | INTRAMUSCULAR | Status: DC
  Administered 2013-10-20: 1000 [IU]/h via INTRAVENOUS
  Filled 2013-10-20: qty 250

## 2013-10-20 MED ORDER — NITROGLYCERIN IN D5W 200-5 MCG/ML-% IV SOLN
2.0000 ug/min | INTRAVENOUS | Status: AC
  Administered 2013-10-21: 5 ug/min via INTRAVENOUS
  Filled 2013-10-20: qty 250

## 2013-10-20 MED ORDER — METOPROLOL TARTRATE 12.5 MG HALF TABLET
12.5000 mg | ORAL_TABLET | Freq: Two times a day (BID) | ORAL | Status: DC
Start: 2013-10-20 — End: 2013-10-21
  Administered 2013-10-20: 12.5 mg via ORAL
  Filled 2013-10-20 (×4): qty 1

## 2013-10-20 MED ORDER — PHENYLEPHRINE HCL 10 MG/ML IJ SOLN
30.0000 ug/min | INTRAVENOUS | Status: AC
  Administered 2013-10-21: 25 ug/min via INTRAVENOUS
  Filled 2013-10-20: qty 2

## 2013-10-20 MED ORDER — SODIUM CHLORIDE 0.9 % IV SOLN
INTRAVENOUS | Status: AC
  Administered 2013-10-21: 70 mL via INTRAVENOUS
  Filled 2013-10-20 (×2): qty 40

## 2013-10-20 MED ORDER — POTASSIUM CHLORIDE CRYS ER 20 MEQ PO TBCR
40.0000 meq | EXTENDED_RELEASE_TABLET | Freq: Once | ORAL | Status: AC
  Administered 2013-10-20: 40 meq via ORAL
  Filled 2013-10-20: qty 2

## 2013-10-20 MED ORDER — CEFUROXIME SODIUM 750 MG IJ SOLR
750.0000 mg | INTRAMUSCULAR | Status: DC
  Filled 2013-10-20: qty 750

## 2013-10-20 MED ORDER — SODIUM CHLORIDE 0.9 % IV SOLN
INTRAVENOUS | Status: DC
  Filled 2013-10-20: qty 30

## 2013-10-20 MED ORDER — HEPARIN (PORCINE) IN NACL 2-0.9 UNIT/ML-% IJ SOLN
INTRAMUSCULAR | Status: AC
  Filled 2013-10-20: qty 1000

## 2013-10-20 MED ORDER — ALBUTEROL SULFATE (2.5 MG/3ML) 0.083% IN NEBU
2.5000 mg | INHALATION_SOLUTION | Freq: Once | RESPIRATORY_TRACT | Status: AC
  Administered 2013-10-20: 2.5 mg via RESPIRATORY_TRACT

## 2013-10-20 MED ORDER — BISACODYL 5 MG PO TBEC
5.0000 mg | DELAYED_RELEASE_TABLET | Freq: Once | ORAL | Status: AC
  Administered 2013-10-20: 5 mg via ORAL
  Filled 2013-10-20: qty 1

## 2013-10-20 MED ORDER — MIDAZOLAM HCL 2 MG/2ML IJ SOLN
INTRAMUSCULAR | Status: AC
  Filled 2013-10-20: qty 2

## 2013-10-20 MED ORDER — NITROGLYCERIN IN D5W 200-5 MCG/ML-% IV SOLN
2.0000 ug/min | INTRAVENOUS | Status: DC
  Administered 2013-10-20: 2 ug/min via INTRAVENOUS
  Filled 2013-10-20: qty 250

## 2013-10-20 MED ORDER — SODIUM CHLORIDE 0.9 % IV SOLN
INTRAVENOUS | Status: DC
  Administered 2013-10-20: 12:00:00 via INTRAVENOUS

## 2013-10-20 MED ORDER — LIDOCAINE HCL (PF) 1 % IJ SOLN
INTRAMUSCULAR | Status: AC
  Filled 2013-10-20: qty 30

## 2013-10-20 MED ORDER — ONDANSETRON HCL 4 MG/2ML IJ SOLN
4.0000 mg | Freq: Four times a day (QID) | INTRAMUSCULAR | Status: DC | PRN
Start: 2013-10-20 — End: 2013-10-21

## 2013-10-20 MED ORDER — FENTANYL CITRATE 0.05 MG/ML IJ SOLN
INTRAMUSCULAR | Status: AC
Start: 2013-10-20 — End: 2013-10-20
  Filled 2013-10-20: qty 2

## 2013-10-20 MED ORDER — SODIUM CHLORIDE 0.9 % IV SOLN
INTRAVENOUS | Status: AC
  Administered 2013-10-21: 1 [IU]/h via INTRAVENOUS
  Filled 2013-10-20: qty 1

## 2013-10-20 MED ORDER — DEXMEDETOMIDINE HCL IN NACL 400 MCG/100ML IV SOLN
0.1000 ug/kg/h | INTRAVENOUS | Status: AC
  Administered 2013-10-21: 0.2 ug/kg/h via INTRAVENOUS
  Filled 2013-10-20: qty 100

## 2013-10-20 MED ORDER — SODIUM CHLORIDE 0.9 % IJ SOLN: 3.0000 mL | INTRAMUSCULAR | Status: DC | PRN

## 2013-10-20 MED ORDER — PLASMA-LYTE 148 IV SOLN
INTRAVENOUS | Status: AC
  Administered 2013-10-21: 08:00:00
  Filled 2013-10-20: qty 2.5

## 2013-10-20 MED ORDER — VANCOMYCIN HCL 10 G IV SOLR
1250.0000 mg | INTRAVENOUS | Status: AC
  Administered 2013-10-21: 1250 mg via INTRAVENOUS
  Filled 2013-10-20: qty 1250

## 2013-10-20 MED ORDER — TEMAZEPAM 15 MG PO CAPS: 15.0000 mg | ORAL_CAPSULE | Freq: Once | ORAL | Status: DC | PRN

## 2013-10-20 MED ORDER — SODIUM CHLORIDE 0.9 % IJ SOLN
3.0000 mL | Freq: Two times a day (BID) | INTRAMUSCULAR | Status: DC
  Administered 2013-10-20: 3 mL via INTRAVENOUS

## 2013-10-20 MED ORDER — ACETAMINOPHEN 325 MG PO TABS: 650.0000 mg | ORAL_TABLET | ORAL | Status: DC | PRN

## 2013-10-20 MED ORDER — METOPROLOL TARTRATE 12.5 MG HALF TABLET: 12.5000 mg | ORAL_TABLET | Freq: Once | ORAL | Status: DC

## 2013-10-20 MED ORDER — SODIUM CHLORIDE 0.9 % IV SOLN: 250.0000 mL | INTRAVENOUS | Status: DC | PRN

## 2013-10-20 MED ORDER — DOPAMINE-DEXTROSE 3.2-5 MG/ML-% IV SOLN
2.0000 ug/kg/min | INTRAVENOUS | Status: AC
  Administered 2013-10-21: 3 ug/kg/min via INTRAVENOUS
  Filled 2013-10-20: qty 250

## 2013-10-20 MED ORDER — METOPROLOL TARTRATE 12.5 MG HALF TABLET
12.5000 mg | ORAL_TABLET | Freq: Once | ORAL | Status: AC
  Administered 2013-10-21: 12.5 mg via ORAL
  Filled 2013-10-20: qty 1

## 2013-10-20 NOTE — Consult Note (Signed)
[address]       [place] [address]             [phone]        [name] [facility]ters Medical Record #[id number] Date of Birth: 1918/05/15  Referring: No ref. provider found Primary Care: No PCP Per Patient  Chief Complaint:    Chief Complaint  Patient presents with  . Shortness of Breath  . Atrial Fibrillation    History of Present Illness:     The patient is 80 yo who looks  younger than his stated age. He has been active exercising daily until about 10 days ago He has  a history of atrial fibrillation on coumadin. He is not sure if he has been in chronic afib or just rate contolled, last seen by Dr Caryl Comes 2011, was to have cardioversion but not clear if this was done.[name] He gets along well.  He was having some shortness of breath.  The morning of admission at 5 AM he was seen and is [facility] by a nurse and was noted to have an oxygen saturation of 93% with a heart rate 68. However, because of his complaints and was advised he follow the primary provider and he went to urgent care where he was noted to have a rapid heart rate. He was sent to the emergency room where he has been in atrial fibrillation with a rapid rate treated with IV Cardizem. He has also been treated with IV Lasix. He said he had been having trouble flat. He denies any chest pressure, neck or arm discomfort. He's had no weight gain or edema. Of note his point-of-care troponin was minimally elevated. His BNP was 7408  Patient admitted 1/3 and cardiac cath done today 1/6  Current Activity/ Functional Status: Patient is independent with mobility/ambulation, transfers, ADL's, IADL's.   Zubrod Score: At the time of surgery this patient's most appropriate activity status/level should be described as: []  Normal activity, no symptoms []  Symptoms, fully ambulatory []  Symptoms, in bed less than or equal to 50% of the time []  Symptoms, in bed greater than 50% of the time but less than  100% []  Bedridden []  Moribund  Past Medical History  Diagnosis Date  . Small bowel obstruction   . Atrial fibrillation     with rapid ventricular repsonse  . HTN (hypertension)   . Bone lesion     lytic  . Debility     Past Surgical History  Procedure Laterality Date  . Appendectomy    . Hernia repair      x2  . Parathyroidectomy      History  Smoking status  . Former Smoker  Smokeless tobacco  . Not on file    Comment: During WWII    History  Alcohol Use  . 4.2 oz/week  . 7 Glasses of wine per week    History   Social History  . Marital Status: Married    Spouse Name: N/A    Number of Children: N/A  . Years of Education: N/A   Occupational History  . retired    Social History Main Topics  . Smoking status: Former Research scientist (life sciences)  . Smokeless tobacco: Not on file     Comment: During WWII  . Alcohol Use: 4.2 oz/week    7 Glasses of wine per week  . Drug Use: No  . Sexual Activity: Not on file   Other Topics Concern  .  Not on file   Social History Narrative   Retired from the railroad.  One child    No Known Allergies  Current Facility-Administered Medications  Medication Dose Route Frequency Provider Last Rate Last Dose  . [facility] HOLD] 0.9 %  sodium chloride infusion  250 mL Intravenous PRN Dayna N Dunn, PA-C 10 mL/hr at 10/19/13 0516 10 mL at 10/19/13 0516  . 0.9 %  sodium chloride infusion  250 mL Intravenous PRN Tarri Fuller, PA-C      . 0.9 %  sodium chloride infusion   Intravenous Continuous Tarri Fuller, PA-C 20 mL/hr at 10/20/13 0800    . 0.9 %  sodium chloride infusion   Intravenous Continuous Troy Sine, MD 120 mL/hr at 10/20/13 1216    . [facility] HOLD] acetaminophen (TYLENOL) tablet 650 mg  650 mg Oral Q4H PRN Dayna N Dunn, PA-C      . [facility] HOLD] aspirin EC tablet 81 mg  81 mg Oral Daily Dayna N Dunn, PA-C   81 mg at 10/20/13 1010  . [[facility] HOLD] dextromethorphan-guaiFENesin (MUCINEX DM) 30-600 MG per 12 hr tablet 2 tablet  2 tablet Oral BID PRN  Minus Breeding, MD      . [facility] HOLD] diltiazem (CARDIZEM CD) 24 hr capsule 360 mg  360 mg Oral Daily Tarri Fuller, PA-C   360 mg at 10/20/13 1009  . [facility] HOLD] diltiazem (CARDIZEM) 100 mg in dextrose 5 % 100 mL infusion  5-15 mg/hr Intravenous Titrated Dayna N Dunn, PA-C   15 mg/hr at 10/19/13 0643  . heparin ADULT infusion 100 units/mL (25000 units/250 mL)  1,000 Units/hr Intravenous Continuous Rogue Bussing, RPH 10 mL/hr at 10/20/13 0822 1,000 Units/hr at 10/20/13 [id number]  . [[facility] HOLD] metoprolol tartrate (LOPRESSOR) tablet 12.5 mg  12.5 mg Oral BID Lelon Perla, MD      . [facility] HOLD] multivitamin with minerals tablet 1 tablet  1 tablet Oral Daily Minus Breeding, MD   1 tablet at 10/20/13 1010  . [[facility] HOLD] nitroGLYCERIN (NITROSTAT) SL tablet 0.4 mg  0.4 mg Sublingual Q5 Min x 3 PRN Dayna N Dunn, PA-C      . [[facility] HOLD] ondansetron (ZOFRAN) injection 4 mg  4 mg Intravenous Q6H PRN Dayna N Dunn, PA-C      . [facility] HOLD] sodium chloride 0.9 % injection 3 mL  3 mL Intravenous Q12H Dayna N Dunn, PA-C   3 mL at 10/20/13 1000  . [facility] HOLD] sodium chloride 0.9 % injection 3 mL  3 mL Intravenous PRN Dayna N Dunn, PA-C      . sodium chloride 0.9 % injection 3 mL  3 mL Intravenous Q12H Tarri Fuller, PA-C   3 mL at 10/20/13 1000  . sodium chloride 0.9 % injection 3 mL  3 mL Intravenous PRN Tarri Fuller, PA-C        Prescriptions prior to admission  Medication Sig Dispense Refill  . CARTIA XT 240 MG 24 hr capsule Take 240 mg by mouth daily.      [name] Dextromethorphan-Guaifenesin (MUCINEX DM MAXIMUM STRENGTH) 60-1200 MG TB12 Take 1 tablet by mouth 2 (two) times daily.      . Multiple Vitamin (MULTIVITAMIN) tablet Take 1 tablet by mouth daily.        [name] warfarin (COUMADIN) 5 MG tablet Take 5 mg by mouth every evening.         Family History: both mother and father died of "old age" in 25's  Review of Systems:  Cardiac Review of Systems: Y or N  Chest Pain [  n  ]  Resting SOB [ n  ] Exertional SOB  [  y ]  Orthopnea [ y ]   Pedal Edema [ n  ]    Palpitations Blue.Reese  ] Syncope  [  n]   Presyncope [  n ]  General Review of Systems: [Y] = yes [  ]=no Constitional: recent weight change [none  ]; anorexia [  ]; fatigue [  ]; nausea [  n]; night sweats [  n]; fever [  n]; or chills [ n ]                                                               Dental: poor dentition[ n ]; Last Dentist visit:   Eye : blurred vision [ n ]; diplopia [   ]; vision changes [ recent catatract ];  Amaurosis fugax[  ]; Resp: cough [  ];  wheezing[ n ];  hemoptysis[n  ]; shortness of breath[  y]; paroxysmal nocturnal dyspnea[ y ]; dyspnea on exertion[y  ]; or orthopnea[ y ];  GI:  gallstones[n  ], vomiting[ n ];  dysphagia[n  ]; melena[ n ];  hematochezia [n  ]; heartburn[n  ];   Hx of  Colonoscopy[y  ]; GU: kidney stones [  ]; hematuria[  ];   dysuria [  ];  nocturia[  ];  history of     obstruction [  ]; urinary frequency [  ]             Skin: rash, swelling[  ];, hair loss[  ];  peripheral edema[n ];  or itching[  ]; Musculosketetal: myalgias[  n];  joint swelling[ n ];  joint erythema[  ];  joint pain[  ];  back pain[ n ];  Heme/Lymph: bruising[  ];  bleeding[  ];  anemia[  ];  Neuro: TIA[n  ];  headaches[n  ];  stroke[  ];  vertigo[  ];  seizures[n  ];   paresthesias[  ];  difficulty walking[ n ];  Psych:depression[  ]; anxiety[  ];  Endocrine: diabetes[n  ];  thyroid dysfunction[n  ];  Immunizations: Flu [  ]; Pneumococcal[  ];  Other:  Physical Exam: BP 106/66  Pulse 73  Temp(Src) 97.4 F (36.3 C) (Oral)  Resp 28  Ht 5\' 8"  (1.727 m)  Wt 145 lb 8.1 oz (66 kg)  BMI 22.13 kg/m2  SpO2 97%  General appearance: alert, cooperative, appears stated age and no distress Neurologic: intact Heart: irregularly irregular rhythm, do not hear murmur of MR Lungs: clear to auscultation bilaterally Abdomen: soft, non-tender; bowel sounds normal; no masses,  no organomegaly Extremities: extremities normal, atraumatic, no  cyanosis or edema Wound: rt groin dressing in place no hematoma No carotid bruits   Diagnostic Studies & Laboratory data:     Recent Radiology Findings:  Dg Chest 2 View  10/17/2013   CLINICAL DATA:  sob. a fib.  EXAM: CHEST  2 VIEW  COMPARISON:  DG CHEST 2V dated 07/10/2013; DG CHEST 2V dated 07/09/2012; DG CHEST 1V PORT dated 07/31/2010; DG CHEST 2 VIEW dated 02/11/2008  FINDINGS: There is bilateral chronic interstitial disease. There are bilateral calcified pleural plaques. There is no  new focal consolidation, pleural effusion or pneumothorax. The heart and mediastinal contours are unremarkable. There is aortic atherosclerosis.  There is mild degenerative disc disease of the thoracic spine.  IMPRESSION: 1. No acute cardiopulmonary disease. 2. Bilateral pleural plaques and chronic interstitial disease as can be seen with prior asbestos exposure.   Electronically Signed   By: Kathreen Devoid   On: 10/17/2013 13:15    Recent Lab Findings: Lab Results  Component Value Date   WBC 7.8 10/20/2013   HGB 12.3* 10/20/2013   HCT 36.7* 10/20/2013   PLT 431* 10/20/2013   GLUCOSE 97 10/20/2013   ALT 11 10/18/2013   AST 18 10/18/2013   NA 138 10/20/2013   K 3.5* 10/20/2013   CL 100 10/20/2013   CREATININE 0.81 10/20/2013   BUN 13 10/20/2013   CO2 27 10/20/2013   TSH 1.279 10/17/2013   INR 1.41 10/20/2013   Lab Results  Component Value Date   CKTOTAL 74 08/15/2010   CKMB 1.7 08/15/2010   TROPONINI <0.30 10/18/2013      ECHO: [facility]* [address] [place], [place] [address] [phone]  ------------------------------------------------------------ Transthoracic Echocardiography  Patient: Keondrick, Dandy MR #: [id number] Study Date: 10/18/2013 Gender: M Age: 62 Height: 172.7cm Weight: 65.3kg BSA: 1.38m^2 Pt. Status: Room: [place], Britt Bolognese, Dayna SONOGRAPHER Mauricio Po, RDCS,  CCT cc:  ------------------------------------------------------------ LV EF: 35% - 40%  ------------------------------------------------------------ History: PMH: Elevated troponin Atrial fibrillation. Risk factors: NSTEMI. Hypertension.  ------------------------------------------------------------ Study Conclusions  - Left ventricle: The cavity size was normal. Systolic function was moderately reduced. The estimated ejection fraction was in the range of 35% to 40%. Moderate diffuse hypokinesis. Possible severe hypokinesis of the mid-distalanteroseptal and apical myocardium. - Aortic valve: Trivial regurgitation. - Mitral valve: Moderate regurgitation. - Left atrium: The atrium was moderately to severely dilated. - Pulmonary arteries: Systolic pressure was mildly to moderately increased. PA peak pressure: 65mm Hg (S). Transthoracic echocardiography. M-mode, complete 2D, spectral Doppler, and color Doppler. Height: Height: 172.7cm. Height: 68in. Weight: Weight: 65.3kg. Weight: 143.7lb. Body mass index: BMI: 21.9kg/m^2. Body surface area: BSA: 1.14m^2. Blood pressure: 120/58. Patient status: Inpatient. Location: ICU/CCU  ------------------------------------------------------------  ------------------------------------------------------------ Left ventricle: The cavity size was normal. Systolic function was moderately reduced. The estimated ejection fraction was in the range of 35% to 40%. Moderate diffuse hypokinesis. Regional wall motion abnormalities: Possible severe hypokinesis of the mid-distalanteroseptal and apical myocardium.  ------------------------------------------------------------ Aortic valve: Trileaflet; normal thickness leaflets. Mobility was not restricted. Doppler: Transvalvular velocity was within the normal range. There was no stenosis. Trivial regurgitation.  ------------------------------------------------------------ Aorta: Aortic root: The aortic  root was normal in size.  ------------------------------------------------------------ Mitral valve: Structurally normal valve. Mobility was not restricted. Doppler: Transvalvular velocity was within the normal range. There was no evidence for stenosis. Moderate regurgitation.  ------------------------------------------------------------ Left atrium: The atrium was moderately to severely dilated.  ------------------------------------------------------------ Right ventricle: The cavity size was normal. Wall thickness was normal. Systolic function was normal.  ------------------------------------------------------------ Pulmonic valve: Not well visualized. The valve appears to be grossly normal. Doppler: Transvalvular velocity was within the normal range. There was no evidence for stenosis.  ------------------------------------------------------------ Tricuspid valve: Structurally normal valve. Doppler: Transvalvular velocity was within the normal range. Mild regurgitation.  ------------------------------------------------------------ Pulmonary artery: The main pulmonary artery was normal-sized. Systolic pressure was mildly to moderately increased.  ------------------------------------------------------------ Right atrium: The atrium was normal in size.  ------------------------------------------------------------ Pericardium: There was no pericardial effusion.  ------------------------------------------------------------ Systemic  veins: Inferior vena cava: The vessel was normal in size.  ------------------------------------------------------------  2D measurements Normal Doppler measurements Normal Left ventricle Main pulmonary LVID ED, 50.6 mm 43-52 artery chord, Pressure, S 39 mm =30 PLAX Hg LVID ES, 38.4 mm 23-38 Tricuspid valve chord, Regurg peak 291 cm/s ------ PLAX vel FS, chord, 24 % >29 Peak RV-RA 34 mm ------ PLAX gradient, S Hg LVPW, ED 11.6 mm ------  Systemic veins IVS/LVPW 0.72 <1.3 Estimated 5 mm ------ ratio, ED CVP Hg Ventricular septum Right ventricle IVS, ED 8.3 mm ------ Pressure, S 39 mm <30 Aorta Hg Root diam, 29 mm ------ ED Left atrium AP dim 47 mm ------ AP dim 2.64 cm/m^2 <2.2 index Vol, S 91 ml ------ Vol index, 51.1 ml/m^2 ------ S  ------------------------------------------------------------ Prepared and Electronically Authenticated by  Landry Corporal 2015-01-04T17:56:01.120  Cardiac Cath: PROCEDURE:  The patient was brought to the second floor [place] [facility]iac cath lab in the postabsorptive state. He was premedicated with Versed 1 mg and fentanyl 12.5 mcg. His right groin was prepped and shaved in usual sterile fashion. Xylocaine 1% was used for local anesthesia. A 5 French sheath was inserted into the right femoral artery. Diagnostic catheterizatiion was done utilizing 5 [place] LF4, FR4, and pigtail catheters. Left ventriculography was done with 25 ccOmnipaque contrast at 12 cc/sec. Hemostasis was obtained by direct manual compression. The patient tolerated the procedure well and left the catheterization laboratory with stable hemodynamics and chest pain-free.  HEMODYNAMICS:  Central Aorta: 119/60  Left Ventricle: 119/11  ANGIOGRAPHY:  Fluoroscopy revealed significant calcification of the coronary arteries as well as calcification of the aorta. In addition, there is extensive pleural calcification suggestive of asbestosis exposure.  1. Left main: Calcified with 80-90% distal left main stenosis.  2. LAD: Calcified with 80-90% ostial stenosis followed by 70% stenosis in the region of the first diagonal takeoff with 80% stenosis at the ostium of this first diagonal vessel. The LAD gave rise to several septal perforating arteries and extended to the apex.  3. Left circumflex: Calcified with 50% proximal stenosis, 80-90% stenosis in the obtuse marginal branch diffusely, and 60 and 70% stenosis in the AV groove  circumflex before giving rise to a small marginal branch. There is significant left to right collateralization supplying the distal right coronary artery.  4. Right coronary artery: Patient is in the proximal segment with subtotal 99 100% stenosis in the mid RCA with previously noted significant left to right collaterals predominately supplied the distal RCA territory.  5. Un-bypassed LIMA is widely patent with a normal proximal left subclavian artery, suitable for CABG revascularization surgery.  6. Left ventriculography revealed moderate LV dysfunction with an ejection fraction of approximately 35% with moderately severe mid distal anterolateral hypocontractility and distal inferior hypokinesis.  IMPRESSION:  Moderate global LV dysfunction with moderately severe mid distal anterolateral and distal inferior hypocontractility with an ejection fraction of 35%.  Significant coronary calcification with severe multivessel CAD with 80-90% distal left main and ostial LAD stenosis, 70% proximal LAD stenosis with 80% stenosis in the first diagonal branch of the LAD; 50% proximal circumflex 60 and 70% mid AV groove circumflex and 89% OM1 circumflex stenoses; and subtotal/total occlusion of the right coronary artery with extensive left-to-right collaterals.  Patent left subclavian and left internal mammary artery vessel suitable for CABG revascularization surgery.  RECOMMENDATION:  The patient's anatomy was reviewed with both he and his wife in detail. The patient does have a high-grade severe coronary artery disease with surgical anatomy.  Surgical consultation will be obtained for consideration of CABG revascularization surgery in this 80 year old gentleman who is remaining fairly active and appears significantly younger than his stated age.  Troy Sine, MD, [facility]  10/20/2013  11:46 AM    Assessment / Plan:   1. Recent nonstemi mi less then 7 days (1/3) with 3 vessel CAD and high grade left main and  depressed lv systolic function EF [id number]  2. Chronic A fib, moderate MR 3.Hx of Asbestosis Patient at increased risk for surgical intervention but I have recommended surgical treatment of his left main disease. He is active, cares  for himself and exercises daily. The goals risks and alternatives of the planned surgical procedure CABG, Atrial clip, poss Mitral repair  have been discussed with the patient in detail. The risks of the procedure including death, infection, stroke, myocardial infarction, bleeding, blood transfusion have all been discussed specifically.  I have quoted Nadean Corwin Rowles a 10% of perioperative mortality and a complication rate as high as 40 %. The patient's questions have been answered.Nadean Corwin Osmer is willing  to proceed with the planned procedure. Plan for am.   Grace Isaac MD      [address] [place] [address] Office [phone]   Beeper [id number]  10/20/2013 1:06 PM

## 2013-10-20 NOTE — CV Procedure (Signed)
[name] is a 80 y.o. male    [id number]  [id number] LOCATION:  FACILITY: [facility]  PHYSICIAN: Troy Sine, MD, [facility] Mar 04, 1918   DATE OF PROCEDURE:  10/20/2013     CARDIAC CATHETERIZATION     HISTORY: [name] is a 80 year old white male who has remained active and appears much younger than his stated age. He has a history of paroxysmal atrial fibrillation and had been on Coumadin therapy.  He was admitted with atrial fibrillation. An echocardiogram suggested an ejection fraction of approximately 35%. Coumadin has been held, and now presents for definitive diagnostic cardiac catheterization to assess for coronary obstructive disease.    PROCEDURE:  The patient was brought to the second floor [place] Cardiac cath lab in the postabsorptive state. He was premedicated with Versed 1 mg and fentanyl 12.5 mcg. His right groin was prepped and shaved in usual sterile fashion. Xylocaine 1% was used for local anesthesia. A 5 French sheath was inserted into the right femoral artery. Diagnostic catheterizatiion was done utilizing 5 [place] LF4, FR4, and pigtail catheters. Left ventriculography was done with 25 ccOmnipaque contrast at 12 cc/sec. Hemostasis was obtained by direct manual compression. The patient tolerated the procedure well and left the catheterization laboratory with stable hemodynamics and chest pain-free.   HEMODYNAMICS:   Central Aorta: 119/60    Left Ventricle: 119/11  ANGIOGRAPHY:  Fluoroscopy revealed significant calcification of the coronary arteries as well as  calcification of the aorta. In addition, there is extensive pleural calcification suggestive of asbestosis exposure.  1. Left main: Calcified with 80-90% distal left main stenosis. 2. LAD: Calcified with 80-90% ostial stenosis followed by 70% stenosis in the region of the first diagonal takeoff with 80% stenosis at the ostium of this first diagonal vessel. The LAD gave rise to several septal  perforating arteries and extended to the apex. 3. Left circumflex: Calcified with 50% proximal stenosis, 80-90% stenosis in the obtuse marginal branch diffusely, and 60 and 70% stenosis in the AV groove circumflex before giving rise to a small marginal branch. There is significant left to right collateralization supplying the distal right coronary artery.  4. Right coronary artery: Patient is in the proximal segment with subtotal 99 100% stenosis in the mid RCA with previously noted significant left to right collaterals predominately supplied the distal RCA territory.  5. Un-bypassed LIMA is widely patent with a normal proximal left subclavian artery, suitable for CABG revascularization surgery.    6.  Left ventriculography revealed moderate LV dysfunction with an ejection fraction of approximately 35% with moderately severe mid distal anterolateral hypocontractility and distal inferior hypokinesis.  IMPRESSION:  Moderate global LV dysfunction with moderately severe mid distal anterolateral and distal inferior hypocontractility with an ejection fraction of 35%.  Significant coronary calcification with severe multivessel CAD with 80-90% distal left main and ostial LAD stenosis, 70% proximal LAD stenosis with 80% stenosis in the first diagonal branch of the LAD; 50% proximal circumflex 60 and 70% mid AV groove circumflex and 89% OM1 circumflex stenoses; and subtotal/total occlusion of the right coronary artery with extensive left-to-right collaterals.  Patent left subclavian and left internal mammary artery vessel suitable for CABG revascularization surgery.  RECOMMENDATION:   The patient's anatomy was reviewed with both he and his wife in detail. The patient does have a high-grade severe coronary artery disease with surgical anatomy. Surgical consultation will be obtained for consideration of CABG revascularization surgery in this 80 year old gentleman who is remaining fairly active and appears  significantly younger than his stated age.   Troy Sine, MD, [facility] 10/20/2013 11:46 AM

## 2013-10-20 NOTE — Progress Notes (Signed)
Pre-op Cardiac Surgery  Carotid Findings:  Findings suggest 1-39% internal carotid artery stenosis bilaterally. Vertebral arteries are patent with antegrade flow.  Upper Extremity Right Left  Brachial Pressures 91-Triphasic 103-Triphasic  Radial Waveforms Biphasic Biphasic  Ulnar Waveforms Biphasic Biphasic  Palmar Arch (Allen's Test) Within normal limits Within normal limits    Findings:   Bilateral palpable pedal pulses.  10/20/2013 4:15 PM Maudry Mayhew, RVT, RDCS, RDMS  Landry Mellow, RVT, RDMS

## 2013-10-20 NOTE — H&P (View-Only) (Signed)
Subjective: No further dyspnea.  No chest pain.  Objective: Vital signs in last 24 hours: Temp:  [97.4 F (36.3 C)-98 F (36.7 C)] 97.4 F (36.3 C) (01/06 0800) Pulse Rate:  [51-117] 73 (01/06 0800) Resp:  [12-28] 28 (01/06 0800) BP: (106-122)/(43-67) 106/66 mmHg (01/06 0800) SpO2:  [93 %-98 %] 97 % (01/06 0800) Weight:  [145 lb 8.1 oz (66 kg)] 145 lb 8.1 oz (66 kg) (01/06 0400) Last BM Date: 10/20/13  Intake/Output from previous day: 01/05 0701 - 01/06 0700 In: 1031 [P.O.:720; I.V.:261; IV Piggyback:50] Out: 725 [Urine:725] Intake/Output this shift: Total I/O In: 56.3 [I.V.:56.3] Out: -   Medications Current Facility-Administered Medications  Medication Dose Route Frequency Provider Last Rate Last Dose  . 0.9 %  sodium chloride infusion  250 mL Intravenous PRN Dayna N Dunn, PA-C 10 mL/hr at 10/19/13 0516 10 mL at 10/19/13 0516  . 0.9 %  sodium chloride infusion  250 mL Intravenous PRN Tarri Fuller, PA-C      . 0.9 %  sodium chloride infusion   Intravenous Continuous Tarri Fuller, PA-C 20 mL/hr at 10/20/13 0800    . acetaminophen (TYLENOL) tablet 650 mg  650 mg Oral Q4H PRN Dayna N Dunn, PA-C      . aspirin EC tablet 81 mg  81 mg Oral Daily Dayna N Dunn, PA-C   81 mg at 10/19/13 1010  . dextromethorphan-guaiFENesin (MUCINEX DM) 30-600 MG per 12 hr tablet 2 tablet  2 tablet Oral BID PRN Minus Breeding, MD      . diltiazem (CARDIZEM CD) 24 hr capsule 360 mg  360 mg Oral Daily Tarri Fuller, PA-C   360 mg at 10/19/13 1102  . diltiazem (CARDIZEM) 100 mg in dextrose 5 % 100 mL infusion  5-15 mg/hr Intravenous Titrated Dayna N Dunn, PA-C   15 mg/hr at 10/19/13 0643  . heparin ADULT infusion 100 units/mL (25000 units/250 mL)  1,000 Units/hr Intravenous Continuous Rogue Bussing, RPH 10 mL/hr at 10/20/13 0822 1,000 Units/hr at 10/20/13 7989  . multivitamin with minerals tablet 1 tablet  1 tablet Oral Daily Minus Breeding, MD   1 tablet at 10/19/13 1010  . nitroGLYCERIN  (NITROSTAT) SL tablet 0.4 mg  0.4 mg Sublingual Q5 Min x 3 PRN Dayna N Dunn, PA-C      . ondansetron (ZOFRAN) injection 4 mg  4 mg Intravenous Q6H PRN Dayna N Dunn, PA-C      . sodium chloride 0.9 % injection 3 mL  3 mL Intravenous Q12H Dayna N Dunn, PA-C   3 mL at 10/18/13 2130  . sodium chloride 0.9 % injection 3 mL  3 mL Intravenous PRN Dayna N Dunn, PA-C      . sodium chloride 0.9 % injection 3 mL  3 mL Intravenous Q12H Bryan Hager, PA-C      . sodium chloride 0.9 % injection 3 mL  3 mL Intravenous PRN Tarri Fuller, PA-C        PE: WD/WN, NAD HEENT: normal Neck: supple Chest: CTA CV: irregular and tachycardic Abd: soft, NT Ext: no edema.   Lab Results:   Recent Labs  10/18/13 0830 10/19/13 0615 10/20/13 0435  WBC 7.9 8.6 7.8  HGB 12.6* 13.1 12.3*  HCT 36.9* 38.2* 36.7*  PLT 411* 441* 431*   BMET  Recent Labs  10/17/13 1416 10/18/13 0830 10/20/13 0435  NA 138 136* 138  K 4.1 3.6* 3.5*  CL 101 99 100  CO2 25 25 27   GLUCOSE 102*  97 97  BUN 12 9 13  CREATININE 0.83 0.79 0.81  CALCIUM 8.7 8.4 8.4   PT/INR  Recent Labs  10/19/13 0615 10/19/13 2047 10/20/13 0435  LABPROT 26.7* 19.8* 16.9*  INR 2.57* 1.74* 1.41    Cardiac Panel (last 3 results)  Recent Labs  10/17/13 2000 10/18/13 0100 10/18/13 0830  TROPONINI <0.30 0.33* <0.30     Studies/Results: 2D echo,  Study Conclusions  - Left ventricle: The cavity size was normal. Systolic function was moderately reduced. The estimated ejection fraction was in the range of 35% to 40%. Moderate diffuse hypokinesis. Possible severe hypokinesis of the mid-distalanteroseptal and apical myocardium. - Aortic valve: Trivial regurgitation. - Mitral valve: Moderate regurgitation. - Left atrium: The atrium was moderately to severely dilated. - Pulmonary arteries: Systolic pressure was mildly to moderately increased. PA peak pressure: 39mm Hg (S).   Assessment/Plan 1 atrial fibrillation-Patient remains in  atrial fibrillation. His heart rate is mildly increased. Will continue Cardizem. Add low-dose metoprolol. Given reduced LV function I would like to decrease Cardizem and increase beta blocker which can be done as an outpatient. Once cardiac catheterization and all procedures complete will resume anticoagulation. Cardiovert 3 weeks after once fully anticoagulated. If rate difficult to control and in order he is symptomatic despite rate control Will need TEE guided cardioversion. 2 cardiomyopathy - His LV function is reduced. As outlined by Dr. Hochrein, plan cardiac catheterization. The risks and benefits were discussed and the patient agrees to proceed. Cardiomyopathy may be related to atrial fibrillation and tachycardia. Would plan to repeat echocardiogram once sinus reestablished. If LV function remains decreased could add an ACE inhibitor at that time if blood pressure allows. 3 acute systolic congestive heart failure-symptoms are much improved. Most likely related to atrial fibrillation with rapid ventricular response. Brian Crenshaw 9:10 AM  

## 2013-10-20 NOTE — Interval H&P Note (Signed)
Cath Lab Visit (complete for each Cath Lab visit)  Clinical Evaluation Leading to the Procedure:   ACS: no  Non-ACS:    Anginal Classification: CCS I  Anti-ischemic medical therapy: Minimal Therapy (1 class of medications)  Non-Invasive Test Results: No non-invasive testing performed  Prior CABG: No previous CABG      History and Physical Interval Note:  10/20/2013 10:47 AM  [name]  has presented today for surgery, with the diagnosis of Chest pain  The various methods of treatment have been discussed with the patient and family. After consideration of risks, benefits and other options for treatment, the patient has consented to  Procedure(s): LEFT HEART CATHETERIZATION WITH CORONARY ANGIOGRAM (N/A) as a surgical intervention .  The patient's history has been reviewed, patient examined, no change in status, stable for surgery.  I have reviewed the patient's chart and labs.  Questions were answered to the patient's satisfaction.     Alexx Mcburney A

## 2013-10-20 NOTE — Progress Notes (Signed)
Subjective: No further dyspnea.  No chest pain.  Objective: Vital signs in last 24 hours: Temp:  [97.4 F (36.3 C)-98 F (36.7 C)] 97.4 F (36.3 C) (01/06 0800) Pulse Rate:  [51-117] 73 (01/06 0800) Resp:  [12-28] 28 (01/06 0800) BP: (106-122)/(43-67) 106/66 mmHg (01/06 0800) SpO2:  [93 %-98 %] 97 % (01/06 0800) Weight:  [145 lb 8.1 oz (66 kg)] 145 lb 8.1 oz (66 kg) (01/06 0400) Last BM Date: 10/20/13  Intake/Output from previous day: 01/05 0701 - 01/06 0700 In: 1031 [P.O.:720; I.V.:261; IV Piggyback:50] Out: 725 [Urine:725] Intake/Output this shift: Total I/O In: 56.3 [I.V.:56.3] Out: -   Medications Current Facility-Administered Medications  Medication Dose Route Frequency Provider Last Rate Last Dose  . 0.9 %  sodium chloride infusion  250 mL Intravenous PRN Dayna N Dunn, PA-C 10 mL/hr at 10/19/13 0516 10 mL at 10/19/13 0516  . 0.9 %  sodium chloride infusion  250 mL Intravenous PRN Tarri Fuller, PA-C      . 0.9 %  sodium chloride infusion   Intravenous Continuous Tarri Fuller, PA-C 20 mL/hr at 10/20/13 0800    . acetaminophen (TYLENOL) tablet 650 mg  650 mg Oral Q4H PRN Dayna N Dunn, PA-C      . aspirin EC tablet 81 mg  81 mg Oral Daily Dayna N Dunn, PA-C   81 mg at 10/19/13 1010  . dextromethorphan-guaiFENesin (MUCINEX DM) 30-600 MG per 12 hr tablet 2 tablet  2 tablet Oral BID PRN Minus Breeding, MD      . diltiazem (CARDIZEM CD) 24 hr capsule 360 mg  360 mg Oral Daily Tarri Fuller, PA-C   360 mg at 10/19/13 1102  . diltiazem (CARDIZEM) 100 mg in dextrose 5 % 100 mL infusion  5-15 mg/hr Intravenous Titrated Dayna N Dunn, PA-C   15 mg/hr at 10/19/13 0643  . heparin ADULT infusion 100 units/mL (25000 units/250 mL)  1,000 Units/hr Intravenous Continuous Rogue Bussing, RPH 10 mL/hr at 10/20/13 0822 1,000 Units/hr at 10/20/13 7989  . multivitamin with minerals tablet 1 tablet  1 tablet Oral Daily Minus Breeding, MD   1 tablet at 10/19/13 1010  . nitroGLYCERIN  (NITROSTAT) SL tablet 0.4 mg  0.4 mg Sublingual Q5 Min x 3 PRN Dayna N Dunn, PA-C      . ondansetron (ZOFRAN) injection 4 mg  4 mg Intravenous Q6H PRN Dayna N Dunn, PA-C      . sodium chloride 0.9 % injection 3 mL  3 mL Intravenous Q12H Dayna N Dunn, PA-C   3 mL at 10/18/13 2130  . sodium chloride 0.9 % injection 3 mL  3 mL Intravenous PRN Dayna N Dunn, PA-C      . sodium chloride 0.9 % injection 3 mL  3 mL Intravenous Q12H Bryan Hager, PA-C      . sodium chloride 0.9 % injection 3 mL  3 mL Intravenous PRN Tarri Fuller, PA-C        PE: WD/WN, NAD HEENT: normal Neck: supple Chest: CTA CV: irregular and tachycardic Abd: soft, NT Ext: no edema.   Lab Results:   Recent Labs  10/18/13 0830 10/19/13 0615 10/20/13 0435  WBC 7.9 8.6 7.8  HGB 12.6* 13.1 12.3*  HCT 36.9* 38.2* 36.7*  PLT 411* 441* 431*   BMET  Recent Labs  10/17/13 1416 10/18/13 0830 10/20/13 0435  NA 138 136* 138  K 4.1 3.6* 3.5*  CL 101 99 100  CO2 25 25 27   GLUCOSE 102*  97 97  BUN 12 9 13   CREATININE 0.83 0.79 0.81  CALCIUM 8.7 8.4 8.4   PT/INR  Recent Labs  10/19/13 0615 10/19/13 2047 10/20/13 0435  LABPROT 26.7* 19.8* 16.9*  INR 2.57* 1.74* 1.41    Cardiac Panel (last 3 results)  Recent Labs  10/17/13 2000 10/18/13 0100 10/18/13 0830  TROPONINI <0.30 0.33* <0.30     Studies/Results: 2D echo,  Study Conclusions  - Left ventricle: The cavity size was normal. Systolic function was moderately reduced. The estimated ejection fraction was in the range of 35% to 40%. Moderate diffuse hypokinesis. Possible severe hypokinesis of the mid-distalanteroseptal and apical myocardium. - Aortic valve: Trivial regurgitation. - Mitral valve: Moderate regurgitation. - Left atrium: The atrium was moderately to severely dilated. - Pulmonary arteries: Systolic pressure was mildly to moderately increased. PA peak pressure: 41mm Hg (S).   Assessment/Plan 1 atrial fibrillation-Patient remains in  atrial fibrillation. His heart rate is mildly increased. Will continue Cardizem. Add low-dose metoprolol. Given reduced LV function I would like to decrease Cardizem and increase beta blocker which can be done as an outpatient. Once cardiac catheterization and all procedures complete will resume anticoagulation. Cardiovert 3 weeks after once fully anticoagulated. If rate difficult to control and in order he is symptomatic despite rate control Will need TEE guided cardioversion. 2 cardiomyopathy - His LV function is reduced. As outlined by Dr. Percival Spanish, plan cardiac catheterization. The risks and benefits were discussed and the patient agrees to proceed. Cardiomyopathy may be related to atrial fibrillation and tachycardia. Would plan to repeat echocardiogram once sinus reestablished. If LV function remains decreased could add an ACE inhibitor at that time if blood pressure allows. 3 acute systolic congestive heart failure-symptoms are much improved. Most likely related to atrial fibrillation with rapid ventricular response. Kirk Ruths 9:10 AM

## 2013-10-20 NOTE — Progress Notes (Signed)
ANTICOAGULATION CONSULT NOTE - Follow Up Consult  Pharmacy Consult for heparin Indication: chest pain/ACS and atrial fibrillation  Labs:  Recent Labs  10/17/13 1416 10/17/13 2000 10/18/13 0100 10/18/13 0830 10/19/13 0615 10/19/13 2047 10/20/13 0435  HGB 12.5*  --   --  12.6* 13.1  --  12.3*  HCT 36.8*  --   --  36.9* 38.2*  --  36.7*  PLT 408*  --   --  411* 441*  --  431*  LABPROT 38.3*  --   --  32.5* 26.7* 19.8* 16.9*  INR 4.12*  --   --  3.32* 2.57* 1.74* 1.41  HEPARINUNFRC  --   --   --   --   --   --  <0.10*  CREATININE 0.83  --   --  0.79  --   --  0.81  TROPONINI  --  <0.30 0.33* <0.30  --   --   --     Assessment: 80yo male undetectable on heparin with initial dosing for CP and low INR for Afib.  Goal of Therapy:  Heparin level 0.3-0.7 units/ml   Plan:  Will increase heparin gtt by 3-4 units/kg/hr to 1000 units/hr and f/u after cath.  Wynona Neat, PharmD, BCPS  10/20/2013,7:08 AM

## 2013-10-20 NOTE — Progress Notes (Signed)
ANTICOAGULATION CONSULT NOTE - Follow Up Consult  Pharmacy Consult for Heparin Indication: 3v CAD pending CABG  No Known Allergies  Patient Measurements: Height: 5\' 8"  (172.7 cm) Weight: 145 lb 8.1 oz (66 kg) IBW/kg (Calculated) : 68.4 Heparin Dosing Weight: 66kg  Vital Signs: Temp: 97.4 F (36.3 C) (01/06 0800) Temp src: Oral (01/06 0800) BP: 106/66 mmHg (01/06 1009) Pulse Rate: 114 (01/06 1032)  Labs:  Recent Labs  10/17/13 2000 10/18/13 0100  10/18/13 0830 10/19/13 0615 10/19/13 2047 10/20/13 0435  HGB  --   --   < > 12.6* 13.1  --  12.3*  HCT  --   --   --  36.9* 38.2*  --  36.7*  PLT  --   --   --  411* 441*  --  431*  LABPROT  --   --   < > 32.5* 26.7* 19.8* 16.9*  INR  --   --   < > 3.32* 2.57* 1.74* 1.41  HEPARINUNFRC  --   --   --   --   --   --  <0.10*  CREATININE  --   --   --  0.79  --   --  0.81  TROPONINI <0.30 0.33*  --  <0.30  --   --   --   < > = values in this interval not displayed.  Estimated Creatinine Clearance: 53.2 ml/min (by C-G formula based on Cr of 0.81).  Assessment: 89yom s/p cath today found to have 3v CAD with high grade left main stenosis and depressed EF. He will begin heparin awaiting CABG in the morning. He was on heparin prior to his cath - rate was increased to 1000 units/hr but follow up level was not drawn. Will begin at this rate.  Heparin to begin 6 hours after sheath removal. Sheath removed at 1210.  Goal of Therapy:  Heparin level 0.3-0.7 units/ml Monitor platelets by anticoagulation protocol: Yes   Plan:  1) At 1800 today, begin heparin at 1000 units/hr with NO bolus 2) 8 hour heparin level  Deboraha Sprang 10/20/2013,3:47 PM

## 2013-10-21 ENCOUNTER — Inpatient Hospital Stay (HOSPITAL_COMMUNITY): Payer: Medicare Other | Admitting: Anesthesiology

## 2013-10-21 ENCOUNTER — Encounter (HOSPITAL_COMMUNITY): Payer: Medicare Other | Admitting: Anesthesiology

## 2013-10-21 ENCOUNTER — Encounter (HOSPITAL_COMMUNITY)
Admission: EM | Disposition: A | Discharge status: Skilled Nursing Facility | Payer: Medicare Other | Source: Home / Self Care | Attending: Cardiothoracic Surgery

## 2013-10-21 ENCOUNTER — Encounter (HOSPITAL_COMMUNITY): Payer: Self-pay | Admitting: Anesthesiology

## 2013-10-21 ENCOUNTER — Inpatient Hospital Stay (HOSPITAL_COMMUNITY): Payer: Medicare Other

## 2013-10-21 DIAGNOSIS — Z951 Presence of aortocoronary bypass graft: Secondary | ICD-10-CM

## 2013-10-21 DIAGNOSIS — I2510 Atherosclerotic heart disease of native coronary artery without angina pectoris: Secondary | ICD-10-CM

## 2013-10-21 HISTORY — DX: Presence of aortocoronary bypass graft: Z95.1

## 2013-10-21 HISTORY — PX: CORONARY ARTERY BYPASS GRAFT: SHX141

## 2013-10-21 HISTORY — PX: CLIPPING OF ATRIAL APPENDAGE: SHX5773

## 2013-10-21 HISTORY — PX: INTRAOPERATIVE TRANSESOPHAGEAL ECHOCARDIOGRAM: SHX5062

## 2013-10-21 LAB — GLUCOSE, CAPILLARY
GLUCOSE-CAPILLARY: 116 mg/dL — AB (ref 70–99)
Glucose-Capillary: 131 mg/dL — ABNORMAL HIGH (ref 70–99)
Glucose-Capillary: 101 mg/dL — ABNORMAL HIGH (ref 70–99)
Glucose-Capillary: 99 mg/dL (ref 70–99)

## 2013-10-21 LAB — POCT I-STAT 3, ART BLOOD GAS (G3+)
ACID-BASE DEFICIT: 2 mmol/L (ref 0.0–2.0)
ACID-BASE EXCESS: 1 mmol/L (ref 0.0–2.0)
ACID-BASE EXCESS: 1 mmol/L (ref 0.0–2.0)
Acid-Base Excess: 1 mmol/L (ref 0.0–2.0)
Acid-base deficit: 3 mmol/L — ABNORMAL HIGH (ref 0.0–2.0)
Acid-base deficit: 3 mmol/L — ABNORMAL HIGH (ref 0.0–2.0)
BICARBONATE: 25.9 meq/L — AB (ref 20.0–24.0)
BICARBONATE: 24.2 meq/L — AB (ref 20.0–24.0)
BICARBONATE: 22.4 meq/L (ref 20.0–24.0)
BICARBONATE: 22.1 meq/L (ref 20.0–24.0)
Bicarbonate: 26.3 mEq/L — ABNORMAL HIGH (ref 20.0–24.0)
Bicarbonate: 23.4 mEq/L (ref 20.0–24.0)
Bicarbonate: 24.1 mEq/L — ABNORMAL HIGH (ref 20.0–24.0)
Bicarbonate: 25.3 mEq/L — ABNORMAL HIGH (ref 20.0–24.0)
O2 SAT: 100 %
O2 SAT: 86 %
O2 SAT: 97 %
O2 Saturation: 100 %
O2 Saturation: 99 %
O2 Saturation: 90 %
O2 Saturation: 99 %
O2 Saturation: 96 %
PCO2 ART: 41.8 mmHg (ref 35.0–45.0)
PCO2 ART: 35.8 mmHg (ref 35.0–45.0)
PCO2 ART: 37 mmHg (ref 35.0–45.0)
PCO2 ART: 39.7 mmHg (ref 35.0–45.0)
PH ART: 7.418 (ref 7.350–7.450)
PH ART: 7.398 (ref 7.350–7.450)
PO2 ART: 415 mmHg — AB (ref 80.0–100.0)
PO2 ART: 134 mmHg — AB (ref 80.0–100.0)
PO2 ART: 48 mmHg — AB (ref 80.0–100.0)
PO2 ART: 55 mmHg — AB (ref 80.0–100.0)
PO2 ART: 118 mmHg — AB (ref 80.0–100.0)
PO2 ART: 91 mmHg (ref 80.0–100.0)
PO2 ART: 84 mmHg (ref 80.0–100.0)
Patient temperature: 34.8
Patient temperature: 35.5
Patient temperature: 36.9
Patient temperature: 37.6
Patient temperature: 37
TCO2: 28 mmol/L (ref 0–100)
TCO2: 27 mmol/L (ref 0–100)
TCO2: 25 mmol/L (ref 0–100)
TCO2: 25 mmol/L (ref 0–100)
TCO2: 24 mmol/L (ref 0–100)
TCO2: 23 mmol/L (ref 0–100)
TCO2: 25 mmol/L (ref 0–100)
TCO2: 26 mmol/L (ref 0–100)
pCO2 arterial: 41.7 mmHg (ref 35.0–45.0)
pCO2 arterial: 40.1 mmHg (ref 35.0–45.0)
pCO2 arterial: 37.5 mmHg (ref 35.0–45.0)
pCO2 arterial: 32.6 mmHg — ABNORMAL LOW (ref 35.0–45.0)
pH, Arterial: 7.408 (ref 7.350–7.450)
pH, Arterial: 7.418 (ref 7.350–7.450)
pH, Arterial: 7.346 — ABNORMAL LOW (ref 7.350–7.450)
pH, Arterial: 7.439 (ref 7.350–7.450)
pH, Arterial: 7.424 (ref 7.350–7.450)
pH, Arterial: 7.411 (ref 7.350–7.450)
pO2, Arterial: 407 mmHg — ABNORMAL HIGH (ref 80.0–100.0)

## 2013-10-21 LAB — CBC
HCT: 26.4 % — ABNORMAL LOW (ref 39.0–52.0)
HCT: 24.2 % — ABNORMAL LOW (ref 39.0–52.0)
HEMATOCRIT: 36.8 % — AB (ref 39.0–52.0)
HEMOGLOBIN: 12.2 g/dL — AB (ref 13.0–17.0)
Hemoglobin: 9 g/dL — ABNORMAL LOW (ref 13.0–17.0)
Hemoglobin: 8.4 g/dL — ABNORMAL LOW (ref 13.0–17.0)
MCH: 30.3 pg (ref 26.0–34.0)
MCH: 30.7 pg (ref 26.0–34.0)
MCH: 30.8 pg (ref 26.0–34.0)
MCHC: 33.2 g/dL (ref 30.0–36.0)
MCHC: 34.1 g/dL (ref 30.0–36.0)
MCHC: 34.7 g/dL (ref 30.0–36.0)
MCV: 91.5 fL (ref 78.0–100.0)
MCV: 90.1 fL (ref 78.0–100.0)
MCV: 88.6 fL (ref 78.0–100.0)
PLATELETS: 215 10*3/uL (ref 150–400)
Platelets: 415 10*3/uL — ABNORMAL HIGH (ref 150–400)
Platelets: 232 10*3/uL (ref 150–400)
RBC: 4.02 MIL/uL — AB (ref 4.22–5.81)
RBC: 2.93 MIL/uL — ABNORMAL LOW (ref 4.22–5.81)
RBC: 2.73 MIL/uL — ABNORMAL LOW (ref 4.22–5.81)
RDW: 14.9 % (ref 11.5–15.5)
RDW: 14.8 % (ref 11.5–15.5)
RDW: 14.8 % (ref 11.5–15.5)
WBC: 6.8 10*3/uL (ref 4.0–10.5)
WBC: 16.8 10*3/uL — AB (ref 4.0–10.5)
WBC: 13.2 10*3/uL — ABNORMAL HIGH (ref 4.0–10.5)

## 2013-10-21 LAB — CREATININE, SERUM
Creatinine, Ser: 0.67 mg/dL (ref 0.50–1.35)
GFR calc Af Amer: >90 mL/min (ref >90)
GFR calc non Af Amer: 80 mL/min — ABNORMAL LOW (ref >90)

## 2013-10-21 LAB — POCT I-STAT 4, (NA,K, GLUC, HGB,HCT)
GLUCOSE: 101 mg/dL — AB (ref 70–99)
GLUCOSE: 121 mg/dL — AB (ref 70–99)
GLUCOSE: 124 mg/dL — AB (ref 70–99)
Glucose, Bld: 99 mg/dL (ref 70–99)
Glucose, Bld: 83 mg/dL (ref 70–99)
Glucose, Bld: 151 mg/dL — ABNORMAL HIGH (ref 70–99)
Glucose, Bld: 157 mg/dL — ABNORMAL HIGH (ref 70–99)
HCT: 33 % — ABNORMAL LOW (ref 39.0–52.0)
HCT: 33 % — ABNORMAL LOW (ref 39.0–52.0)
HCT: 30 % — ABNORMAL LOW (ref 39.0–52.0)
HCT: 26 % — ABNORMAL LOW (ref 39.0–52.0)
HEMATOCRIT: 26 % — AB (ref 39.0–52.0)
HEMATOCRIT: 28 % — AB (ref 39.0–52.0)
HEMATOCRIT: 28 % — AB (ref 39.0–52.0)
HEMOGLOBIN: 10.2 g/dL — AB (ref 13.0–17.0)
HEMOGLOBIN: 8.8 g/dL — AB (ref 13.0–17.0)
Hemoglobin: 11.2 g/dL — ABNORMAL LOW (ref 13.0–17.0)
Hemoglobin: 11.2 g/dL — ABNORMAL LOW (ref 13.0–17.0)
Hemoglobin: 8.8 g/dL — ABNORMAL LOW (ref 13.0–17.0)
Hemoglobin: 9.5 g/dL — ABNORMAL LOW (ref 13.0–17.0)
Hemoglobin: 9.5 g/dL — ABNORMAL LOW (ref 13.0–17.0)
POTASSIUM: 3.9 meq/L (ref 3.7–5.3)
Potassium: 3.6 mEq/L — ABNORMAL LOW (ref 3.7–5.3)
Potassium: 3.8 mEq/L (ref 3.7–5.3)
Potassium: 3.9 mEq/L (ref 3.7–5.3)
Potassium: 3.9 mEq/L (ref 3.7–5.3)
Potassium: 3.5 mEq/L — ABNORMAL LOW (ref 3.7–5.3)
Potassium: 3.3 mEq/L — ABNORMAL LOW (ref 3.7–5.3)
SODIUM: 139 meq/L (ref 137–147)
SODIUM: 138 meq/L (ref 137–147)
Sodium: 138 mEq/L (ref 137–147)
Sodium: 139 mEq/L (ref 137–147)
Sodium: 139 mEq/L (ref 137–147)
Sodium: 139 mEq/L (ref 137–147)
Sodium: 140 mEq/L (ref 137–147)

## 2013-10-21 LAB — BASIC METABOLIC PANEL
BUN: 14 mg/dL (ref 6–23)
CO2: 25 meq/L (ref 19–32)
CREATININE: 0.88 mg/dL (ref 0.50–1.35)
Calcium: 8.7 mg/dL (ref 8.4–10.5)
Chloride: 103 mEq/L (ref 96–112)
GFR calc Af Amer: 83 mL/min — ABNORMAL LOW (ref >90)
GFR calc non Af Amer: 72 mL/min — ABNORMAL LOW (ref >90)
GLUCOSE: 91 mg/dL (ref 70–99)
POTASSIUM: 3.9 meq/L (ref 3.7–5.3)
Sodium: 139 mEq/L (ref 137–147)

## 2013-10-21 LAB — HEMOGLOBIN A1C
Hgb A1c MFr Bld: 6 % — ABNORMAL HIGH (ref <5.7)
Mean Plasma Glucose: 126 mg/dL — ABNORMAL HIGH (ref <117)

## 2013-10-21 LAB — POCT I-STAT 3, VENOUS BLOOD GAS (G3P V)
BICARBONATE: 24.7 meq/L — AB (ref 20.0–24.0)
O2 SAT: 88 %
TCO2: 26 mmol/L (ref 0–100)
pCO2, Ven: 38 mmHg — ABNORMAL LOW (ref 45.0–50.0)
pH, Ven: 7.421 — ABNORMAL HIGH (ref 7.250–7.300)
pO2, Ven: 53 mmHg — ABNORMAL HIGH (ref 30.0–45.0)

## 2013-10-21 LAB — PROTIME-INR
INR: 1.21 (ref 0.00–1.49)
INR: 1.84 — ABNORMAL HIGH (ref 0.00–1.49)
Prothrombin Time: 15 seconds (ref 11.6–15.2)
Prothrombin Time: 20.7 seconds — ABNORMAL HIGH (ref 11.6–15.2)

## 2013-10-21 LAB — HEPARIN LEVEL (UNFRACTIONATED): Heparin Unfractionated: 0.22 IU/mL — ABNORMAL LOW (ref 0.30–0.70)

## 2013-10-21 LAB — POCT I-STAT, CHEM 8
BUN: 8 mg/dL (ref 6–23)
CHLORIDE: 101 meq/L (ref 96–112)
CREATININE: 0.5 mg/dL (ref 0.50–1.35)
Calcium, Ion: 1.19 mmol/L (ref 1.13–1.30)
Glucose, Bld: 106 mg/dL — ABNORMAL HIGH (ref 70–99)
HEMATOCRIT: 24 % — AB (ref 39.0–52.0)
HEMOGLOBIN: 8.2 g/dL — AB (ref 13.0–17.0)
POTASSIUM: 4 meq/L (ref 3.7–5.3)
Sodium: 137 mEq/L (ref 137–147)
TCO2: 25 mmol/L (ref 0–100)

## 2013-10-21 LAB — PLATELET COUNT: Platelets: 277 10*3/uL (ref 150–400)

## 2013-10-21 LAB — MAGNESIUM: Magnesium: 3 mg/dL — ABNORMAL HIGH (ref 1.5–2.5)

## 2013-10-21 LAB — HEMOGLOBIN AND HEMATOCRIT, BLOOD
HCT: 28.1 % — ABNORMAL LOW (ref 39.0–52.0)
Hemoglobin: 9.4 g/dL — ABNORMAL LOW (ref 13.0–17.0)

## 2013-10-21 LAB — PREPARE RBC (CROSSMATCH)

## 2013-10-21 LAB — APTT: APTT: 33 s (ref 24–37)

## 2013-10-21 LAB — SURGICAL PCR SCREEN
MRSA, PCR: NEGATIVE
Staphylococcus aureus: NEGATIVE

## 2013-10-21 SURGERY — CORONARY ARTERY BYPASS GRAFTING (CABG)
Anesthesia: General | Site: Chest

## 2013-10-21 MED ORDER — LACTATED RINGERS IV SOLN
INTRAVENOUS | Status: DC
  Administered 2013-10-21: 15:00:00 via INTRAVENOUS
  Administered 2013-10-22: 20 mL/h via INTRAVENOUS

## 2013-10-21 MED ORDER — OXYCODONE HCL 5 MG PO TABS: 5.0000 mg | ORAL_TABLET | ORAL | Status: DC | PRN

## 2013-10-21 MED ORDER — POTASSIUM CHLORIDE 10 MEQ/50ML IV SOLN
10.0000 meq | INTRAVENOUS | Status: AC
  Administered 2013-10-21 (×3): 10 meq via INTRAVENOUS

## 2013-10-21 MED ORDER — ALBUMIN HUMAN 5 % IV SOLN
250.0000 mL | INTRAVENOUS | Status: AC | PRN
  Administered 2013-10-21: 250 mL via INTRAVENOUS
  Filled 2013-10-21 (×2): qty 250

## 2013-10-21 MED ORDER — PANTOPRAZOLE SODIUM 40 MG PO TBEC
40.0000 mg | DELAYED_RELEASE_TABLET | Freq: Every day | ORAL | Status: DC
  Administered 2013-10-24 – 2013-10-25 (×2): 40 mg via ORAL
  Filled 2013-10-21 (×3): qty 1

## 2013-10-21 MED ORDER — AMIODARONE HCL IN DEXTROSE 360-4.14 MG/200ML-% IV SOLN
60.0000 mg/h | INTRAVENOUS | Status: AC
  Administered 2013-10-21: 60 mg/h via INTRAVENOUS
  Filled 2013-10-21: qty 200

## 2013-10-21 MED ORDER — MORPHINE SULFATE 2 MG/ML IJ SOLN
2.0000 mg | INTRAMUSCULAR | Status: DC | PRN
  Administered 2013-10-21: 2 mg via INTRAVENOUS
  Administered 2013-10-22: 4 mg via INTRAVENOUS
  Administered 2013-10-22 – 2013-10-23 (×2): 2 mg via INTRAVENOUS
  Administered 2013-10-24: 4 mg via INTRAVENOUS
  Administered 2013-10-24 – 2013-10-25 (×2): 2 mg via INTRAVENOUS
  Filled 2013-10-21 (×4): qty 1
  Filled 2013-10-21 (×2): qty 2

## 2013-10-21 MED ORDER — SODIUM CHLORIDE 0.45 % IV SOLN
INTRAVENOUS | Status: DC
  Administered 2013-10-21: 15:00:00 via INTRAVENOUS

## 2013-10-21 MED ORDER — SODIUM CHLORIDE 0.9 % IV SOLN: 250.0000 mL | INTRAVENOUS | Status: DC

## 2013-10-21 MED ORDER — MILRINONE IN DEXTROSE 20 MG/100ML IV SOLN
0.2500 ug/kg/min | INTRAVENOUS | Status: AC
  Administered 2013-10-21: .33 ug/kg/min via INTRAVENOUS
  Filled 2013-10-21: qty 100

## 2013-10-21 MED ORDER — DEXMEDETOMIDINE HCL IN NACL 200 MCG/50ML IV SOLN
0.1000 ug/kg/h | INTRAVENOUS | Status: DC
Start: 2013-10-21 — End: 2013-10-24
  Administered 2013-10-21: 0.7 ug/kg/h via INTRAVENOUS

## 2013-10-21 MED ORDER — SODIUM CHLORIDE 0.9 % IJ SOLN
OROMUCOSAL | Status: DC | PRN
  Administered 2013-10-21 (×3): via TOPICAL

## 2013-10-21 MED ORDER — SODIUM CHLORIDE 0.9 % IV SOLN
INTRAVENOUS | Status: DC
  Administered 2013-10-21: 15:00:00 via INTRAVENOUS
  Administered 2013-10-23: 20 mL via INTRAVENOUS

## 2013-10-21 MED ORDER — METOCLOPRAMIDE HCL 5 MG/ML IJ SOLN
10.0000 mg | Freq: Four times a day (QID) | INTRAMUSCULAR | Status: AC
  Administered 2013-10-21 – 2013-10-22 (×3): 10 mg via INTRAVENOUS
  Filled 2013-10-21 (×3): qty 2

## 2013-10-21 MED ORDER — ACETAMINOPHEN 650 MG RE SUPP
650.0000 mg | Freq: Once | RECTAL | Status: AC
  Administered 2013-10-21: 650 mg via RECTAL

## 2013-10-21 MED ORDER — SODIUM CHLORIDE 0.9 % IJ SOLN
3.0000 mL | Freq: Two times a day (BID) | INTRAMUSCULAR | Status: DC
  Administered 2013-10-22 – 2013-10-25 (×7): 3 mL via INTRAVENOUS

## 2013-10-21 MED ORDER — METOPROLOL TARTRATE 12.5 MG HALF TABLET
12.5000 mg | ORAL_TABLET | Freq: Two times a day (BID) | ORAL | Status: DC
  Administered 2013-10-22 – 2013-10-23 (×2): 12.5 mg via ORAL
  Filled 2013-10-21 (×7): qty 1

## 2013-10-21 MED ORDER — PROTAMINE SULFATE 10 MG/ML IV SOLN
INTRAVENOUS | Status: DC | PRN
  Administered 2013-10-21: 200 mg via INTRAVENOUS

## 2013-10-21 MED ORDER — AMIODARONE HCL IN DEXTROSE 360-4.14 MG/200ML-% IV SOLN
INTRAVENOUS | Status: AC
  Administered 2013-10-21: 60 mg/h
  Filled 2013-10-21: qty 200

## 2013-10-21 MED ORDER — NITROGLYCERIN IN D5W 200-5 MCG/ML-% IV SOLN: 0.0000 ug/min | INTRAVENOUS | Status: DC

## 2013-10-21 MED ORDER — MIDAZOLAM HCL 2 MG/2ML IJ SOLN
2.0000 mg | INTRAMUSCULAR | Status: DC | PRN
Start: 2013-10-21 — End: 2013-10-24
  Filled 2013-10-21: qty 2

## 2013-10-21 MED ORDER — DOPAMINE-DEXTROSE 3.2-5 MG/ML-% IV SOLN: 0.0000 ug/kg/min | INTRAVENOUS | Status: DC

## 2013-10-21 MED ORDER — INSULIN REGULAR BOLUS VIA INFUSION
0.0000 [IU] | Freq: Three times a day (TID) | INTRAVENOUS | Status: DC
  Filled 2013-10-21: qty 10

## 2013-10-21 MED ORDER — AMIODARONE HCL IN DEXTROSE 360-4.14 MG/200ML-% IV SOLN
INTRAVENOUS | Status: DC | PRN
  Administered 2013-10-21: 60 mg/h via INTRAVENOUS

## 2013-10-21 MED ORDER — METOPROLOL TARTRATE 25 MG/10 ML ORAL SUSPENSION
12.5000 mg | Freq: Two times a day (BID) | ORAL | Status: DC
  Filled 2013-10-21 (×7): qty 5

## 2013-10-21 MED ORDER — CALCIUM CHLORIDE 10 % IV SOLN
INTRAVENOUS | Status: DC | PRN
  Administered 2013-10-21 (×4): 250 mg via INTRAVENOUS

## 2013-10-21 MED ORDER — CEFUROXIME SODIUM 1.5 G IJ SOLR
1.5000 g | Freq: Two times a day (BID) | INTRAMUSCULAR | Status: AC
  Administered 2013-10-21 – 2013-10-23 (×4): 1.5 g via INTRAVENOUS
  Filled 2013-10-21 (×4): qty 1.5

## 2013-10-21 MED ORDER — ALBUMIN HUMAN 5 % IV SOLN
INTRAVENOUS | Status: DC | PRN
  Administered 2013-10-21 (×2): via INTRAVENOUS

## 2013-10-21 MED ORDER — MILRINONE IN DEXTROSE 20 MG/100ML IV SOLN: 0.3000 ug/kg/min | INTRAVENOUS | Status: DC

## 2013-10-21 MED ORDER — ROCURONIUM BROMIDE 100 MG/10ML IV SOLN
INTRAVENOUS | Status: DC | PRN
  Administered 2013-10-21: 25 mg via INTRAVENOUS
  Administered 2013-10-21: 50 mg via INTRAVENOUS
  Administered 2013-10-21: 25 mg via INTRAVENOUS

## 2013-10-21 MED ORDER — PHENYLEPHRINE HCL 10 MG/ML IJ SOLN
0.0000 ug/min | INTRAVENOUS | Status: DC
  Administered 2013-10-22: 30 ug/min via INTRAVENOUS
  Filled 2013-10-21 (×6): qty 2

## 2013-10-21 MED ORDER — PROPOFOL 10 MG/ML IV BOLUS
INTRAVENOUS | Status: DC | PRN
  Administered 2013-10-21: 30 mg via INTRAVENOUS

## 2013-10-21 MED ORDER — ACETAMINOPHEN 500 MG PO TABS
1000.0000 mg | ORAL_TABLET | Freq: Four times a day (QID) | ORAL | Status: DC
  Administered 2013-10-22 – 2013-10-24 (×11): 1000 mg via ORAL
  Filled 2013-10-21 (×20): qty 2

## 2013-10-21 MED ORDER — AMIODARONE HCL IN DEXTROSE 360-4.14 MG/200ML-% IV SOLN
30.0000 mg/h | INTRAVENOUS | Status: DC
  Administered 2013-10-21 – 2013-10-25 (×11): 30 mg/h via INTRAVENOUS
  Filled 2013-10-21 (×20): qty 200

## 2013-10-21 MED ORDER — ASPIRIN EC 325 MG PO TBEC
325.0000 mg | DELAYED_RELEASE_TABLET | Freq: Every day | ORAL | Status: DC
  Administered 2013-10-22 – 2013-10-25 (×3): 325 mg via ORAL
  Filled 2013-10-21 (×5): qty 1

## 2013-10-21 MED ORDER — MORPHINE SULFATE 2 MG/ML IJ SOLN
1.0000 mg | INTRAMUSCULAR | Status: AC | PRN
  Administered 2013-10-21 (×3): 2 mg via INTRAVENOUS
  Filled 2013-10-21 (×2): qty 1
  Filled 2013-10-21: qty 2

## 2013-10-21 MED ORDER — HEMOSTATIC AGENTS (NO CHARGE) OPTIME
TOPICAL | Status: DC | PRN
  Administered 2013-10-21 (×2): 1 via TOPICAL

## 2013-10-21 MED ORDER — PHENYLEPHRINE HCL 10 MG/ML IJ SOLN
30.0000 ug/min | INTRAVENOUS | Status: AC
  Administered 2013-10-21: 60 ug/min via INTRAVENOUS
  Filled 2013-10-21: qty 2

## 2013-10-21 MED ORDER — DOCUSATE SODIUM 100 MG PO CAPS
200.0000 mg | ORAL_CAPSULE | Freq: Every day | ORAL | Status: DC
  Administered 2013-10-22 – 2013-10-25 (×3): 200 mg via ORAL
  Filled 2013-10-21 (×3): qty 2

## 2013-10-21 MED ORDER — FENTANYL CITRATE 0.05 MG/ML IJ SOLN
INTRAMUSCULAR | Status: DC | PRN
  Administered 2013-10-21: 750 ug via INTRAVENOUS
  Administered 2013-10-21: 250 ug via INTRAVENOUS

## 2013-10-21 MED ORDER — SODIUM CHLORIDE 0.9 % IV SOLN
INTRAVENOUS | Status: DC
  Administered 2013-10-21: 4.9 [IU]/h via INTRAVENOUS
  Administered 2013-10-21: 3.6 [IU]/h via INTRAVENOUS
  Filled 2013-10-21: qty 1

## 2013-10-21 MED ORDER — ARTIFICIAL TEARS OP OINT
TOPICAL_OINTMENT | OPHTHALMIC | Status: DC | PRN
  Administered 2013-10-21: 1 via OPHTHALMIC

## 2013-10-21 MED ORDER — LACTATED RINGERS IV SOLN: 500.0000 mL | Freq: Once | INTRAVENOUS | Status: AC | PRN

## 2013-10-21 MED ORDER — MAGNESIUM SULFATE 40 MG/ML IJ SOLN
4.0000 g | Freq: Once | INTRAMUSCULAR | Status: AC
  Administered 2013-10-21: 4 g via INTRAVENOUS
  Filled 2013-10-21: qty 100

## 2013-10-21 MED ORDER — FAMOTIDINE IN NACL 20-0.9 MG/50ML-% IV SOLN
20.0000 mg | Freq: Two times a day (BID) | INTRAVENOUS | Status: AC
  Administered 2013-10-21 (×2): 20 mg via INTRAVENOUS
  Filled 2013-10-21 (×2): qty 50

## 2013-10-21 MED ORDER — LACTATED RINGERS IV SOLN
INTRAVENOUS | Status: DC | PRN
Start: 2013-10-21 — End: 2013-10-21
  Administered 2013-10-21: 08:00:00 via INTRAVENOUS

## 2013-10-21 MED ORDER — ACETAMINOPHEN 160 MG/5ML PO SOLN: 650.0000 mg | Freq: Once | ORAL | Status: AC

## 2013-10-21 MED ORDER — ONDANSETRON HCL 4 MG/2ML IJ SOLN
4.0000 mg | Freq: Four times a day (QID) | INTRAMUSCULAR | Status: DC | PRN
  Administered 2013-10-22 – 2013-10-25 (×4): 4 mg via INTRAVENOUS
  Filled 2013-10-21 (×4): qty 2

## 2013-10-21 MED ORDER — AMIODARONE LOAD VIA INFUSION
150.0000 mg | Freq: Once | INTRAVENOUS | Status: AC
  Administered 2013-10-21: 150 mg via INTRAVENOUS
  Filled 2013-10-21: qty 83.34

## 2013-10-21 MED ORDER — ASPIRIN 81 MG PO CHEW: 324.0000 mg | CHEWABLE_TABLET | Freq: Every day | ORAL | Status: DC

## 2013-10-21 MED ORDER — SODIUM CHLORIDE 0.9 % IJ SOLN: 3.0000 mL | INTRAMUSCULAR | Status: DC | PRN

## 2013-10-21 MED ORDER — ACETAMINOPHEN 160 MG/5ML PO SOLN
1000.0000 mg | Freq: Four times a day (QID) | ORAL | Status: DC
  Filled 2013-10-21: qty 40.6

## 2013-10-21 MED ORDER — BISACODYL 5 MG PO TBEC
10.0000 mg | DELAYED_RELEASE_TABLET | Freq: Every day | ORAL | Status: DC
  Administered 2013-10-22 – 2013-10-25 (×3): 10 mg via ORAL
  Filled 2013-10-21 (×3): qty 2

## 2013-10-21 MED ORDER — VANCOMYCIN HCL IN DEXTROSE 1-5 GM/200ML-% IV SOLN
1000.0000 mg | Freq: Once | INTRAVENOUS | Status: AC
  Administered 2013-10-21: 1000 mg via INTRAVENOUS
  Filled 2013-10-21: qty 200

## 2013-10-21 MED ORDER — METOPROLOL TARTRATE 1 MG/ML IV SOLN
2.5000 mg | INTRAVENOUS | Status: DC | PRN
  Administered 2013-10-25: 2.5 mg via INTRAVENOUS
  Filled 2013-10-21: qty 5

## 2013-10-21 MED ORDER — BISACODYL 10 MG RE SUPP: 10.0000 mg | Freq: Every day | RECTAL | Status: DC

## 2013-10-21 MED ORDER — HEPARIN SODIUM (PORCINE) 1000 UNIT/ML IJ SOLN
INTRAMUSCULAR | Status: DC | PRN
  Administered 2013-10-21: 21000 [IU] via INTRAVENOUS

## 2013-10-21 MED ORDER — MIDAZOLAM HCL 5 MG/5ML IJ SOLN
INTRAMUSCULAR | Status: DC | PRN
  Administered 2013-10-21 (×2): 2 mg via INTRAVENOUS

## 2013-10-21 MED ORDER — AMIODARONE HCL IN DEXTROSE 360-4.14 MG/200ML-% IV SOLN
60.0000 mg/h | INTRAVENOUS | Status: DC
  Filled 2013-10-21: qty 200

## 2013-10-21 MED ORDER — LIDOCAINE HCL (CARDIAC) 20 MG/ML IV SOLN
INTRAVENOUS | Status: DC | PRN
  Administered 2013-10-21: 100 mg via INTRAVENOUS

## 2013-10-21 SURGICAL SUPPLY — 130 items
ADAPTER CARDIO PERF ANTE/RETRO (ADAPTER) ×4 IMPLANT
ADH SKN CLS APL DERMABOND .7 (GAUZE/BANDAGES/DRESSINGS) ×6
ADPR PRFSN 84XANTGRD RTRGD (ADAPTER) ×3
APPLICATOR COTTON TIP 6IN STRL (MISCELLANEOUS) ×1 IMPLANT
ATRICLIP EXCLUSION 40 STD HAND (Clip) ×1 IMPLANT
ATTRACTOMAT 16X20 MAGNETIC DRP (DRAPES) ×4 IMPLANT
BAG DECANTER FOR FLEXI CONT (MISCELLANEOUS) ×4 IMPLANT
BANDAGE ELASTIC 4 VELCRO ST LF (GAUZE/BANDAGES/DRESSINGS) ×5 IMPLANT
BANDAGE ELASTIC 6 VELCRO ST LF (GAUZE/BANDAGES/DRESSINGS) ×5 IMPLANT
BANDAGE GAUZE ELAST BULKY 4 IN (GAUZE/BANDAGES/DRESSINGS) ×4 IMPLANT
BLADE STERNUM SYSTEM 6 (BLADE) ×4 IMPLANT
BLADE SURG 11 STRL SS (BLADE) ×1 IMPLANT
BLADE SURG 15 STRL LF DISP TIS (BLADE) ×3 IMPLANT
BLADE SURG 15 STRL SS (BLADE) ×4
BNDG GAUZE ELAST 4 BULKY (GAUZE/BANDAGES/DRESSINGS) ×2 IMPLANT
CANISTER SUCTION 2500CC (MISCELLANEOUS) ×4 IMPLANT
CANN PRFSN .5XCNCT 15X34-48 (MISCELLANEOUS) ×3
CANNULA AORTIC HI-FLOW 6.5M20F (CANNULA) ×4 IMPLANT
CANNULA ARTERIAL NVNT 3/8 22FR (MISCELLANEOUS) IMPLANT
CANNULA GUNDRY RCSP 15FR (MISCELLANEOUS) ×1 IMPLANT
CANNULA PRFSN .5XCNCT 15X34-48 (MISCELLANEOUS) ×3 IMPLANT
CANNULA VEN 1 STAGE STR 66236 (MISCELLANEOUS) IMPLANT
CANNULA VEN 2 STAGE (MISCELLANEOUS) ×4
CARDIAC SUCTION (MISCELLANEOUS) ×5 IMPLANT
CATH CPB KIT GERHARDT (MISCELLANEOUS) ×4 IMPLANT
CATH HEART VENT LEFT (CATHETERS) ×3 IMPLANT
CATH ROBINSON RED A/P 18FR (CATHETERS) ×2 IMPLANT
CATH THORACIC 28FR (CATHETERS) ×4 IMPLANT
CLIP FOGARTY SPRING 6M (CLIP) ×1 IMPLANT
CLIP RETRACTION 3.0MM CORONARY (MISCELLANEOUS) ×1 IMPLANT
CONN 1/2X1/2X1/2  BEN (MISCELLANEOUS) ×1
CONN 1/2X1/2X1/2 BEN (MISCELLANEOUS) ×3 IMPLANT
CONN 3/8X1/2 ST GISH (MISCELLANEOUS) ×8 IMPLANT
CONN Y 3/8X3/8X3/8  BEN (MISCELLANEOUS)
CONN Y 3/8X3/8X3/8 BEN (MISCELLANEOUS) IMPLANT
CONT SPEC 4OZ CLIKSEAL STRL BL (MISCELLANEOUS) ×1 IMPLANT
COVER SURGICAL LIGHT HANDLE (MISCELLANEOUS) ×8 IMPLANT
CRADLE DONUT ADULT HEAD (MISCELLANEOUS) ×4 IMPLANT
DERMABOND ADVANCED (GAUZE/BANDAGES/DRESSINGS) ×2
DERMABOND ADVANCED .7 DNX12 (GAUZE/BANDAGES/DRESSINGS) IMPLANT
DRAIN CHANNEL 28F RND 3/8 FF (WOUND CARE) ×4 IMPLANT
DRAPE CARDIOVASCULAR INCISE (DRAPES) ×4
DRAPE SLUSH/WARMER DISC (DRAPES) ×4 IMPLANT
DRAPE SRG 135X102X78XABS (DRAPES) ×3 IMPLANT
DRSG AQUACEL AG ADV 3.5X10 (GAUZE/BANDAGES/DRESSINGS) ×1 IMPLANT
DRSG AQUACEL AG ADV 3.5X14 (GAUZE/BANDAGES/DRESSINGS) ×12 IMPLANT
ELECT BLADE 4.0 EZ CLEAN MEGAD (MISCELLANEOUS) ×4
ELECT CAUTERY BLADE 6.4 (BLADE) IMPLANT
ELECT REM PT RETURN 9FT ADLT (ELECTROSURGICAL) ×8
ELECTRODE BLDE 4.0 EZ CLN MEGD (MISCELLANEOUS) ×3 IMPLANT
ELECTRODE REM PT RTRN 9FT ADLT (ELECTROSURGICAL) ×6 IMPLANT
GLOVE BIO SURGEON STRL SZ 6 (GLOVE) ×5 IMPLANT
GLOVE BIO SURGEON STRL SZ 6.5 (GLOVE) ×12 IMPLANT
GLOVE BIO SURGEON STRL SZ7 (GLOVE) IMPLANT
GLOVE BIOGEL PI IND STRL 6 (GLOVE) IMPLANT
GLOVE BIOGEL PI IND STRL 6.5 (GLOVE) IMPLANT
GLOVE BIOGEL PI INDICATOR 6 (GLOVE) ×8
GLOVE BIOGEL PI INDICATOR 6.5 (GLOVE) ×1
GOWN STRL NON-REIN LRG LVL3 (GOWN DISPOSABLE) ×20 IMPLANT
HEMOSTAT POWDER SURGIFOAM 1G (HEMOSTASIS) ×12 IMPLANT
HEMOSTAT SURGICEL 2X14 (HEMOSTASIS) ×4 IMPLANT
INSERT FOGARTY XLG (MISCELLANEOUS) IMPLANT
KIT BASIN OR (CUSTOM PROCEDURE TRAY) ×4 IMPLANT
KIT ROOM TURNOVER OR (KITS) ×4 IMPLANT
KIT SUCTION CATH 14FR (SUCTIONS) ×8 IMPLANT
KIT VASOVIEW W/TROCAR VH 2000 (KITS) ×4 IMPLANT
LEAD PACING MYOCARDI (MISCELLANEOUS) ×4 IMPLANT
LINE VENT (MISCELLANEOUS) ×1 IMPLANT
LOOP VESSEL SUPERMAXI WHITE (MISCELLANEOUS) ×1 IMPLANT
MARKER GRAFT CORONARY BYPASS (MISCELLANEOUS) ×12 IMPLANT
NS IRRIG 1000ML POUR BTL (IV SOLUTION) ×20 IMPLANT
PACK OPEN HEART (CUSTOM PROCEDURE TRAY) ×4 IMPLANT
PAD ARMBOARD 7.5X6 YLW CONV (MISCELLANEOUS) ×8 IMPLANT
PAD ELECT DEFIB RADIOL ZOLL (MISCELLANEOUS) ×4 IMPLANT
PENCIL BUTTON HOLSTER BLD 10FT (ELECTRODE) ×4 IMPLANT
PUNCH AORTIC ROTATE 4.0MM (MISCELLANEOUS) ×1 IMPLANT
SET CARDIOPLEGIA MPS 5001102 (MISCELLANEOUS) ×1 IMPLANT
SOLUTION ANTI FOG 6CC (MISCELLANEOUS) ×1 IMPLANT
SPONGE GAUZE 2X2 NONSTERILE (GAUZE/BANDAGES/DRESSINGS) ×4 IMPLANT
SPONGE GAUZE 4X4 12PLY (GAUZE/BANDAGES/DRESSINGS) ×8 IMPLANT
SPONGE GAUZE 4X4 12PLY STER LF (GAUZE/BANDAGES/DRESSINGS) ×3 IMPLANT
SPONGE LAP 18X18 X RAY DECT (DISPOSABLE) ×2 IMPLANT
SUCKER INTRACARDIAC WEIGHTED (SUCKER) ×1 IMPLANT
SUCKER WEIGHTED FLEX (MISCELLANEOUS) ×4 IMPLANT
SUT BONE WAX W31G (SUTURE) ×4 IMPLANT
SUT ETHIBOND 2 0 SH (SUTURE) ×24 IMPLANT
SUT ETHIBOND 2 0 SH 36X2 (SUTURE) ×12 IMPLANT
SUT ETHIBOND 2 0 V4 (SUTURE) IMPLANT
SUT ETHIBOND 2 0V4 GREEN (SUTURE) IMPLANT
SUT MNCRL AB 4-0 PS2 18 (SUTURE) ×1 IMPLANT
SUT PROLENE 3 0 SH 1 (SUTURE) ×4 IMPLANT
SUT PROLENE 3 0 SH1 36 (SUTURE) ×5 IMPLANT
SUT PROLENE 4 0 RB 1 (SUTURE) ×16
SUT PROLENE 4 0 TF (SUTURE) ×8 IMPLANT
SUT PROLENE 4-0 RB1 .5 CRCL 36 (SUTURE) ×6 IMPLANT
SUT PROLENE 5 0 C 1 36 (SUTURE) ×9 IMPLANT
SUT PROLENE 5 0 CC1 (SUTURE) IMPLANT
SUT PROLENE 6 0 C 1 30 (SUTURE) ×1 IMPLANT
SUT PROLENE 6 0 CC (SUTURE) ×8 IMPLANT
SUT PROLENE 7 0 BV 1 (SUTURE) ×1 IMPLANT
SUT PROLENE 7 0 BV1 MDA (SUTURE) ×4 IMPLANT
SUT PROLENE 8 0 BV175 6 (SUTURE) ×3 IMPLANT
SUT SILK  1 MH (SUTURE) ×2
SUT SILK 1 MH (SUTURE) ×6 IMPLANT
SUT SILK 1 TIES 10X30 (SUTURE) ×4 IMPLANT
SUT SILK 2 0 (SUTURE) ×4
SUT SILK 2 0 SH CR/8 (SUTURE) ×8 IMPLANT
SUT SILK 2-0 18XBRD TIE 12 (SUTURE) ×3 IMPLANT
SUT SILK 3 0 SH CR/8 (SUTURE) ×4 IMPLANT
SUT SILK 4 0 (SUTURE) ×4
SUT SILK 4-0 18XBRD TIE 12 (SUTURE) ×3 IMPLANT
SUT STEEL 6MS V (SUTURE) ×4 IMPLANT
SUT STEEL SZ 6 DBL 3X14 BALL (SUTURE) ×4 IMPLANT
SUT TEM PAC WIRE 2 0 SH (SUTURE) ×16 IMPLANT
SUT VIC AB 1 CTX 18 (SUTURE) ×8 IMPLANT
SUT VIC AB 2-0 CT1 27 (SUTURE) ×4
SUT VIC AB 2-0 CT1 TAPERPNT 27 (SUTURE) IMPLANT
SUT VIC AB 2-0 CTX 27 (SUTURE) IMPLANT
SUT VIC AB 3-0 X1 27 (SUTURE) IMPLANT
SUTURE E-PAK OPEN HEART (SUTURE) ×4 IMPLANT
SYSTEM SAHARA CHEST DRAIN ATS (WOUND CARE) ×4 IMPLANT
TAPE CLOTH SURG 4X10 WHT LF (GAUZE/BANDAGES/DRESSINGS) ×1 IMPLANT
TOWEL OR 17X24 6PK STRL BLUE (TOWEL DISPOSABLE) ×8 IMPLANT
TOWEL OR 17X26 10 PK STRL BLUE (TOWEL DISPOSABLE) ×8 IMPLANT
TRAY FOLEY IC TEMP SENS 14FR (CATHETERS) ×4 IMPLANT
TUBE FEEDING 8FR 16IN STR KANG (MISCELLANEOUS) ×4 IMPLANT
TUBING INSUFFLATION 10FT LAP (TUBING) ×4 IMPLANT
UNDERPAD 30X30 INCONTINENT (UNDERPADS AND DIAPERS) ×4 IMPLANT
VENT LEFT HEART 12002 (CATHETERS) ×4
WATER STERILE IRR 1000ML POUR (IV SOLUTION) ×8 IMPLANT

## 2013-10-21 NOTE — Anesthesia Preprocedure Evaluation (Signed)
Anesthesia Evaluation  Patient identified by MRN, date of birth, ID band Patient awake    Reviewed: Allergy & Precautions, H&P , NPO status , Patient's Chart, lab work & pertinent test results  Airway Mallampati: I TM Distance: >3 FB Neck ROM: Full    Dental   Pulmonary former smoker,          Cardiovascular hypertension, Pt. on medications + CAD and + Past MI + dysrhythmias Atrial Fibrillation     Neuro/Psych    GI/Hepatic   Endo/Other    Renal/GU      Musculoskeletal   Abdominal   Peds  Hematology   Anesthesia Other Findings   Reproductive/Obstetrics                           Anesthesia Physical Anesthesia Plan  ASA: III  Anesthesia Plan: General   Post-op Pain Management:    Induction: Intravenous  Airway Management Planned: Oral ETT  Additional Equipment: Arterial line, CVP, PA Cath, 3D TEE and Ultrasound Guidance Line Placement  Intra-op Plan:   Post-operative Plan: Post-operative intubation/ventilation  Informed Consent: I have reviewed the patients History and Physical, chart, labs and discussed the procedure including the risks, benefits and alternatives for the proposed anesthesia with the patient or authorized representative who has indicated his/her understanding and acceptance.     Plan Discussed with: CRNA and Surgeon  Anesthesia Plan Comments:         Anesthesia Quick Evaluation

## 2013-10-21 NOTE — Anesthesia Postprocedure Evaluation (Signed)
Anesthesia Post Note  Patient: [name]  Procedure(s) Performed: Procedure(s) (LRB): CORONARY ARTERY BYPASS GRAFTING (CABG) (N/A) INTRAOPERATIVE TRANSESOPHAGEAL ECHOCARDIOGRAM (N/A) CLIPPING OF ATRIAL APPENDAGE  Anesthesia type: General  Patient location: ICU  Post pain: Pain level controlled  Post assessment: Post-op Vital signs reviewed  Last Vitals:  Filed Vitals:   10/21/13 1515  BP: 92/55  Pulse: 120  Temp: 34.7 C  Resp: 16    Post vital signs: stable  Level of consciousness: Patient remains intubated per anesthesia plan  Complications: No apparent anesthesia complications

## 2013-10-21 NOTE — Progress Notes (Signed)
Echocardiogram Echocardiogram Transesophageal has been performed.  [name] 10/21/2013, 9:50 AM

## 2013-10-21 NOTE — Progress Notes (Signed)
Patient ID: [name], male   DOB: 02/11/1918, 80 y.o.   MRN: [id number]  SICU Evening Rounds:  Vasodilated postop on 70 mcg neo, dop 5. CI= 1.5  PA 44/28  Asleep on vent  Urine output ok  CT output low.  PM labs pending.  Plan to wean vent as tolerated.

## 2013-10-21 NOTE — Progress Notes (Deleted)
Echocardiogram 2D Echocardiogram has been performed.  [name] 10/21/2013, 9:47 AM

## 2013-10-21 NOTE — Transfer of Care (Signed)
Immediate Anesthesia Transfer of Care Note  Patient: [name]  Procedure(s) Performed: Procedure(s): CORONARY ARTERY BYPASS GRAFTING (CABG) (N/A) INTRAOPERATIVE TRANSESOPHAGEAL ECHOCARDIOGRAM (N/A) CLIPPING OF ATRIAL APPENDAGE  Patient Location: SICU  Anesthesia Type:General  Level of Consciousness: sedated, responds to stimulation and Patient remains intubated per anesthesia plan  Airway & Oxygen Therapy: Patient remains intubated per anesthesia plan and Patient placed on Ventilator (see vital sign flow sheet for setting)  Post-op Assessment: Report given to PACU RN and Post -op Vital signs reviewed and stable  Post vital signs: Reviewed and stable  Complications: No apparent anesthesia complications

## 2013-10-21 NOTE — Brief Op Note (Addendum)
      [address]       [place] [address]             [phone]       10/21/2013  12:25 PM  PATIENT:  [name]  80 y.o. male  PRE-OPERATIVE DIAGNOSIS:  Coronary artery disease, with critical left main, chronic AFib  POST-OPERATIVE DIAGNOSIS:  Same, incidental enlarged mammary chain lymph node  PROCEDURE:  Procedure(s) with comments:  CORONARY ARTERY BYPASS GRAFTING x 4 -LIMA to LAD -Sequential SVG to Distal Left Circumflex and OM -SVG to Distal RCA Mammary chain lymph node bx Application of Left Atrial Clip -40 Atricure  ENDOSCOPIC SAPHENOUS VEIN HARVEST RIGHT AND LEFT THIGH  INTRAOPERATIVE TRANSESOPHAGEAL ECHOCARDIOGRAM (N/A)  SURGEON:  Surgeon(s) and Role:    * Grace Isaac, MD - Primary  PHYSICIAN ASSISTANT: [name] [name] PA-C  ANESTHESIA:   general  EBL:  Total I/O In: -  Out: 825 [Urine:825]  BLOOD ADMINISTERED: CELLSAVER  DRAINS: Left Pleural Chest tube, Mediastinal Chest tubes   LOCAL MEDICATIONS USED:  NONE  SPECIMEN:  Source of Specimen:  Left Intramammary Lymph Node  DISPOSITION OF SPECIMEN:  PATHOLOGY  COUNTS:  YES  TOURNIQUET:  * No tourniquets in log *  DICTATION: .Dragon Dictation  PLAN OF CARE: Admit to inpatient   PATIENT DISPOSITION:  ICU - intubated and hemodynamically stable.   Delay start of Pharmacological VTE agent (>24hrs) due to surgical blood loss or risk of bleeding: yes

## 2013-10-21 NOTE — Procedures (Signed)
Extubation Procedure Note  Patient Details:   Name: [name] DOB: August 03, 1918 MRN: [id number]   Airway Documentation:   Pre extubation: Pt follows all commands. Pt preformed Rapid Wean without difficulty. ABG in acceptable range. NIF -24, VC 850. Cuff leak audible.  Post extubation: Pt placed on 4L [place]/Sats 97%, Pt able to verbalize name, oriented to place. No stridor noted. IS 1000ML. RT/RN at bedside. RT will continue to monitor.  Evaluation  O2 sats: stable throughout Complications: No apparent complications Patient did tolerate procedure well. Bilateral Breath Sounds: Clear;Diminished   Yes  Sharen Hint 10/21/2013, 9:50 PM

## 2013-10-21 NOTE — Progress Notes (Signed)
ANTICOAGULATION CONSULT NOTE - Follow Up Consult  Pharmacy Consult for heparin Indication: chest pain/ACS and atrial fibrillation  Labs:  Recent Labs  10/18/13 0830 10/19/13 0615 10/19/13 2047 10/20/13 0435 10/21/13 0200 10/21/13 0225  HGB 12.6* 13.1  --  12.3*  --  12.2*  HCT 36.9* 38.2*  --  36.7*  --  36.8*  PLT 411* 441*  --  431*  --  415*  LABPROT 32.5* 26.7* 19.8* 16.9*  --  15.0  INR 3.32* 2.57* 1.74* 1.41  --  1.21  HEPARINUNFRC  --   --   --  <0.10* 0.22*  --   CREATININE 0.79  --   --  0.81  --  0.88  TROPONINI <0.30  --   --   --   --   --     Assessment: 80yo male subtherapeutic on heparin after rate increase, plan for OSH this am.  Goal of Therapy:  Heparin level 0.3-0.7 units/ml   Plan:  Will increase heparin gtt by 1-2 units/kg/hr to 1100 units/hr until OSH and f/u regarding anticoag post-op for Afib.  Wynona Neat, PharmD, BCPS  10/21/2013,4:42 AM

## 2013-10-22 ENCOUNTER — Inpatient Hospital Stay (HOSPITAL_COMMUNITY): Payer: Medicare Other

## 2013-10-22 LAB — BASIC METABOLIC PANEL
BUN: 10 mg/dL (ref 6–23)
CALCIUM: 8.1 mg/dL — AB (ref 8.4–10.5)
CHLORIDE: 101 meq/L (ref 96–112)
CO2: 21 meq/L (ref 19–32)
Creatinine, Ser: 0.71 mg/dL (ref 0.50–1.35)
GFR calc Af Amer: >90 mL/min (ref >90)
GFR calc non Af Amer: 78 mL/min — ABNORMAL LOW (ref >90)
GLUCOSE: 118 mg/dL — AB (ref 70–99)
Potassium: 4.2 mEq/L (ref 3.7–5.3)
SODIUM: 136 meq/L — AB (ref 137–147)

## 2013-10-22 LAB — CBC
HCT: 25.4 % — ABNORMAL LOW (ref 39.0–52.0)
HCT: 25.4 % — ABNORMAL LOW (ref 39.0–52.0)
HEMOGLOBIN: 8.7 g/dL — AB (ref 13.0–17.0)
HEMOGLOBIN: 8.7 g/dL — AB (ref 13.0–17.0)
MCH: 30.6 pg (ref 26.0–34.0)
MCH: 30.9 pg (ref 26.0–34.0)
MCHC: 34.3 g/dL (ref 30.0–36.0)
MCHC: 34.3 g/dL (ref 30.0–36.0)
MCV: 89.4 fL (ref 78.0–100.0)
MCV: 90.1 fL (ref 78.0–100.0)
Platelets: 251 10*3/uL (ref 150–400)
Platelets: 236 10*3/uL (ref 150–400)
RBC: 2.84 MIL/uL — AB (ref 4.22–5.81)
RBC: 2.82 MIL/uL — ABNORMAL LOW (ref 4.22–5.81)
RDW: 14.6 % (ref 11.5–15.5)
RDW: 15.2 % (ref 11.5–15.5)
WBC: 12 10*3/uL — AB (ref 4.0–10.5)
WBC: 14 10*3/uL — ABNORMAL HIGH (ref 4.0–10.5)

## 2013-10-22 LAB — POCT I-STAT, CHEM 8
BUN: 15 mg/dL (ref 6–23)
CALCIUM ION: 1.19 mmol/L (ref 1.13–1.30)
CHLORIDE: 97 meq/L (ref 96–112)
CREATININE: 1.3 mg/dL (ref 0.50–1.35)
Glucose, Bld: 183 mg/dL — ABNORMAL HIGH (ref 70–99)
HCT: 28 % — ABNORMAL LOW (ref 39.0–52.0)
Hemoglobin: 9.5 g/dL — ABNORMAL LOW (ref 13.0–17.0)
Potassium: 4.3 mEq/L (ref 3.7–5.3)
Sodium: 132 mEq/L — ABNORMAL LOW (ref 137–147)
TCO2: 21 mmol/L (ref 0–100)

## 2013-10-22 LAB — CREATININE, SERUM
Creatinine, Ser: 1.22 mg/dL (ref 0.50–1.35)
GFR calc Af Amer: 57 mL/min — ABNORMAL LOW (ref >90)
GFR calc non Af Amer: 49 mL/min — ABNORMAL LOW (ref >90)

## 2013-10-22 LAB — MAGNESIUM
Magnesium: 2.6 mg/dL — ABNORMAL HIGH (ref 1.5–2.5)
Magnesium: 2.4 mg/dL (ref 1.5–2.5)

## 2013-10-22 LAB — GLUCOSE, CAPILLARY
GLUCOSE-CAPILLARY: 100 mg/dL — AB (ref 70–99)
GLUCOSE-CAPILLARY: 95 mg/dL (ref 70–99)
GLUCOSE-CAPILLARY: 97 mg/dL (ref 70–99)
Glucose-Capillary: 108 mg/dL — ABNORMAL HIGH (ref 70–99)
Glucose-Capillary: 97 mg/dL (ref 70–99)
Glucose-Capillary: 97 mg/dL (ref 70–99)
Glucose-Capillary: 120 mg/dL — ABNORMAL HIGH (ref 70–99)
Glucose-Capillary: 119 mg/dL — ABNORMAL HIGH (ref 70–99)
Glucose-Capillary: 118 mg/dL — ABNORMAL HIGH (ref 70–99)
Glucose-Capillary: 163 mg/dL — ABNORMAL HIGH (ref 70–99)

## 2013-10-22 LAB — TSH: TSH: 1 u[IU]/mL (ref 0.350–4.500)

## 2013-10-22 MED ORDER — INSULIN DETEMIR 100 UNIT/ML ~~LOC~~ SOLN
4.0000 [IU] | Freq: Once | SUBCUTANEOUS | Status: AC
  Administered 2013-10-22: 4 [IU] via SUBCUTANEOUS
  Filled 2013-10-22: qty 0.04

## 2013-10-22 MED ORDER — INSULIN DETEMIR 100 UNIT/ML ~~LOC~~ SOLN
4.0000 [IU] | Freq: Every day | SUBCUTANEOUS | Status: DC
  Administered 2013-10-23 – 2013-10-27 (×5): 4 [IU] via SUBCUTANEOUS
  Filled 2013-10-22 (×6): qty 0.04

## 2013-10-22 MED ORDER — INSULIN ASPART 100 UNIT/ML ~~LOC~~ SOLN
0.0000 [IU] | SUBCUTANEOUS | Status: DC
  Administered 2013-10-22 (×2): 4 [IU] via SUBCUTANEOUS
  Administered 2013-10-23 (×3): 2 [IU] via SUBCUTANEOUS
  Administered 2013-10-23: 1 [IU] via SUBCUTANEOUS
  Administered 2013-10-23: 2 [IU] via SUBCUTANEOUS

## 2013-10-22 MED ORDER — POTASSIUM CHLORIDE CRYS ER 20 MEQ PO TBCR
20.0000 meq | EXTENDED_RELEASE_TABLET | Freq: Once | ORAL | Status: AC
  Administered 2013-10-22: 20 meq via ORAL
  Filled 2013-10-22: qty 1

## 2013-10-22 MED ORDER — FUROSEMIDE 10 MG/ML IJ SOLN
20.0000 mg | Freq: Two times a day (BID) | INTRAMUSCULAR | Status: AC
  Administered 2013-10-22 (×2): 20 mg via INTRAVENOUS
  Filled 2013-10-22: qty 2

## 2013-10-22 MED ORDER — INSULIN ASPART 100 UNIT/ML ~~LOC~~ SOLN: 0.0000 [IU] | SUBCUTANEOUS | Status: DC

## 2013-10-22 NOTE — Progress Notes (Signed)
    Subjective:  Denies CP or dyspnea   Objective:  Filed Vitals:   10/22/13 0900 10/22/13 1000 10/22/13 1100 10/22/13 1217  BP: 120/79 108/51 110/62   Pulse: 138 102 113   Temp:    97.8 F (36.6 C)  TempSrc:    Oral  Resp: 23 18 30    Height:      Weight:      SpO2: 97% 100% 96%     Intake/Output from previous day:  Intake/Output Summary (Last 24 hours) at 10/22/13 1235 Last data filed at 10/22/13 1100  Gross per 24 hour  Intake 6795.25 ml  Output   1995 ml  Net 4800.25 ml    Physical Exam: Physical exam: Well-developed well-nourished in no acute distress.  Skin is warm and dry.  HEENT is normal.  Neck is supple.  Chest mildly diminished BS bases Cardiovascular exam tachycardic and irregular Abdominal exam nontender or distended. No masses palpated. Extremities show no edema. neuro grossly intact    Lab Results: Basic Metabolic Panel:  Recent Labs  10/21/13 0225  10/21/13 2000 10/21/13 2003 10/22/13 0420  NA 139  < >  --  137 136*  K 3.9  < >  --  4.0 4.2  CL 103  --   --  101 101  CO2 25  --   --   --  21  GLUCOSE 91  < >  --  106* 118*  BUN 14  --   --  8 10  CREATININE 0.88  --  0.67 0.50 0.71  CALCIUM 8.7  --   --   --  8.1*  MG  --   --  3.0*  --  2.6*  < > = values in this interval not displayed. CBC:  Recent Labs  10/21/13 2000 10/21/13 2003 10/22/13 0420  WBC 13.2*  --  12.0*  HGB 8.4* 8.2* 8.7*  HCT 24.2* 24.0* 25.4*  MCV 88.6  --  89.4  PLT 232  --  251    Assessment/Plan:  1 coronary artery disease status post coronary artery bypass graft-patient is doing well postoperatively. Continue aspirin. Add high-dose statin prior to discharge. 2 atrial fibrillation-the patient is in atrial fibrillation with an elevated ventricular rate. Continue IV amiodarone and metoprolol. Increase metoprolol later as blood pressure allows. Resume Coumadin when okay with surgery. Cardiovert 4-6 weeks after discharge once amiodarone fully loaded.  Hopefully he will maintain sinus rhythm. Note patient had an atrial appendage clip placed. 3 ischemic cardiomyopathy-hopefully LV function will improve following revascularization. Continue metoprolol. Add ACE inhibitor later if blood pressure allows.  Kirk Ruths 10/22/2013, 12:35 PM

## 2013-10-22 NOTE — Progress Notes (Signed)
Patient ID: [name], male   DOB: [birth year], 80 y.o.   MRN: [id number] EVENING ROUNDS NOTE :     [address]       [place] [address]             [phone]                 1 Day Post-Op Procedure(s) (LRB): CORONARY ARTERY BYPASS GRAFTING (CABG) (N/A) INTRAOPERATIVE TRANSESOPHAGEAL ECHOCARDIOGRAM (N/A) CLIPPING OF ATRIAL APPENDAGE  Total Length of Stay:  LOS: 5 days  BP 85/48  Pulse 106  Temp(Src) 97.8 F (36.6 C) (Oral)  Resp 21  Ht 5\' 8"  (1.727 m)  Wt 159 lb 6.3 oz (72.3 kg)  BMI 24.24 kg/m2  SpO2 96%  .Intake/Output     01/08 0701 - 01/09 0700   P.O. 660   I.V. (mL/kg) 494.6 (6.8)   Blood    IV Piggyback 50   Total Intake(mL/kg) 1204.6 (16.7)   Urine (mL/kg/hr) 475 (0.5)   Chest Tube 70 (0.1)   Total Output 545   Net +659.6         . sodium chloride Stopped (10/22/13 0900)  . sodium chloride 20 mL/hr at 10/21/13 1500  . sodium chloride    . amiodarone (NEXTERONE PREMIX) 360 mg/200 mL dextrose 30 mg/hr (10/22/13 1900)  . dexmedetomidine Stopped (10/21/13 1930)  . DOPamine 1 mcg/kg/min (10/22/13 1900)  . insulin (NOVOLIN-R) infusion Stopped (10/22/13 0200)  . lactated ringers 20 mL/hr (10/22/13 1115)  . milrinone Stopped (10/21/13 1430)  . nitroGLYCERIN Stopped (10/21/13 1430)  . phenylephrine (NEO-SYNEPHRINE) Adult infusion Stopped (10/22/13 0630)     Lab Results  Component Value Date   WBC 14.0* 10/22/2013   HGB 8.7* 10/22/2013   HCT 25.4* 10/22/2013   PLT 236 10/22/2013   GLUCOSE 183* 10/22/2013   ALT 11 10/18/2013   AST 18 10/18/2013   NA 132* 10/22/2013   K 4.3 10/22/2013   CL 97 10/22/2013   CREATININE 1.22 10/22/2013   BUN 15 10/22/2013   CO2 21 10/22/2013   TSH 1.000 10/22/2013   INR 1.84* 10/21/2013   HGBA1C 6.0* 10/20/2013   Stable day Afib, rate better controlled  Grace Isaac MD  Beeper [phone] Office [phone] 10/22/2013 7:22 PM

## 2013-10-22 NOTE — Progress Notes (Addendum)
TCTS DAILY ICU PROGRESS NOTE                   [name]            [name] [address]          [phone]   1 Day Post-Op Procedure(s) (LRB): CORONARY ARTERY BYPASS GRAFTING (CABG) (N/A) INTRAOPERATIVE TRANSESOPHAGEAL ECHOCARDIOGRAM (N/A) CLIPPING OF ATRIAL APPENDAGE  Total Length of Stay:  LOS: 5 days   Subjective: Patient with no specific complaints except being thirsty.  Objective: Vital signs in last 24 hours: Temp:  [94.5 F (34.7 C)-99.9 F (37.7 C)] 99.3 F (37.4 C) (01/08 0700) Pulse Rate:  [54-130] 128 (01/08 0700) Cardiac Rhythm:  [-] Atrial fibrillation (01/08 0400) Resp:  [12-24] 20 (01/08 0700) BP: (78-128)/(49-86) 101/54 mmHg (01/08 0700) SpO2:  [89 %-100 %] 96 % (01/08 0700) Arterial Line BP: (74-134)/(39-72) 111/55 mmHg (01/08 0700) FiO2 (%):  [40 %-60 %] 40 % (01/07 2114) Weight:  [66 kg (145 lb 8.1 oz)-72.3 kg (159 lb 6.3 oz)] 72.3 kg (159 lb 6.3 oz) (01/08 0500)  Filed Weights   10/21/13 0500 10/21/13 1500 10/22/13 0500  Weight: 66 kg (145 lb 8.1 oz) 66 kg (145 lb 8.1 oz) 72.3 kg (159 lb 6.3 oz)    Weight change: 0 kg (0 lb)   Hemodynamic parameters for last 24 hours: PAP: (28-47)/(18-30) 33/20 mmHg CO:  [2.7 L/min-3.9 L/min] 3.9 L/min CI:  [1.5 L/min/m2-2.2 L/min/m2] 2.2 L/min/m2  Intake/Output from previous day: 01/07 0701 - 01/08 0700 In: 6296.7 [I.V.:4676.7; Blood:500; IV Piggyback:1120] Out: 2555 [Urine:2115; Chest Tube:440]  Intake/Output this shift:    Current Meds: Scheduled Meds: . acetaminophen  1,000 mg Oral Q6H   Or  . acetaminophen (TYLENOL) oral liquid 160 mg/5 mL  1,000 mg Per Tube Q6H  . aspirin EC  325 mg Oral Daily   Or  . aspirin  324 mg Per Tube Daily  . bisacodyl  10 mg Oral Daily   Or  . bisacodyl  10 mg Rectal Daily  . cefUROXime (ZINACEF)  IV  1.5 g Intravenous Q12H  . docusate sodium  200 mg Oral Daily  . insulin aspart  0-24 Units Subcutaneous Q4H  . insulin regular  0-10 Units  Intravenous TID WC  . metoCLOPramide (REGLAN) injection  10 mg Intravenous Q6H  . metoprolol tartrate  12.5 mg Oral BID   Or  . metoprolol tartrate  12.5 mg Per Tube BID  . [START ON 10/23/2013] pantoprazole  40 mg Oral Daily  . sodium chloride  3 mL Intravenous Q12H   Continuous Infusions: . sodium chloride 20 mL/hr at 10/22/13 0700  . sodium chloride 20 mL/hr at 10/21/13 1500  . sodium chloride    . amiodarone (NEXTERONE PREMIX) 360 mg/200 mL dextrose 30 mg/hr (10/22/13 0315)  . dexmedetomidine Stopped (10/21/13 1930)  . DOPamine 3 mcg/kg/min (10/22/13 0400)  . insulin (NOVOLIN-R) infusion Stopped (10/22/13 0200)  . lactated ringers 20 mL/hr at 10/22/13 0000  . milrinone Stopped (10/21/13 1430)  . nitroGLYCERIN Stopped (10/21/13 1430)  . phenylephrine (NEO-SYNEPHRINE) Adult infusion Stopped (10/22/13 0630)   PRN Meds:.albumin human, metoprolol, midazolam, morphine injection, ondansetron (ZOFRAN) IV, oxyCODONE, sodium chloride  General appearance: alert, cooperative and no distress Neurologic: intact Heart: irregularly irregular rhythm Lungs: clear to auscultation bilaterally Abdomen: soft, non-tender; bowel sounds normal; no masses,  no organomegaly Extremities: LE with mild edema Wound: LE dressings are dry and intact;Sternal dressing is intact  Lab Results: CBC:  Recent Labs  10/21/13 2000 10/21/13 2003 10/22/13 0420  WBC 13.2*  --  12.0*  HGB 8.4* 8.2* 8.7*  HCT 24.2* 24.0* 25.4*  PLT 232  --  251   BMET:  Recent Labs  10/21/13 0225  10/21/13 2003 10/22/13 0420  NA 139  < > 137 136*  K 3.9  < > 4.0 4.2  CL 103  --  101 101  CO2 25  --   --  21  GLUCOSE 91  < > 106* 118*  BUN 14  --  8 10  CREATININE 0.88  < > 0.50 0.71  CALCIUM 8.7  --   --  8.1*  < > = values in this interval not displayed.  PT/INR:  Recent Labs  10/21/13 1447  LABPROT 20.7*  INR 1.84*   Radiology: Dg Chest Portable 1 View  10/21/2013   CLINICAL DATA:  Status post CABG.  EXAM:  PORTABLE CHEST - 1 VIEW  COMPARISON:  PA and lateral chest 10/17/2013.  FINDINGS: The patient has undergone CABG and clipping of the atrial appendage since the comparison examination. ET tube is in place with tip in good position at the level of the clavicular heads. Tip of a right IJ approach Swan-Ganz catheter is in the distal most pulmonary outflow tract. Left chest tube and NG tube are noted. There is no pneumothorax. Calcified pleural plaques are noted, unchanged. No consolidative process, pulmonary edema or effusion.  IMPRESSION: Support apparatus in good position.  No pneumothorax.  No acute disease.  Calcified pleural plaques.   Electronically Signed   By: [name] M.D.   On: 10/21/2013 15:09     Assessment/Plan: S/P Procedure(s) (LRB): CORONARY ARTERY BYPASS GRAFTING (CABG) (N/A) INTRAOPERATIVE TRANSESOPHAGEAL ECHOCARDIOGRAM (N/A) CLIPPING OF ATRIAL APPENDAGE  1.CV-History of a fib. S/p clipping of LA appendage. EKG showed a fib with RVR and incomplete RBBB (no change from pre op EKG).On Amiodarone and Dopamine drips. Wean Dopamine as tolerates. Also, on Lopressor 12.5 bid. Will restart in Coumadin in 1-2 days. 2.Pulmonary- Chest tubes with 440 cc of output since surgery. Likely remove mediastinal and leave pleural for now.CXR this am appears to show no pneumothorax, small bilateral pleural effusions, cardiomegaly, and bibasilar atelectasis R>L. Encourage incentive spirometer. 3.ABL anemia- H and H stable at 8.7 and 25.4 4.Volume overload-begin gentle diuresis 5.CBGs 97/97/120 . Will start low dose Levemir to keep off insulin drip.Pre op HGA1C 6. He is pre diabetic and will need further surveillance as an outpatient. 6.Please progression orders  [name] M PA-C 10/22/2013 7:56 AM  Remove mt Wean dopamine Wait another day to start coumadin On iv cordarone I have seen and examined [name] and agree with the above assessment  and plan.  Brent Isaac  MD Beeper [phone] Office [phone] 10/22/2013 8:26 AM

## 2013-10-22 NOTE — Op Note (Signed)
NAMEJASSON, [name] NO.:  [id number]  MEDICAL RECORD NO.:  [id number]  LOCATION:  2S07C                        FACILITY:  [facility]  PHYSICIAN:  Lanelle Bal, MD    DATE OF BIRTH:  May 17, 1918  DATE OF PROCEDURE:  10/21/2013 DATE OF DISCHARGE:                              OPERATIVE REPORT   PREOPERATIVE DIAGNOSES:  Recent non ST-elevation myocardial infarction with critical left main obstruction and left ventricular dysfunction. Ejection fraction approximately 30%.  Atrial fibrillation.  POSTOPERATIVE DIAGNOSIS:  Recent non ST-elevation myocardial infarction with critical left main obstruction and left ventricular dysfunction. Ejection fraction approximately 30% with incidental enlarged mammary chain lymph node.  PROCEDURE PERFORMED:  Coronary artery bypass grafting x4 with the left internal mammary to the left anterior descending coronary artery, sequential reverse saphenous vein graft to the obtuse marginal and circumflex coronary artery, reverse saphenous vein graft to the distal right coronary artery and placement of atrial clip, incidental biopsy, mammary chain lymph node, and bilateral thigh endo vein harvesting.  SURGEON:  Lanelle Bal, MD  FIRST ASSISTANT:  Providence Crosby.  BRIEF HISTORY:  The patient is a 80 year old male with known history of asbestosis, who presented with new onset of shortness of breath and vague chest discomfort.  He was well admitted with mild elevation of his troponin.  He has been on chronic Coumadin therapy for history of atrial fibrillation.  After several days allowed washout of Coumadin, he underwent cardiac catheterization by Dr. Wynonia Lawman, which demonstrated high- grade proximal LAD and distal left main obstruction in addition to his subtotal occlusion of the right coronary artery, an ejection fraction of 30%, and moderate mitral regurgitation.  In spite of the patient's age, he continues to care for himself and  exercises daily until the last several weeks.  Because of his critical anatomy, coronary artery bypass grafting was recommended to him.  Risks and options were discussed including the risk of stroke.  He was agreeable and signed informed consent.  DESCRIPTION OF PROCEDURE:  With Swan-Ganz and arterial line monitors in place, the patient underwent general endotracheal anesthesia without incident.  The skin of the chest and legs were prepped with Betadine and draped in usual sterile manner.  Dr. Chriss Driver performed a TEE which did demonstrate 2+ mitral regurgitation, but without evidence of flail leaflets or chordee.  Using the [employer] endo vein harvesting system vein was harvested from the right thigh.  Below the knee, it became small and not usable.  So, additional vein was harvested from the left thigh endoscopically.  A median sternotomy was performed.  Left internal mammary artery was dissected down as pedicle graft.  The distal artery was divided, had good free flow.  The pericardium was opened in the patient's pleural space with significant calcifications noted on chest x- ray consistent with asbestos exposure in the past.  The patient was systemically heparinized.  Ascending aorta was cannulated.  The right atrium was cannulated and aortic root vent cardioplegia needle was introduced into the ascending aorta.  The patient was placed on cardiopulmonary bypass 2.4 L/minute/meter squared.  Aortic root vent cardioplegia needle was introduced into the ascending aorta.  The patient's blood by temperature was  cooled to 28 degrees.  Aortic crossclamp was applied, 500 mL of cold blood potassium cardioplegia was administered with diastolic arrest.  Myocardial septal temperature was monitored throughout the crossclamp period.  Attention was turned first to the distal right coronary artery.  The patient had a bifurcating distal system with a larger posterior descending branch and a smaller distal  branch.  The larger of the 2 branches was opened and admitted to 1.5 mm probe distally and proximally.  Using a running 7-0 Prolene, distal anastomosis was performed with a segment of reverse saphenous vein graft.  The heart was then elevated and the obtuse marginal coronary artery was opened and admitted a 1.5 mm probe.  Using a running 7-0 Prolene, a side-to-side anastomosis with segment reverse saphenous vein graft was carried out.  Distal extent of the same vein was carried to the distal circumflex branch which was a smaller vessel, but admitted a 1 mm probe distally.  Using a running 7-0 Prolene, distal anastomosis was performed.  Attention was then turned to the left anterior descending coronary artery.  This took extended amount of time as the vessel was deeply intramyocardial, and after took careful dissection to locate the mid-to-distal segment of the LAD.  When dissected out of the septum, it was of reasonable size and admitted a 1.5 mm probe proximally and distally.  Using a running 8-0 Prolene left internal mammary artery was anastomosed to the left anterior descending coronary artery.  With release of the bulldog on the mammary artery, it was appropriate rise in myocardial septal temperature.  Bulldog was placed back on the mammary artery with crossclamp still in place, 2 punch aortotomies were performed in each of the two vein grafts anastomosed to the ascending aorta.  Air was evacuated from the ascending aorta and grafts, and aortic cross-clamp was removed with total crossclamp time of 91 minutes. The patient required electrical defibrillation and return to a sinus rhythm.  He transiently required atrial pacing to increase his rate.  He was started on milrinone and dopamine infusion, and also started on amiodarone which he had not been on previously.  With the body temperature rewarmed to 37 degrees he was then ventilated and weaned from cardiopulmonary bypass without  difficulty.  TEE showed decrease in the degree of mitral regurgitation and some overall improvement in his ventricular function.  He remained hemodynamically stable and was decannulated in usual fashion.  Protamine sulfate was administered with operative field hemostatic.  A left pleural tube and a Blake mediastinal drain were left in place.  Pericardium was loosely reapproximated. Graft markers were placed on each of the 2 proximal anastomosis.  The sternum was then closed with #6 stainless steel wire.  Fascia was closed with interrupted 0 Vicryl, running 3-0 Vicryl in subcutaneous tissue, 3- 0 subcuticular stitch in skin edges.  Dry dressings were applied. Sponge and needle count was reported as correct at completion of the procedure.  The patient tolerated the procedure without obvious complication.  Total pump time was 133 minutes.  He did not require any blood bank blood products during the operative procedure.  Because of the patient's history of atrial fibrillation, an AtriCure AtriClip 40 mm was sized for the left atrial appendage, and the left part of the heart was arrested and the left atrial appendage clip was placed.     Lanelle Bal, MD     EG/MEDQ  D:  10/22/2013  T:  10/22/2013  Job:  [id number]

## 2013-10-23 ENCOUNTER — Inpatient Hospital Stay (HOSPITAL_COMMUNITY): Payer: Medicare Other

## 2013-10-23 LAB — CBC
HCT: 24.3 % — ABNORMAL LOW (ref 39.0–52.0)
HCT: 24.3 % — ABNORMAL LOW (ref 39.0–52.0)
HEMOGLOBIN: 8.6 g/dL — AB (ref 13.0–17.0)
Hemoglobin: 8.4 g/dL — ABNORMAL LOW (ref 13.0–17.0)
MCH: 31.4 pg (ref 26.0–34.0)
MCH: 30.2 pg (ref 26.0–34.0)
MCHC: 35.4 g/dL (ref 30.0–36.0)
MCHC: 34.6 g/dL (ref 30.0–36.0)
MCV: 88.7 fL (ref 78.0–100.0)
MCV: 87.4 fL (ref 78.0–100.0)
Platelets: 222 10*3/uL (ref 150–400)
Platelets: 232 10*3/uL (ref 150–400)
RBC: 2.74 MIL/uL — ABNORMAL LOW (ref 4.22–5.81)
RBC: 2.78 MIL/uL — ABNORMAL LOW (ref 4.22–5.81)
RDW: 15.6 % — ABNORMAL HIGH (ref 11.5–15.5)
RDW: 14.9 % (ref 11.5–15.5)
WBC: 14.7 10*3/uL — ABNORMAL HIGH (ref 4.0–10.5)
WBC: 17.3 10*3/uL — ABNORMAL HIGH (ref 4.0–10.5)

## 2013-10-23 LAB — BASIC METABOLIC PANEL
BUN: 27 mg/dL — AB (ref 6–23)
BUN: 27 mg/dL — ABNORMAL HIGH (ref 6–23)
CO2: 23 meq/L (ref 19–32)
CO2: 24 mEq/L (ref 19–32)
Calcium: 8.3 mg/dL — ABNORMAL LOW (ref 8.4–10.5)
Calcium: 8.2 mg/dL — ABNORMAL LOW (ref 8.4–10.5)
Chloride: 97 mEq/L (ref 96–112)
Chloride: 94 mEq/L — ABNORMAL LOW (ref 96–112)
Creatinine, Ser: 1.77 mg/dL — ABNORMAL HIGH (ref 0.50–1.35)
Creatinine, Ser: 1.18 mg/dL (ref 0.50–1.35)
GFR calc Af Amer: 36 mL/min — ABNORMAL LOW (ref >90)
GFR calc Af Amer: 59 mL/min — ABNORMAL LOW (ref >90)
GFR calc non Af Amer: 51 mL/min — ABNORMAL LOW (ref >90)
GFR, EST NON AFRICAN AMERICAN: 31 mL/min — AB (ref >90)
GLUCOSE: 143 mg/dL — AB (ref 70–99)
Glucose, Bld: 118 mg/dL — ABNORMAL HIGH (ref 70–99)
POTASSIUM: 5 meq/L (ref 3.7–5.3)
Potassium: 4.2 mEq/L (ref 3.7–5.3)
SODIUM: 133 meq/L — AB (ref 137–147)
Sodium: 129 mEq/L — ABNORMAL LOW (ref 137–147)

## 2013-10-23 LAB — GLUCOSE, CAPILLARY
GLUCOSE-CAPILLARY: 162 mg/dL — AB (ref 70–99)
GLUCOSE-CAPILLARY: 135 mg/dL — AB (ref 70–99)
GLUCOSE-CAPILLARY: 121 mg/dL — AB (ref 70–99)
GLUCOSE-CAPILLARY: 125 mg/dL — AB (ref 70–99)
GLUCOSE-CAPILLARY: 137 mg/dL — AB (ref 70–99)
Glucose-Capillary: 110 mg/dL — ABNORMAL HIGH (ref 70–99)
Glucose-Capillary: 142 mg/dL — ABNORMAL HIGH (ref 70–99)

## 2013-10-23 MED ORDER — METOCLOPRAMIDE HCL 5 MG/ML IJ SOLN
10.0000 mg | Freq: Four times a day (QID) | INTRAMUSCULAR | Status: AC
  Administered 2013-10-23 (×4): 10 mg via INTRAVENOUS
  Filled 2013-10-23 (×4): qty 2

## 2013-10-23 MED ORDER — DOPAMINE-DEXTROSE 3.2-5 MG/ML-% IV SOLN: 2.5000 ug/kg/min | INTRAVENOUS | Status: DC

## 2013-10-23 MED ORDER — TRAMADOL HCL 50 MG PO TABS: 50.0000 mg | ORAL_TABLET | Freq: Four times a day (QID) | ORAL | Status: DC | PRN

## 2013-10-23 MED ORDER — ENOXAPARIN SODIUM 30 MG/0.3ML ~~LOC~~ SOLN
30.0000 mg | SUBCUTANEOUS | Status: DC
  Administered 2013-10-23 – 2013-10-28 (×6): 30 mg via SUBCUTANEOUS
  Filled 2013-10-23 (×9): qty 0.3

## 2013-10-23 MED ORDER — AMIODARONE LOAD VIA INFUSION
150.0000 mg | Freq: Once | INTRAVENOUS | Status: AC
  Administered 2013-10-23: 150 mg via INTRAVENOUS
  Filled 2013-10-23: qty 83.34

## 2013-10-23 NOTE — Progress Notes (Addendum)
TCTS DAILY ICU PROGRESS NOTE                   [address]            [place] [address]          [phone]   2 Days Post-Op Procedure(s) (LRB): CORONARY ARTERY BYPASS GRAFTING (CABG) (N/A) INTRAOPERATIVE TRANSESOPHAGEAL ECHOCARDIOGRAM (N/A) CLIPPING OF ATRIAL APPENDAGE  Total Length of Stay:  LOS: 6 days   Subjective: Patient with nausea this am.  Objective: Vital signs in last 24 hours: Temp:  [97.8 F (36.6 C)-99.3 F (37.4 C)] 98 F (36.7 C) (01/09 0400) Pulse Rate:  [101-139] 105 (01/09 0700) Cardiac Rhythm:  [-] Atrial fibrillation (01/09 0400) Resp:  [16-30] 18 (01/09 0700) BP: (80-128)/(42-84) 107/57 mmHg (01/09 0700) SpO2:  [92 %-100 %] 95 % (01/09 0700) Arterial Line BP: (106-112)/(52-64) 106/56 mmHg (01/08 1100) Weight:  [72.9 kg (160 lb 11.5 oz)] 72.9 kg (160 lb 11.5 oz) (01/09 0500)  Filed Weights   10/21/13 1500 10/22/13 0500 10/23/13 0500  Weight: 66 kg (145 lb 8.1 oz) 72.3 kg (159 lb 6.3 oz) 72.9 kg (160 lb 11.5 oz)    Weight change: 6.9 kg (15 lb 3.4 oz)   Hemodynamic parameters for last 24 hours: PAP: (32)/(20) 32/20 mmHg CO:  [3.9 L/min] 3.9 L/min CI:  [2.2 L/min/m2] 2.2 L/min/m2  Intake/Output from previous day: 01/08 0701 - 01/09 0700 In: 1757.6 [P.O.:710; I.V.:947.6; IV Piggyback:100] Out: 775 [Urine:705; Chest Tube:70]  Intake/Output this shift:    Current Meds: Scheduled Meds: . acetaminophen  1,000 mg Oral Q6H   Or  . acetaminophen (TYLENOL) oral liquid 160 mg/5 mL  1,000 mg Per Tube Q6H  . aspirin EC  325 mg Oral Daily   Or  . aspirin  324 mg Per Tube Daily  . bisacodyl  10 mg Oral Daily   Or  . bisacodyl  10 mg Rectal Daily  . docusate sodium  200 mg Oral Daily  . insulin aspart  0-24 Units Subcutaneous Q4H  . insulin detemir  4 Units Subcutaneous Daily  . insulin regular  0-10 Units Intravenous TID WC  . metoprolol tartrate  12.5 mg Oral BID   Or  . metoprolol tartrate  12.5 mg Per Tube BID  .  pantoprazole  40 mg Oral Daily  . sodium chloride  3 mL Intravenous Q12H   Continuous Infusions: . sodium chloride Stopped (10/22/13 0900)  . sodium chloride 20 mL/hr at 10/21/13 1500  . sodium chloride    . amiodarone (NEXTERONE PREMIX) 360 mg/200 mL dextrose 30 mg/hr (10/23/13 0700)  . dexmedetomidine Stopped (10/21/13 1930)  . DOPamine 1 mcg/kg/min (10/23/13 0700)  . insulin (NOVOLIN-R) infusion Stopped (10/22/13 0200)  . lactated ringers 20 mL/hr (10/22/13 1115)  . milrinone Stopped (10/21/13 1430)  . nitroGLYCERIN Stopped (10/21/13 1430)  . phenylephrine (NEO-SYNEPHRINE) Adult infusion Stopped (10/22/13 0630)   PRN Meds:.metoprolol, midazolam, morphine injection, ondansetron (ZOFRAN) IV, oxyCODONE, sodium chloride  General appearance: alert, cooperative and no distress Neurologic: intact Heart: irregularly irregular rhythm Lungs: clear to auscultation bilaterally Abdomen: soft, non-tender; distended;rare bowel sounds Extremities: Bilateral LE with mild edema Wound: LE dressings are dry and intact;Sternal dressing is intact  Lab Results: CBC:  Recent Labs  10/22/13 1600 10/23/13 0445  WBC 14.0* 14.7*  HGB 8.7* 8.6*  HCT 25.4* 24.3*  PLT 236 222   BMET:   Recent Labs  10/22/13 0420 10/22/13 1559 10/22/13 1600 10/23/13 0445  NA  136* 132*  --  133*  K 4.2 4.3  --  5.0  CL 101 97  --  97  CO2 21  --   --  23  GLUCOSE 118* 183*  --  143*  BUN 10 15  --  27*  CREATININE 0.71 1.30 1.22 1.77*  CALCIUM 8.1*  --   --  8.3*    PT/INR:   Recent Labs  10/21/13 1447  LABPROT 20.7*  INR 1.84*   Radiology: Dg Chest Portable 1 View In Am  10/22/2013   CLINICAL DATA:  Status post CABG. Atrial appendage clipping. Hypertension.  EXAM: PORTABLE CHEST - 1 VIEW  COMPARISON:  DG CHEST 1V PORT dated 10/21/2013; DG CHEST 2V dated 10/17/2013; DG CHEST 2V dated 07/10/2013  FINDINGS: Right IJ Swan-Ganz catheter unchanged with tip at pulmonary outflow tract. Mediastinal drain.  Left-sided chest tube. Extubation, and removal of nasogastric tube.  Left atrial appendage occlusion device. Midline trachea. Mild cardiomegaly with atherosclerosis in the transverse aorta. No pleural effusion or pneumothorax. Bilateral pleural plaques again identified. No congestive failure. Slight increase in left base airspace disease.  IMPRESSION: Extubation and removal of nasogastric tube. Developing left base Airspace disease, likely atelectasis.  Otherwise, expected appearance after median sternotomy without pneumothorax.  Findings of asbestos related pleural disease, as before.   Electronically Signed   By: Abigail Miyamoto M.D.   On: 10/22/2013 07:57   Dg Chest Portable 1 View  10/21/2013   CLINICAL DATA:  Status post CABG.  EXAM: PORTABLE CHEST - 1 VIEW  COMPARISON:  PA and lateral chest 10/17/2013.  FINDINGS: The patient has undergone CABG and clipping of the atrial appendage since the comparison examination. ET tube is in place with tip in good position at the level of the clavicular heads. Tip of a right IJ approach Swan-Ganz catheter is in the distal most pulmonary outflow tract. Left chest tube and NG tube are noted. There is no pneumothorax. Calcified pleural plaques are noted, unchanged. No consolidative process, pulmonary edema or effusion.  IMPRESSION: Support apparatus in good position.  No pneumothorax.  No acute disease.  Calcified pleural plaques.   Electronically Signed   By: Inge Rise M.D.   On: 10/21/2013 15:09     Assessment/Plan: S/P Procedure(s) (LRB): CORONARY ARTERY BYPASS GRAFTING (CABG) (N/A) INTRAOPERATIVE TRANSESOPHAGEAL ECHOCARDIOGRAM (N/A) CLIPPING OF ATRIAL APPENDAGE  1.CV-History of a fib. S/p clipping of LA appendage. A fib with HR in the 110's.On Amiodarone and Dopamine drips. Off Dopamine this am. Also, on Lopressor 12.5 bid. Will likely restart Coumadin today. 2.Pulmonary- Chest tubes with 70 cc of output since surgery. Likely remove mediastinal and leave  pleural for now.CXR this am appears to show no pneumothorax, small bilateral pleural effusions, cardiomegaly, and bibasilar atelectasis R>L. Encourage incentive spirometer. 3.ABL anemia- H and H stable at 8.6 and 24.3 4.Volume overload-will not give Lasix today as Cr increased 5.CBGs 163/142/135 . Continue Levemir to keep off insulin drip.Pre op HGA1C 6. He is pre diabetic and will need further surveillance as an outpatient. 6.GI-likely has ileus. Will decrease diet (may need to make NPO) and give Reglan for a few doses.Stop Oxy and give Ultram.  7.Creatinine up from 1.22 to 1.77. Avoid nephrotoxic agents. 8. Foley to remain for UO while in ICU  ZIMMERMAN,DONIELLE M PA-C 10/23/2013 7:39 AM  Keep foley to monitor urine output Resume dopamine Acute Kidney Injury (any one)  Increase in SCr by > 0.3 within 48 hours  Increase SCr to > 1.5 times  baseline  Urine volume < 0.5 ml/kg/h for 6 hrs  Stage:  Risk:   1.5x increase in creatinine or GFR decrease by 25% or UOP <0.38ml/kgperhr for 6 hrs   Lab Results  Component Value Date   CREATININE 1.77* 10/23/2013   CREATININE: 1.77 mg/dL ABNORMAL (10/23/13 0445) Estimated creatinine clearance - Cockcroft-Gault CrCl: 25.2 mL/min  I have seen and examined Nadean Corwin Deshler and agree with the above assessment  and plan.  Grace Isaac MD Beeper [phone] Office [phone] 10/23/2013 8:03 AM

## 2013-10-23 NOTE — Progress Notes (Signed)
    Subjective:  Denies CP or dyspnea; complains of nausea   Objective:  Filed Vitals:   10/23/13 0700 10/23/13 0749 10/23/13 0800 10/23/13 0900  BP: 107/57  108/64 109/73  Pulse: 105  109 107  Temp:  97.7 F (36.5 C)    TempSrc:  Oral    Resp: 18  22 18   Height:      Weight:      SpO2: 95%  98% 91%    Intake/Output from previous day:  Intake/Output Summary (Last 24 hours) at 10/23/13 0930 Last data filed at 10/23/13 0900  Gross per 24 hour  Intake 1414.75 ml  Output    725 ml  Net 689.75 ml    Physical Exam: Physical exam: Well-developed well-nourished in no acute distress.  Skin is warm and dry.  HEENT is normal.  Neck is supple.  Chest diminished BS bases Cardiovascular exam tachycardic and irregular Abdominal exam nontender or distended. No masses palpated. Extremities show trace edema. neuro grossly intact    Lab Results: Basic Metabolic Panel:  Recent Labs  10/22/13 0420 10/22/13 1559 10/22/13 1600 10/23/13 0445  NA 136* 132*  --  133*  K 4.2 4.3  --  5.0  CL 101 97  --  97  CO2 21  --   --  23  GLUCOSE 118* 183*  --  143*  BUN 10 15  --  27*  CREATININE 0.71 1.30 1.22 1.77*  CALCIUM 8.1*  --   --  8.3*  MG 2.6*  --  2.4  --    CBC:  Recent Labs  10/22/13 1600 10/23/13 0445  WBC 14.0* 14.7*  HGB 8.7* 8.6*  HCT 25.4* 24.3*  MCV 90.1 88.7  PLT 236 222    Assessment/Plan:  1 coronary artery disease status post coronary artery bypass graft-Continue aspirin. Add high-dose statin prior to discharge. 2 atrial fibrillation-the patient remains in atrial fibrillation with an elevated ventricular rate. Continue IV amiodarone and metoprolol. Increase metoprolol later as blood pressure allows. Resume Coumadin when okay with surgery. Cardiovert 4-6 weeks after discharge once amiodarone fully loaded. Hopefully he will maintain sinus rhythm. Note patient had an atrial appendage clip placed. 3 ischemic cardiomyopathy-hopefully LV function will  improve following revascularization. Continue metoprolol. Add ACE inhibitor later if blood pressure allows. 4 Postoperative volume excess-continue diuresis 5 Acute kidney injury-CR mildly increased postoperatively; follow  Kirk Ruths 10/23/2013, 9:30 AM

## 2013-10-23 NOTE — Progress Notes (Signed)
POD # 2 CABG, left atrial clip  Resting comfortably  BP 137/81  Pulse 120  Temp(Src) 97.5 F (36.4 C) (Oral)  Resp 19  Ht 5\' 8"  (1.727 m)  Wt 160 lb 11.5 oz (72.9 kg)  BMI 24.44 kg/m2  SpO2 96%   Intake/Output Summary (Last 24 hours) at 10/23/13 1706 Last data filed at 10/23/13 1700  Gross per 24 hour  Intake 1550.2 ml  Output    630 ml  Net  920.2 ml   CBG well controlled  Continue present care

## 2013-10-24 ENCOUNTER — Inpatient Hospital Stay (HOSPITAL_COMMUNITY): Payer: Medicare Other

## 2013-10-24 DIAGNOSIS — I10 Essential (primary) hypertension: Secondary | ICD-10-CM

## 2013-10-24 LAB — TYPE AND SCREEN
ABO/RH(D): O POS
Antibody Screen: NEGATIVE
Unit division: 0
Unit division: 0
Unit division: 0
Unit division: 0

## 2013-10-24 LAB — GLUCOSE, CAPILLARY
GLUCOSE-CAPILLARY: 114 mg/dL — AB (ref 70–99)
GLUCOSE-CAPILLARY: 101 mg/dL — AB (ref 70–99)
Glucose-Capillary: 106 mg/dL — ABNORMAL HIGH (ref 70–99)
Glucose-Capillary: 111 mg/dL — ABNORMAL HIGH (ref 70–99)
Glucose-Capillary: 112 mg/dL — ABNORMAL HIGH (ref 70–99)
Glucose-Capillary: 144 mg/dL — ABNORMAL HIGH (ref 70–99)

## 2013-10-24 LAB — CBC
HCT: 23 % — ABNORMAL LOW (ref 39.0–52.0)
Hemoglobin: 8.1 g/dL — ABNORMAL LOW (ref 13.0–17.0)
MCH: 31.2 pg (ref 26.0–34.0)
MCHC: 35.2 g/dL (ref 30.0–36.0)
MCV: 88.5 fL (ref 78.0–100.0)
Platelets: 217 10*3/uL (ref 150–400)
RBC: 2.6 MIL/uL — ABNORMAL LOW (ref 4.22–5.81)
RDW: 15.3 % (ref 11.5–15.5)
WBC: 15.6 10*3/uL — ABNORMAL HIGH (ref 4.0–10.5)

## 2013-10-24 LAB — BASIC METABOLIC PANEL
BUN: 32 mg/dL — ABNORMAL HIGH (ref 6–23)
CALCIUM: 8.3 mg/dL — AB (ref 8.4–10.5)
CO2: 24 mEq/L (ref 19–32)
Chloride: 97 mEq/L (ref 96–112)
Creatinine, Ser: 1.29 mg/dL (ref 0.50–1.35)
GFR calc Af Amer: 53 mL/min — ABNORMAL LOW (ref >90)
GFR, EST NON AFRICAN AMERICAN: 46 mL/min — AB (ref >90)
Glucose, Bld: 108 mg/dL — ABNORMAL HIGH (ref 70–99)
Potassium: 4.5 mEq/L (ref 3.7–5.3)
SODIUM: 131 meq/L — AB (ref 137–147)

## 2013-10-24 MED ORDER — DILTIAZEM HCL ER COATED BEADS 120 MG PO CP24: 120.0000 mg | ORAL_CAPSULE | Freq: Every day | ORAL | Status: DC

## 2013-10-24 MED ORDER — INSULIN ASPART 100 UNIT/ML ~~LOC~~ SOLN
0.0000 [IU] | Freq: Three times a day (TID) | SUBCUTANEOUS | Status: DC
  Administered 2013-10-24 – 2013-10-25 (×2): 2 [IU] via SUBCUTANEOUS

## 2013-10-24 MED ORDER — WARFARIN SODIUM 2.5 MG PO TABS
2.5000 mg | ORAL_TABLET | Freq: Once | ORAL | Status: AC
  Administered 2013-10-24: 2.5 mg via ORAL
  Filled 2013-10-24: qty 1

## 2013-10-24 MED ORDER — WARFARIN - PHYSICIAN DOSING INPATIENT: Freq: Every day | Status: DC

## 2013-10-24 MED ORDER — DILTIAZEM HCL ER COATED BEADS 120 MG PO CP24
120.0000 mg | ORAL_CAPSULE | Freq: Every day | ORAL | Status: DC
  Administered 2013-10-24 – 2013-10-28 (×5): 120 mg via ORAL
  Filled 2013-10-24 (×6): qty 1

## 2013-10-24 NOTE — Progress Notes (Signed)
SUBJECTIVE: Nausea improving, still not eating well. He feels that he is improving.  No shortness of breath or dizziness.   Was admitted 1-3 with afib with RVR and new LV dysfunction.  Catheterization demonstrated multivessel CAD with subsequent CABG 1-7 along with atrial appendage clipping. Currently on IV Amiodarone and Diltiazem for rate control.  Warfarin to be resumed today.   CURRENT MEDICATIONS: . acetaminophen  1,000 mg Oral Q6H   Or  . acetaminophen (TYLENOL) oral liquid 160 mg/5 mL  1,000 mg Per Tube Q6H  . aspirin EC  325 mg Oral Daily   Or  . aspirin  324 mg Per Tube Daily  . bisacodyl  10 mg Oral Daily   Or  . bisacodyl  10 mg Rectal Daily  . diltiazem  120 mg Oral Daily  . docusate sodium  200 mg Oral Daily  . enoxaparin (LOVENOX) injection  30 mg Subcutaneous Q24H  . insulin aspart  0-9 Units Subcutaneous TID WC  . insulin detemir  4 Units Subcutaneous Daily  . pantoprazole  40 mg Oral Daily  . sodium chloride  3 mL Intravenous Q12H  . warfarin  2.5 mg Oral ONCE-1800  . Warfarin - Physician Dosing Inpatient   Does not apply q1800   . amiodarone (NEXTERONE PREMIX) 360 mg/200 mL dextrose 30 mg/hr (10/24/13 0815)    OBJECTIVE: Physical Exam: Filed Vitals:   10/24/13 0747 10/24/13 0800 10/24/13 1114 10/24/13 1151  BP:  97/62 119/71   Pulse:  106    Temp: 97.6 F (36.4 C)   98 F (36.7 C)  TempSrc: Oral   Oral  Resp:  12    Height:      Weight:      SpO2:  99%      Intake/Output Summary (Last 24 hours) at 10/24/13 1155 Last data filed at 10/24/13 0815  Gross per 24 hour  Intake 1481.9 ml  Output    570 ml  Net  911.9 ml    Telemetry reveals atrial fibrillation, ventricular rates 90-110  GEN- The patient is ill appearing, alert and oriented x 3 today.   Head- normocephalic, atraumatic Eyes-  Sclera clear, conjunctiva pink Ears- hearing intact Oropharynx- clear Neck- supple  Lungs- Clear to ausculation bilaterally, normal work of  breathing Heart- irregular rate and rhythm  GI- soft, NT, ND, + BS Extremities- no clubbing, cyanosis, + dependant edema   LABS: Basic Metabolic Panel:  Recent Labs  10/22/13 0420  10/22/13 1600  10/23/13 1800 10/24/13 0402  NA 136*  < >  --   < > 129* 131*  K 4.2  < >  --   < > 4.2 4.5  CL 101  < >  --   < > 94* 97  CO2 21  --   --   < > 24 24  GLUCOSE 118*  < >  --   < > 118* 108*  BUN 10  < >  --   < > 27* 32*  CREATININE 0.71  < > 1.22  < > 1.18 1.29  CALCIUM 8.1*  --   --   < > 8.2* 8.3*  MG 2.6*  --  2.4  --   --   --   < > = values in this interval not displayed. CBC:  Recent Labs  10/23/13 1800 10/24/13 0402  WBC 17.3* 15.6*  HGB 8.4* 8.1*  HCT 24.3* 23.0*  MCV 87.4 88.5  PLT 232 217   Thyroid Function  Tests:  Recent Labs  10/22/13 0420  TSH 1.000    RADIOLOGY: Dg Chest 2 View 10/17/2013   CLINICAL DATA:  sob. a fib.  EXAM: CHEST  2 VIEW  COMPARISON:  DG CHEST 2V dated 07/10/2013; DG CHEST 2V dated 07/09/2012; DG CHEST 1V PORT dated 07/31/2010; DG CHEST 2 VIEW dated 02/11/2008  FINDINGS: There is bilateral chronic interstitial disease. There are bilateral calcified pleural plaques. There is no new focal consolidation, pleural effusion or pneumothorax. The heart and mediastinal contours are unremarkable. There is aortic atherosclerosis.  There is mild degenerative disc disease of the thoracic spine.  IMPRESSION: 1. No acute cardiopulmonary disease. 2. Bilateral pleural plaques and chronic interstitial disease as can be seen with prior asbestos exposure.   Electronically Signed   By: Kathreen Devoid   On: 10/17/2013 13:15   Dg Chest Port 1 View 10/24/2013   CLINICAL DATA:  Left chest tube removal  EXAM: PORTABLE CHEST - 1 VIEW  COMPARISON:  10/23/2013  FINDINGS: Left chest tube has been removed. Right IJ vascular sheath remains. Postop changes from coronary bypass. Calcified pleural plaques noted bilaterally. Stable cardiomegaly with vascular congestion and dense left  base collapse/ consolidation. This completely obscures the left hemidiaphragm. Small residual left effusion not excluded. No pneumothorax.  IMPRESSION: Left chest tube removed. No developing pneumothorax. Stable chest exam.   Electronically Signed   By: Daryll Brod M.D.   On: 10/24/2013 08:16   ASSESSMENT AND PLAN:  Active Problems:   Atrial fibrillation   NSTEMI (non-ST elevated myocardial infarction)   Left main coronary artery disease   Hx of asbestosis   S/P CABG x 4  1 coronary artery disease status post coronary artery bypass graft-Continue aspirin. Add high-dose statin prior to discharge.  2 atrial fibrillation Resume Coumadin when okay with surgery. Cardiovert 4-6 weeks after discharge once amiodarone fully loaded. Continue amiodarone for rate control in the interim.  No additional changes given soft BP. 3 ischemic cardiomyopathy-hopefully LV function will improve following revascularization. Continue metoprolol. Add ACE inhibitor later if blood pressure allows.  4 Postoperative volume excess-continue diuresis  5 Acute kidney injury-CR mildly increased postoperatively; follow  Cardiology to see as needed over the weekend.

## 2013-10-24 NOTE — Progress Notes (Signed)
3 Days Post-Op Procedure(s) (LRB): CORONARY ARTERY BYPASS GRAFTING (CABG) (N/A) INTRAOPERATIVE TRANSESOPHAGEAL ECHOCARDIOGRAM (N/A) CLIPPING OF ATRIAL APPENDAGE Subjective: Feels better today Still not much of an appetite  Objective: Vital signs in last 24 hours: Temp:  [97.5 F (36.4 C)-97.9 F (36.6 C)] 97.6 F (36.4 C) (01/10 0747) Pulse Rate:  [82-130] 106 (01/10 0800) Cardiac Rhythm:  [-] Atrial fibrillation (01/10 0800) Resp:  [11-27] 12 (01/10 0800) BP: (90-143)/(55-97) 97/62 mmHg (01/10 0800) SpO2:  [91 %-100 %] 99 % (01/10 0800) Weight:  [161 lb 13.1 oz (73.4 kg)] 161 lb 13.1 oz (73.4 kg) (01/10 0500)  Hemodynamic parameters for last 24 hours:    Intake/Output from previous day: 01/09 0701 - 01/10 0700 In: 1603.4 [P.O.:590; I.V.:1013.4] Out: 685 [Urine:655; Chest Tube:30] Intake/Output this shift: Total I/O In: 36.7 [I.V.:36.7] Out: -   General appearance: alert and no distress Neurologic: intact Heart: irregularly irregular rhythm Lungs: diminished breath sounds bibasilar Abdomen: normal findings: soft, non-tender  Lab Results:  Recent Labs  10/23/13 1800 10/24/13 0402  WBC 17.3* 15.6*  HGB 8.4* 8.1*  HCT 24.3* 23.0*  PLT 232 217   BMET:  Recent Labs  10/23/13 1800 10/24/13 0402  NA 129* 131*  K 4.2 4.5  CL 94* 97  CO2 24 24  GLUCOSE 118* 108*  BUN 27* 32*  CREATININE 1.18 1.29  CALCIUM 8.2* 8.3*    PT/INR:  Recent Labs  10/21/13 1447  LABPROT 20.7*  INR 1.84*   ABG    Component Value Date/Time   PHART 7.411 10/21/2013 2254   HCO3 25.3* 10/21/2013 2254   TCO2 21 10/22/2013 1559   ACIDBASEDEF 2.0 10/21/2013 1822   O2SAT 96.0 10/21/2013 2254   CBG (last 3)   Recent Labs  10/24/13 0011 10/24/13 0343 10/24/13 0744  GLUCAP 111* 114* 112*    Assessment/Plan: S/P Procedure(s) (LRB): CORONARY ARTERY BYPASS GRAFTING (CABG) (N/A) INTRAOPERATIVE TRANSESOPHAGEAL ECHOCARDIOGRAM (N/A) CLIPPING OF ATRIAL APPENDAGE - CV- in a fib, still  a little tachycardic- will restart home diltiazem, dc metoprolol, continue amioadrone  Restart coumadin  RESP- pulmonary hygiene for bibasilar atelectasis  RENAL- creatinine better'  Off dopamine  Will keep foley today to monitor UO  CBG well controlled, change to AC/HS  OOB  Anemia- stable     LOS: 7 days    [name] C 10/24/2013

## 2013-10-24 NOTE — Progress Notes (Signed)
Just started his walk  BP 96/60  Pulse 116  Temp(Src) 97.4 F (36.3 C) (Oral)  Resp 13  Ht 5\' 8"  (1.727 m)  Wt 161 lb 13.1 oz (73.4 kg)  BMI 24.61 kg/m2  SpO2 94%   Intake/Output Summary (Last 24 hours) at 10/24/13 1733 Last data filed at 10/24/13 1500  Gross per 24 hour  Intake 1333.2 ml  Output    445 ml  Net  888.2 ml    CBG OK  No PM labs  Still in a fib rate control is better

## 2013-10-25 ENCOUNTER — Inpatient Hospital Stay (HOSPITAL_COMMUNITY): Payer: Medicare Other

## 2013-10-25 LAB — BASIC METABOLIC PANEL
BUN: 34 mg/dL — ABNORMAL HIGH (ref 6–23)
CHLORIDE: 95 meq/L — AB (ref 96–112)
CO2: 24 mEq/L (ref 19–32)
Calcium: 7.9 mg/dL — ABNORMAL LOW (ref 8.4–10.5)
Creatinine, Ser: 1.04 mg/dL (ref 0.50–1.35)
GFR calc Af Amer: 69 mL/min — ABNORMAL LOW (ref >90)
GFR calc non Af Amer: 60 mL/min — ABNORMAL LOW (ref >90)
Glucose, Bld: 105 mg/dL — ABNORMAL HIGH (ref 70–99)
Potassium: 4.1 mEq/L (ref 3.7–5.3)
Sodium: 131 mEq/L — ABNORMAL LOW (ref 137–147)

## 2013-10-25 LAB — PROTIME-INR
INR: 1.13 (ref 0.00–1.49)
PROTHROMBIN TIME: 14.3 s (ref 11.6–15.2)

## 2013-10-25 LAB — CBC
HEMATOCRIT: 21.7 % — AB (ref 39.0–52.0)
Hemoglobin: 7.8 g/dL — ABNORMAL LOW (ref 13.0–17.0)
MCH: 31.5 pg (ref 26.0–34.0)
MCHC: 35.9 g/dL (ref 30.0–36.0)
MCV: 87.5 fL (ref 78.0–100.0)
PLATELETS: 232 10*3/uL (ref 150–400)
RBC: 2.48 MIL/uL — ABNORMAL LOW (ref 4.22–5.81)
RDW: 15.4 % (ref 11.5–15.5)
WBC: 10.3 10*3/uL (ref 4.0–10.5)

## 2013-10-25 LAB — GLUCOSE, CAPILLARY
Glucose-Capillary: 92 mg/dL (ref 70–99)
Glucose-Capillary: 123 mg/dL — ABNORMAL HIGH (ref 70–99)
Glucose-Capillary: 100 mg/dL — ABNORMAL HIGH (ref 70–99)
Glucose-Capillary: 120 mg/dL — ABNORMAL HIGH (ref 70–99)

## 2013-10-25 LAB — PREPARE RBC (CROSSMATCH)

## 2013-10-25 MED ORDER — FUROSEMIDE 10 MG/ML IJ SOLN
40.0000 mg | Freq: Once | INTRAMUSCULAR | Status: AC
  Administered 2013-10-25: 40 mg via INTRAVENOUS
  Filled 2013-10-25: qty 4

## 2013-10-25 MED ORDER — AMIODARONE LOAD VIA INFUSION
150.0000 mg | Freq: Once | INTRAVENOUS | Status: AC
  Administered 2013-10-25: 150 mg via INTRAVENOUS
  Filled 2013-10-25: qty 83.34

## 2013-10-25 MED ORDER — AMIODARONE IV BOLUS ONLY 150 MG/100ML: 150.0000 mg | Freq: Once | INTRAVENOUS | Status: DC

## 2013-10-25 MED ORDER — WARFARIN SODIUM 5 MG PO TABS
5.0000 mg | ORAL_TABLET | Freq: Every day | ORAL | Status: DC
  Administered 2013-10-25: 5 mg via ORAL
  Filled 2013-10-25 (×2): qty 1

## 2013-10-25 NOTE — Progress Notes (Signed)
4 Days Post-Op Procedure(s) (LRB): CORONARY ARTERY BYPASS GRAFTING (CABG) (N/A) INTRAOPERATIVE TRANSESOPHAGEAL ECHOCARDIOGRAM (N/A) CLIPPING OF ATRIAL APPENDAGE Subjective: Feels poorly today  Objective: Vital signs in last 24 hours: Temp:  [97.2 F (36.2 C)-98.1 F (36.7 C)] 98.1 F (36.7 C) (01/11 1203) Pulse Rate:  [106-134] 106 (01/11 1200) Cardiac Rhythm:  [-] Atrial fibrillation (01/11 1200) Resp:  [12-26] 26 (01/11 1200) BP: (90-121)/(54-77) 108/60 mmHg (01/11 1200) SpO2:  [93 %-97 %] 95 % (01/11 1200) Weight:  [162 lb 7.7 oz (73.7 kg)] 162 lb 7.7 oz (73.7 kg) (01/11 0500)  Hemodynamic parameters for last 24 hours:    Intake/Output from previous day: 01/10 0701 - 01/11 0700 In: 1204.1 [P.O.:360; I.V.:844.1] Out: 512 [Urine:511; Stool:1] Intake/Output this shift: Total I/O In: 353.5 [P.O.:270; I.V.:83.5] Out: 115 [Urine:115]  General appearance: alert, cooperative and no distress Neurologic: intact Heart: irregularly irregular rhythm Lungs: diminished breath sounds bibasilar Abdomen: normal findings: soft, non-tender  Lab Results:  Recent Labs  10/24/13 0402 10/25/13 0441  WBC 15.6* 10.3  HGB 8.1* 7.8*  HCT 23.0* 21.7*  PLT 217 232   BMET:  Recent Labs  10/24/13 0402 10/25/13 0441  NA 131* 131*  K 4.5 4.1  CL 97 95*  CO2 24 24  GLUCOSE 108* 105*  BUN 32* 34*  CREATININE 1.29 1.04  CALCIUM 8.3* 7.9*    PT/INR:  Recent Labs  10/25/13 0441  LABPROT 14.3  INR 1.13   ABG    Component Value Date/Time   PHART 7.411 10/21/2013 2254   HCO3 25.3* 10/21/2013 2254   TCO2 21 10/22/2013 1559   ACIDBASEDEF 2.0 10/21/2013 1822   O2SAT 96.0 10/21/2013 2254   CBG (last 3)   Recent Labs  10/24/13 2143 10/25/13 0736 10/25/13 1202  GLUCAP 101* 92 123*    Assessment/Plan: S/P Procedure(s) (LRB): CORONARY ARTERY BYPASS GRAFTING (CABG) (N/A) INTRAOPERATIVE TRANSESOPHAGEAL ECHOCARDIOGRAM (N/A) CLIPPING OF ATRIAL APPENDAGE POD # 4 CABG CV- still in a  fib with rate in 110-120 range- rebolus with amiodarone  Continue diltiazem  RESP- IS  RENAL- creatinine OK, hyponatremic  Anemia- Hct 21- given his age and general malaise I think he would benefit from transfusion   LOS: 8 days    HENDRICKSON,STEVEN C 10/25/2013

## 2013-10-25 NOTE — Progress Notes (Signed)
Resting comfortably at present  Received 1 unit of PRBC  A fib rate better controlled

## 2013-10-26 ENCOUNTER — Inpatient Hospital Stay (HOSPITAL_COMMUNITY): Payer: Medicare Other

## 2013-10-26 ENCOUNTER — Encounter (HOSPITAL_COMMUNITY): Payer: Self-pay | Admitting: Cardiothoracic Surgery

## 2013-10-26 DIAGNOSIS — Z951 Presence of aortocoronary bypass graft: Secondary | ICD-10-CM

## 2013-10-26 LAB — GLUCOSE, CAPILLARY
GLUCOSE-CAPILLARY: 102 mg/dL — AB (ref 70–99)
GLUCOSE-CAPILLARY: 98 mg/dL (ref 70–99)
Glucose-Capillary: 99 mg/dL (ref 70–99)

## 2013-10-26 LAB — TYPE AND SCREEN
ABO/RH(D): O POS
ANTIBODY SCREEN: NEGATIVE
Unit division: 0

## 2013-10-26 LAB — CBC
HEMATOCRIT: 26.4 % — AB (ref 39.0–52.0)
HEMOGLOBIN: 9.3 g/dL — AB (ref 13.0–17.0)
MCH: 30.9 pg (ref 26.0–34.0)
MCHC: 35.2 g/dL (ref 30.0–36.0)
MCV: 87.7 fL (ref 78.0–100.0)
Platelets: 265 10*3/uL (ref 150–400)
RBC: 3.01 MIL/uL — ABNORMAL LOW (ref 4.22–5.81)
RDW: 15.1 % (ref 11.5–15.5)
WBC: 7.7 10*3/uL (ref 4.0–10.5)

## 2013-10-26 LAB — BASIC METABOLIC PANEL
BUN: 29 mg/dL — AB (ref 6–23)
CALCIUM: 7.9 mg/dL — AB (ref 8.4–10.5)
CO2: 25 mEq/L (ref 19–32)
Chloride: 93 mEq/L — ABNORMAL LOW (ref 96–112)
Creatinine, Ser: 0.98 mg/dL (ref 0.50–1.35)
GFR calc Af Amer: 80 mL/min — ABNORMAL LOW (ref >90)
GFR, EST NON AFRICAN AMERICAN: 69 mL/min — AB (ref >90)
GLUCOSE: 96 mg/dL (ref 70–99)
Potassium: 3.7 mEq/L (ref 3.7–5.3)
Sodium: 132 mEq/L — ABNORMAL LOW (ref 137–147)

## 2013-10-26 LAB — PROTIME-INR
INR: 1.39 (ref 0.00–1.49)
PROTHROMBIN TIME: 16.7 s — AB (ref 11.6–15.2)

## 2013-10-26 MED ORDER — SODIUM CHLORIDE 0.9 % IJ SOLN: 3.0000 mL | INTRAMUSCULAR | Status: DC | PRN

## 2013-10-26 MED ORDER — BISACODYL 10 MG RE SUPP
10.0000 mg | Freq: Every day | RECTAL | Status: DC | PRN
Start: 2013-10-26 — End: 2013-10-29

## 2013-10-26 MED ORDER — MOVING RIGHT ALONG BOOK
Freq: Once | Status: AC
  Administered 2013-10-26: 1
  Filled 2013-10-26: qty 1

## 2013-10-26 MED ORDER — TRAMADOL HCL 50 MG PO TABS
50.0000 mg | ORAL_TABLET | ORAL | Status: DC | PRN
  Filled 2013-10-26: qty 2

## 2013-10-26 MED ORDER — ONDANSETRON HCL 4 MG PO TABS: 4.0000 mg | ORAL_TABLET | Freq: Four times a day (QID) | ORAL | Status: DC | PRN

## 2013-10-26 MED ORDER — WARFARIN SODIUM 5 MG PO TABS
5.0000 mg | ORAL_TABLET | Freq: Once | ORAL | Status: AC
  Administered 2013-10-26: 5 mg via ORAL
  Filled 2013-10-26 (×2): qty 1

## 2013-10-26 MED ORDER — PANTOPRAZOLE SODIUM 40 MG PO TBEC
40.0000 mg | DELAYED_RELEASE_TABLET | Freq: Every day | ORAL | Status: DC
  Administered 2013-10-26 – 2013-10-29 (×4): 40 mg via ORAL
  Filled 2013-10-26 (×4): qty 1

## 2013-10-26 MED ORDER — FUROSEMIDE 20 MG PO TABS
20.0000 mg | ORAL_TABLET | ORAL | Status: AC
  Administered 2013-10-27 – 2013-10-29 (×3): 20 mg via ORAL
  Filled 2013-10-26 (×5): qty 1

## 2013-10-26 MED ORDER — SODIUM CHLORIDE 0.9 % IV SOLN: 250.0000 mL | INTRAVENOUS | Status: DC | PRN

## 2013-10-26 MED ORDER — SODIUM CHLORIDE 0.9 % IJ SOLN
3.0000 mL | Freq: Two times a day (BID) | INTRAMUSCULAR | Status: DC
  Administered 2013-10-26 – 2013-10-28 (×6): 3 mL via INTRAVENOUS

## 2013-10-26 MED ORDER — AMIODARONE HCL 200 MG PO TABS: 400.0000 mg | ORAL_TABLET | Freq: Every day | ORAL | Status: DC

## 2013-10-26 MED ORDER — LISINOPRIL 2.5 MG PO TABS
2.5000 mg | ORAL_TABLET | Freq: Every day | ORAL | Status: DC
  Administered 2013-10-26 – 2013-10-29 (×4): 2.5 mg via ORAL
  Filled 2013-10-26 (×4): qty 1

## 2013-10-26 MED ORDER — ASPIRIN EC 81 MG PO TBEC
81.0000 mg | DELAYED_RELEASE_TABLET | Freq: Every day | ORAL | Status: DC
  Administered 2013-10-26 – 2013-10-29 (×4): 81 mg via ORAL
  Filled 2013-10-26 (×4): qty 1

## 2013-10-26 MED ORDER — AMIODARONE HCL 200 MG PO TABS
400.0000 mg | ORAL_TABLET | Freq: Two times a day (BID) | ORAL | Status: DC
  Administered 2013-10-26 – 2013-10-29 (×7): 400 mg via ORAL
  Filled 2013-10-26 (×9): qty 2

## 2013-10-26 MED ORDER — INSULIN ASPART 100 UNIT/ML ~~LOC~~ SOLN: 0.0000 [IU] | Freq: Three times a day (TID) | SUBCUTANEOUS | Status: DC

## 2013-10-26 MED ORDER — BISACODYL 5 MG PO TBEC
10.0000 mg | DELAYED_RELEASE_TABLET | Freq: Every day | ORAL | Status: DC | PRN
  Administered 2013-10-27: 10 mg via ORAL
  Filled 2013-10-26 (×2): qty 2

## 2013-10-26 MED ORDER — DIGOXIN 125 MCG PO TABS
0.1250 mg | ORAL_TABLET | Freq: Every day | ORAL | Status: DC
  Administered 2013-10-26 – 2013-10-29 (×4): 0.125 mg via ORAL
  Filled 2013-10-26 (×4): qty 1

## 2013-10-26 MED ORDER — DOCUSATE SODIUM 100 MG PO CAPS
200.0000 mg | ORAL_CAPSULE | Freq: Every day | ORAL | Status: DC
  Administered 2013-10-27: 200 mg via ORAL
  Filled 2013-10-26: qty 2

## 2013-10-26 MED ORDER — METOPROLOL TARTRATE 12.5 MG HALF TABLET
12.5000 mg | ORAL_TABLET | Freq: Two times a day (BID) | ORAL | Status: DC
  Administered 2013-10-26 (×2): 12.5 mg via ORAL
  Filled 2013-10-26 (×4): qty 1

## 2013-10-26 MED ORDER — ONDANSETRON HCL 4 MG/2ML IJ SOLN
4.0000 mg | Freq: Four times a day (QID) | INTRAMUSCULAR | Status: DC | PRN
Start: 2013-10-26 — End: 2013-10-29

## 2013-10-26 MED ORDER — POTASSIUM CHLORIDE CRYS ER 20 MEQ PO TBCR
20.0000 meq | EXTENDED_RELEASE_TABLET | Freq: Two times a day (BID) | ORAL | Status: DC
  Administered 2013-10-26 – 2013-10-29 (×7): 20 meq via ORAL
  Filled 2013-10-26 (×8): qty 1

## 2013-10-26 MED FILL — Sodium Chloride IV Soln 0.9%: INTRAVENOUS | Qty: 2000 | Status: AC

## 2013-10-26 MED FILL — Potassium Chloride Inj 2 mEq/ML: INTRAVENOUS | Qty: 40 | Status: AC

## 2013-10-26 MED FILL — Mannitol IV Soln 20%: INTRAVENOUS | Qty: 500 | Status: AC

## 2013-10-26 MED FILL — Heparin Sodium (Porcine) Inj 1000 Unit/ML: INTRAMUSCULAR | Qty: 30 | Status: AC

## 2013-10-26 MED FILL — Sodium Bicarbonate IV Soln 8.4%: INTRAVENOUS | Qty: 50 | Status: AC

## 2013-10-26 MED FILL — Electrolyte-R (PH 7.4) Solution: INTRAVENOUS | Qty: 2000 | Status: AC

## 2013-10-26 MED FILL — Heparin Sodium (Porcine) Inj 1000 Unit/ML: INTRAMUSCULAR | Qty: 10 | Status: AC

## 2013-10-26 MED FILL — Magnesium Sulfate Inj 50%: INTRAMUSCULAR | Qty: 10 | Status: AC

## 2013-10-26 MED FILL — Lidocaine HCl IV Inj 20 MG/ML: INTRAVENOUS | Qty: 5 | Status: AC

## 2013-10-26 NOTE — Progress Notes (Signed)
Patient ID: [name], male   DOB: May 03, 1918, 80 y.o.   MRN: [id number] [facility] DAILY ICU PROGRESS NOTE                   [address]            [place] [address]          [phone]   5 Days Post-Op Procedure(s) (LRB): CORONARY ARTERY BYPASS GRAFTING (CABG) (N/A) INTRAOPERATIVE TRANSESOPHAGEAL ECHOCARDIOGRAM (N/A) CLIPPING OF ATRIAL APPENDAGE  Total Length of Stay:  LOS: 9 days   Subjective: Feels better, still afib rate better  Objective: Vital signs in last 24 hours: Temp:  [97.3 F (36.3 C)-98.2 F (36.8 C)] 98.2 F (36.8 C) (01/12 0000) Pulse Rate:  [97-134] 117 (01/12 0600) Cardiac Rhythm:  [-] Atrial fibrillation (01/12 0600) Resp:  [11-26] 11 (01/12 0600) BP: (84-138)/(53-90) 138/64 mmHg (01/12 0600) SpO2:  [92 %-99 %] 97 % (01/12 0600) Weight:  [160 lb 4.4 oz (72.7 kg)] 160 lb 4.4 oz (72.7 kg) (01/12 0600)  Filed Weights   10/24/13 0500 10/25/13 0500 10/26/13 0600  Weight: 161 lb 13.1 oz (73.4 kg) 162 lb 7.7 oz (73.7 kg) 160 lb 4.4 oz (72.7 kg)    Weight change: -2 lb 3.3 oz (-1 kg)   Hemodynamic parameters for last 24 hours:    Intake/Output from previous day: 01/11 0701 - 01/12 0700 In: 1179 [P.O.:390; I.V.:514; Blood:275] Out: 1420 [Urine:1420]  Intake/Output this shift:    Current Meds: Scheduled Meds: . acetaminophen  1,000 mg Oral Q6H   Or  . acetaminophen (TYLENOL) oral liquid 160 mg/5 mL  1,000 mg Per Tube Q6H  . aspirin EC  325 mg Oral Daily   Or  . aspirin  324 mg Per Tube Daily  . bisacodyl  10 mg Oral Daily   Or  . bisacodyl  10 mg Rectal Daily  . diltiazem  120 mg Oral Daily  . docusate sodium  200 mg Oral Daily  . enoxaparin (LOVENOX) injection  30 mg Subcutaneous Q24H  . insulin aspart  0-9 Units Subcutaneous TID WC  . insulin detemir  4 Units Subcutaneous Daily  . pantoprazole  40 mg Oral Daily  . sodium chloride  3 mL Intravenous Q12H  . warfarin  5 mg Oral q1800  . Warfarin - Physician Dosing  Inpatient   Does not apply q1800   Continuous Infusions: . sodium chloride Stopped (10/22/13 0900)  . sodium chloride    . amiodarone (NEXTERONE PREMIX) 360 mg/200 mL dextrose 30 mg/hr (10/26/13 0600)  . DOPamine 1 mcg/kg/min (10/23/13 0700)  . DOPamine Stopped (10/24/13 0000)   PRN Meds:.metoprolol, morphine injection, ondansetron (ZOFRAN) IV, sodium chloride, traMADol  General appearance: alert, cooperative and no distress Neurologic: intact Heart: irregularly irregular rhythm Lungs: diminished breath sounds bibasilar Abdomen: soft, non-tender; bowel sounds normal; no masses,  no organomegaly Extremities: extremities normal, atraumatic, no cyanosis or edema and Homans sign is negative, no sign of DVT Wound: sternum stable  Lab Results: CBC: Recent Labs  10/25/13 0441 10/26/13 0345  WBC 10.3 7.7  HGB 7.8* 9.3*  HCT 21.7* 26.4*  PLT 232 265   BMET:  Recent Labs  10/25/13 0441 10/26/13 0345  NA 131* 132*  K 4.1 3.7  CL 95* 93*  CO2 24 25  GLUCOSE 105* 96  BUN 34* 29*  CREATININE 1.04 0.98  CALCIUM 7.9* 7.9*    PT/INR:  Recent Labs  10/26/13 0345  LABPROT 16.7*  INR 1.39   Radiology: Dg Chest Port 1 View  10/25/2013   CLINICAL DATA:  Pleural effusion, coronary bypass  EXAM: PORTABLE CHEST - 1 VIEW  COMPARISON:  10/24/2013.  FINDINGS: Right IJ vascular sheath remains. Postop changes from coronary bypass. Calcified pleural plaques project over both lungs. Low lung volumes persist with dense left base atelectasis/ collapse. Small associated left effusion. No pneumothorax  IMPRESSION: Stable low lung volumes with left base atelectasis/collapse   Electronically Signed   By: Daryll Brod M.D.   On: 10/25/2013 08:34     Assessment/Plan: S/P Procedure(s) (LRB): CORONARY ARTERY BYPASS GRAFTING (CABG) (N/A) INTRAOPERATIVE TRANSESOPHAGEAL ECHOCARDIOGRAM (N/A) CLIPPING OF ATRIAL APPENDAGE Mobilize Diuresis Plan for transfer to step-down: see transfer orders Renal  function normalized Start coumadin    Denys Labree B 10/26/2013 7:30 AM

## 2013-10-26 NOTE — Progress Notes (Signed)
[facility] BRIEF SICU PROGRESS NOTE  5 Days Post-Op  S/P Procedure(s) (LRB): CORONARY ARTERY BYPASS GRAFTING (CABG) (N/A) INTRAOPERATIVE TRANSESOPHAGEAL ECHOCARDIOGRAM (N/A) CLIPPING OF ATRIAL APPENDAGE   Stable day Maintaining NSR Waiting for step down bed to transfer  Plan: Continue current plan  Rexene Alberts 10/26/2013 5:45 PM

## 2013-10-26 NOTE — Progress Notes (Signed)
  Amiodarone Drug - Drug Interaction Consult Note  Recommendations: Monitor INR closely Monitor QTc, vitals Amiodarone is metabolized by the cytochrome P450 system and therefore has the potential to cause many drug interactions. Amiodarone has an average plasma half-life of 50 days (range 20 to 100 days).   There is potential for drug interactions to occur several weeks or months after stopping treatment and the onset of drug interactions may be slow after initiating amiodarone.   []  Statins: Increased risk of myopathy. Simvastatin- restrict dose to 20mg  daily. Other statins: counsel patients to report any muscle pain or weakness immediately.  [x]  Anticoagulants: Amiodarone can increase anticoagulant effect. Consider warfarin dose reduction. Patients should be monitored closely and the dose of anticoagulant altered accordingly, remembering that amiodarone levels take several weeks to stabilize.  []  Antiepileptics: Amiodarone can increase plasma concentration of phenytoin, the dose should be reduced. Note that small changes in phenytoin dose can result in large changes in levels. Monitor patient and counsel on signs of toxicity.  [x]  Beta blockers: increased risk of bradycardia, AV block and myocardial depression. Sotalol - avoid concomitant use.  []   Calcium channel blockers (diltiazem and verapamil): increased risk of bradycardia, AV block and myocardial depression.  []   Cyclosporine: Amiodarone increases levels of cyclosporine. Reduced dose of cyclosporine is recommended.  []  Digoxin dose should be halved when amiodarone is started.  []  Diuretics: increased risk of cardiotoxicity if hypokalemia occurs.  []  Oral hypoglycemic agents (glyburide, glipizide, glimepiride): increased risk of hypoglycemia. Patient's glucose levels should be monitored closely when initiating amiodarone therapy.   [x]  Drugs that prolong the QT interval:  Torsades de pointes risk may be increased with concurrent  use - avoid if possible.  Monitor QTc, also keep magnesium/potassium WNL if concurrent therapy can't be avoided. [name] Antibiotics: e.g. fluoroquinolones, erythromycin. . Antiarrhythmics: e.g. quinidine, procainamide, disopyramide, sotalol. . Antipsychotics: e.g. phenothiazines, haloperidol.  . Lithium, tricyclic antidepressants, and methadone. Thank You,  Narda Bonds  10/26/2013 8:09 AM

## 2013-10-26 NOTE — Progress Notes (Signed)
SUBJECTIVE: Nausea improving, he is eating better. He feels better today, denies SOB or CP. He started ambulating. He will be tranferred out of unit today.  CURRENT MEDICATIONS: . acetaminophen  1,000 mg Oral Q6H   Or  . acetaminophen (TYLENOL) oral liquid 160 mg/5 mL  1,000 mg Per Tube Q6H  . aspirin EC  325 mg Oral Daily   Or  . aspirin  324 mg Per Tube Daily  . bisacodyl  10 mg Oral Daily   Or  . bisacodyl  10 mg Rectal Daily  . diltiazem  120 mg Oral Daily  . docusate sodium  200 mg Oral Daily  . enoxaparin (LOVENOX) injection  30 mg Subcutaneous Q24H  . insulin aspart  0-9 Units Subcutaneous TID WC  . insulin detemir  4 Units Subcutaneous Daily  . pantoprazole  40 mg Oral Daily  . sodium chloride  3 mL Intravenous Q12H  . warfarin  2.5 mg Oral ONCE-1800  . Warfarin - Physician Dosing Inpatient   Does not apply q1800   . amiodarone (NEXTERONE PREMIX) 360 mg/200 mL dextrose 30 mg/hr (10/24/13 0815)    OBJECTIVE: Physical Exam: Filed Vitals:   10/26/13 0400 10/26/13 0500 10/26/13 0600 10/26/13 0823  BP: 118/69 99/63 138/64   Pulse: 103 102 117   Temp:    97.4 F (36.3 C)  TempSrc:    Oral  Resp: 12 12 11    Height:      Weight:   160 lb 4.4 oz (72.7 kg)   SpO2: 98% 99% 97%     Intake/Output Summary (Last 24 hours) at 10/26/13 0848 Last data filed at 10/26/13 0600  Gross per 24 hour  Intake 1162.3 ml  Output   1395 ml  Net -232.7 ml    Telemetry reveals atrial fibrillation, ventricular rates 90-110  GEN- The patient is ill appearing, alert and oriented x 3 today.   Head- normocephalic, atraumatic Eyes-  Sclera clear, conjunctiva pink Ears- hearing intact Oropharynx- clear Neck- supple  Lungs- crackles at basis bilaterally, normal work of breathing Heart- irregular rate and rhythm  GI- soft, NT, ND, + BS Extremities- no clubbing, cyanosis, + dependant edema  LABS: Basic Metabolic Panel:  Recent Labs  10/25/13 0441 10/26/13 0345  NA 131* 132*    K 4.1 3.7  CL 95* 93*  CO2 24 25  GLUCOSE 105* 96  BUN 34* 29*  CREATININE 1.04 0.98  CALCIUM 7.9* 7.9*   CBC:  Recent Labs  10/25/13 0441 10/26/13 0345  WBC 10.3 7.7  HGB 7.8* 9.3*  HCT 21.7* 26.4*  MCV 87.5 87.7  PLT 232 265   Dg Chest Port 1 View 10/24/2013   CLINICAL DATA:  Left chest tube removal  EXAM: PORTABLE CHEST - 1 VIEW  COMPARISON:  10/23/2013  FINDINGS: Left chest tube has been removed. Right IJ vascular sheath remains. Postop changes from coronary bypass. Calcified pleural plaques noted bilaterally. Stable cardiomegaly with vascular congestion and dense left base collapse/ consolidation. This completely obscures the left hemidiaphragm. Small residual left effusion not excluded. No pneumothorax.  IMPRESSION: Left chest tube removed. No developing pneumothorax. Stable chest exam.   Electronically Signed   By: Daryll Brod M.D.   On: 10/24/2013 08:16   Echo: 10/18/2012 Study Conclusions  - Left ventricle: The cavity size was normal. Systolic function was moderately reduced. The estimated ejection fraction was in the range of 35% to 40%. Moderate diffuse hypokinesis. Possible severe hypokinesis of the mid-distalanteroseptal and apical myocardium. -  Aortic valve: Trivial regurgitation. - Mitral valve: Moderate regurgitation. - Left atrium: The atrium was moderately to severely dilated. - Pulmonary arteries: Systolic pressure was mildly to moderately increased. PA peak pressure: 52mm Hg (S).    ASSESSMENT AND PLAN:  Active Problems:   Atrial fibrillation   NSTEMI (non-ST elevated myocardial infarction)   Left main coronary artery disease   Hx of asbestosis   S/P CABG x 4  Was admitted 1-3 with afib with RVR and new LV dysfunction.  Catheterization demonstrated multivessel CAD with subsequent CABG 1-7 along with atrial appendage clipping. Currently on IV Amiodarone and Diltiazem for rate control.  Warfarin to be resumed today.   1. CAD, s/p CABG, post op day  #5, continue aspirin and simvastatin   2. Atrial fibrillation with RVR, ventricular rates 130/minute, on Coumadin, on iv Amiodarone day # 3, agree with switching to PO. On Diltiazem PO, no room for uptitrating with low BP, we will add low dose Digoxin. Rate control for now. Long term goal  when is to cardiovert 4-6 weeks after discharge once amiodarone fully loaded.  3. Ischemic CMP - hopefully LV function will improve following revascularization, preop LVEF 35-40%, we will need to repeat echo 6 weeks post surgery to re-evaluate LVEF and potential need for an ICD.  Continue metoprolol and ACEI.    4. Postoperative volume excess, AKI resolved - continue diuresis   Ena Dawley, H 10/26/2013

## 2013-10-27 DIAGNOSIS — I2589 Other forms of chronic ischemic heart disease: Secondary | ICD-10-CM

## 2013-10-27 DIAGNOSIS — I482 Chronic atrial fibrillation, unspecified: Secondary | ICD-10-CM | POA: Diagnosis present

## 2013-10-27 DIAGNOSIS — I480 Paroxysmal atrial fibrillation: Secondary | ICD-10-CM | POA: Diagnosis present

## 2013-10-27 DIAGNOSIS — I214 Non-ST elevation (NSTEMI) myocardial infarction: Secondary | ICD-10-CM

## 2013-10-27 DIAGNOSIS — I255 Ischemic cardiomyopathy: Secondary | ICD-10-CM | POA: Diagnosis present

## 2013-10-27 DIAGNOSIS — Z7901 Long term (current) use of anticoagulants: Secondary | ICD-10-CM

## 2013-10-27 LAB — GLUCOSE, CAPILLARY
GLUCOSE-CAPILLARY: 102 mg/dL — AB (ref 70–99)
Glucose-Capillary: 88 mg/dL (ref 70–99)
Glucose-Capillary: 93 mg/dL (ref 70–99)
Glucose-Capillary: 101 mg/dL — ABNORMAL HIGH (ref 70–99)
Glucose-Capillary: 99 mg/dL (ref 70–99)

## 2013-10-27 LAB — CBC
HCT: 28.1 % — ABNORMAL LOW (ref 39.0–52.0)
Hemoglobin: 10 g/dL — ABNORMAL LOW (ref 13.0–17.0)
MCH: 30.9 pg (ref 26.0–34.0)
MCHC: 35.6 g/dL (ref 30.0–36.0)
MCV: 86.7 fL (ref 78.0–100.0)
Platelets: 323 10*3/uL (ref 150–400)
RBC: 3.24 MIL/uL — ABNORMAL LOW (ref 4.22–5.81)
RDW: 14.9 % (ref 11.5–15.5)
WBC: 7 10*3/uL (ref 4.0–10.5)

## 2013-10-27 LAB — BASIC METABOLIC PANEL
BUN: 24 mg/dL — ABNORMAL HIGH (ref 6–23)
CO2: 25 mEq/L (ref 19–32)
Calcium: 8.2 mg/dL — ABNORMAL LOW (ref 8.4–10.5)
Chloride: 93 mEq/L — ABNORMAL LOW (ref 96–112)
Creatinine, Ser: 0.84 mg/dL (ref 0.50–1.35)
GFR calc Af Amer: 85 mL/min — ABNORMAL LOW (ref >90)
GFR calc non Af Amer: 73 mL/min — ABNORMAL LOW (ref >90)
Glucose, Bld: 93 mg/dL (ref 70–99)
Potassium: 4.3 mEq/L (ref 3.7–5.3)
Sodium: 130 mEq/L — ABNORMAL LOW (ref 137–147)

## 2013-10-27 LAB — PROTIME-INR
INR: 1.55 — ABNORMAL HIGH (ref 0.00–1.49)
Prothrombin Time: 18.2 seconds — ABNORMAL HIGH (ref 11.6–15.2)

## 2013-10-27 MED ORDER — WARFARIN SODIUM 5 MG PO TABS
5.0000 mg | ORAL_TABLET | Freq: Every day | ORAL | Status: AC
  Administered 2013-10-27: 5 mg via ORAL
  Filled 2013-10-27: qty 1

## 2013-10-27 MED ORDER — METOPROLOL TARTRATE 12.5 MG HALF TABLET
12.5000 mg | ORAL_TABLET | Freq: Once | ORAL | Status: AC
  Administered 2013-10-27: 12.5 mg via ORAL
  Filled 2013-10-27: qty 1

## 2013-10-27 MED ORDER — DOCUSATE SODIUM 100 MG PO CAPS: 200.0000 mg | ORAL_CAPSULE | Freq: Two times a day (BID) | ORAL | Status: DC | PRN

## 2013-10-27 MED ORDER — METOPROLOL TARTRATE 25 MG PO TABS
25.0000 mg | ORAL_TABLET | Freq: Two times a day (BID) | ORAL | Status: DC
  Administered 2013-10-27 – 2013-10-29 (×4): 25 mg via ORAL
  Filled 2013-10-27 (×5): qty 1

## 2013-10-27 NOTE — Progress Notes (Signed)
Pt ambulated 250 feet pushing wheelchair; pt tolerated ambulation well; steady gait noted; pt assisted back to bed; call bell w/i reach; will cont. To monitor.

## 2013-10-27 NOTE — Progress Notes (Signed)
EPW d/c at this time per MD order; VSS; pt tolerated well; R EPW site oozed slightly; manual pressure applied; bedrest until 1440; will cont. To monitor.

## 2013-10-27 NOTE — Progress Notes (Signed)
Pt ambulated 200 feet pushing wheelchair; pt took 2 standing rest breaks during walk; pt back to bed after ambulation; call bell and phone w/i reach; will cont. To monitor.

## 2013-10-27 NOTE — Progress Notes (Signed)
29 Pt just walked with RN. We will follow up tomorrow. Graylon Good RN BSN 10/27/2013 1:48 PM

## 2013-10-27 NOTE — Progress Notes (Signed)
      [address]       [place] [address]             [phone]      6 Days Post-Op Procedure(s) (LRB): CORONARY ARTERY BYPASS GRAFTING (CABG) (N/A) INTRAOPERATIVE TRANSESOPHAGEAL ECHOCARDIOGRAM (N/A) CLIPPING OF ATRIAL APPENDAGE  Subjective:  [name] is feeling okay.  He is experiencing diarrhea.  He is ambulating with minimal difficulty.  Objective: Vital signs in last 24 hours: Temp:  [97.6 F (36.4 C)-98.1 F (36.7 C)] 98.1 F (36.7 C) (01/13 0429) Pulse Rate:  [96-124] 118 (01/13 1124) Cardiac Rhythm:  [-] Atrial fibrillation (01/13 0816) Resp:  [15-28] 18 (01/13 0429) BP: (111-156)/(45-81) 111/69 mmHg (01/13 1124) SpO2:  [93 %-99 %] 98 % (01/13 0429) Weight:  [160 lb 12.8 oz (72.938 kg)-161 lb (73.029 kg)] 161 lb (73.029 kg) (01/13 0429)  Intake/Output from previous day: 01/12 0701 - 01/13 0700 In: 843 [P.O.:840; I.V.:3] Out: 450 [Urine:450] Intake/Output this shift: Total I/O In: 360 [P.O.:360] Out: -   General appearance: alert, cooperative and no distress Heart: irregularly irregular rhythm Lungs: clear to auscultation bilaterally Abdomen: soft, non-tender; bowel sounds normal; no masses,  no organomegaly Extremities: edema +1 Wound: clean and dry  Lab Results:  Recent Labs  10/26/13 0345 10/27/13 0458  WBC 7.7 7.0  HGB 9.3* 10.0*  HCT 26.4* 28.1*  PLT 265 323   BMET:  Recent Labs  10/26/13 0345 10/27/13 0458  NA 132* 130*  K 3.7 4.3  CL 93* 93*  CO2 25 25  GLUCOSE 96 93  BUN 29* 24*  CREATININE 0.98 0.84  CALCIUM 7.9* 8.2*    PT/INR:  Recent Labs  10/27/13 0458  LABPROT 18.2*  INR 1.55*   ABG    Component Value Date/Time   PHART 7.411 10/21/2013 2254   HCO3 25.3* 10/21/2013 2254   TCO2 21 10/22/2013 1559   ACIDBASEDEF 2.0 10/21/2013 1822   O2SAT 96.0 10/21/2013 2254   CBG (last 3)   Recent Labs  10/26/13 2148 10/27/13 0608 10/27/13 1115  GLUCAP 98 93 101*    Assessment/Plan: S/P Procedure(s)  (LRB): CORONARY ARTERY BYPASS GRAFTING (CABG) (N/A) INTRAOPERATIVE TRANSESOPHAGEAL ECHOCARDIOGRAM (N/A) CLIPPING OF ATRIAL APPENDAGE  1. CV- Atrial Fibrillation- on Lopressor, Amiodarone, Cardizem- ideally would benefit from Cardioversion in future 2. Pulm- no acute issue, encouraged IS 3. INR 1.55- continue Coumadin at 5 mg daily 4. Renal- creatinine WNL, volume overloaded, weight is up 16 lbs since admission- on Lasix 5. GI- diarrhea, will stop all stool softners, previous Ileus, will follow 6. Dispo- patient progressing, continue current care   LOS: 10 days    Ellwood Handler 10/27/2013

## 2013-10-27 NOTE — Progress Notes (Signed)
HR up to 120 while sitting in chair; Metoprolol PO given; will attempt to ambulate pt after lunch; will cont. To monitor HR.

## 2013-10-27 NOTE — Progress Notes (Signed)
Subjective:  Up in chair, no SOB.  [name] amiodarone  400 mg Oral Q12H   Followed by  . [START ON 11/02/2013] amiodarone  400 mg Oral Daily  . aspirin EC  81 mg Oral Daily  . digoxin  0.125 mg Oral Daily  . diltiazem  120 mg Oral Daily  . docusate sodium  200 mg Oral Daily  . enoxaparin (LOVENOX) injection  30 mg Subcutaneous Q24H  . furosemide  20 mg Oral Q24H  . insulin aspart  0-24 Units Subcutaneous TID AC & HS  . insulin detemir  4 Units Subcutaneous Daily  . lisinopril  2.5 mg Oral Daily  . metoprolol tartrate  12.5 mg Oral BID  . pantoprazole  40 mg Oral QAC breakfast  . potassium chloride  20 mEq Oral BID  . sodium chloride  3 mL Intravenous Q12H  . Warfarin - Physician Dosing Inpatient   Does not apply q1800    Objective:  Vital Signs in the last 24 hours: Temp:  [97.2 F (36.2 C)-98.1 F (36.7 C)] 98.1 F (36.7 C) (01/13 0429) Pulse Rate:  [96-124] 119 (01/13 0429) Resp:  [15-28] 18 (01/13 0429) BP: (103-156)/(45-81) 132/77 mmHg (01/13 0429) SpO2:  [93 %-99 %] 98 % (01/13 0429) Weight:  [160 lb 12.8 oz (72.938 kg)-161 lb (73.029 kg)] 161 lb (73.029 kg) (01/13 0429)  Intake/Output from previous day:  Intake/Output Summary (Last 24 hours) at 10/27/13 1006 Last data filed at 10/27/13 0848  Gross per 24 hour  Intake    723 ml  Output    450 ml  Net    273 ml    Physical Exam: GEN- The patient is ill appearing, alert and oriented x 3 today.  Head- normocephalic, atraumatic  Eyes- Sclera clear, conjunctiva pink  Ears- hearing intact  Oropharynx- clear  Neck- supple  Lungs- crackles at basis bilaterally, normal work of breathing  Heart- irregular rate and rhythm  GI- soft, NT, ND, + BS  Extremities- no clubbing, cyanosis, + dependant edema  Lab Results:  Recent Labs  10/26/13 0345 10/27/13 0458  WBC 7.7 7.0  HGB 9.3* 10.0*  PLT 265 323    Recent Labs  10/26/13 0345 10/27/13 0458  NA 132* 130*  K 3.7 4.3  CL 93* 93*  CO2 25 25  GLUCOSE 96 93   BUN 29* 24*  CREATININE 0.98 0.84   No results found for this basename: TROPONINI, CK, MB,  in the last 72 hours  Recent Labs  10/27/13 0458  INR 1.55*   Telemetry: A-fib with RVR, 105-120 BPM   Assessment/Plan:   Principal Problem:   NSTEMI (non-ST elevated myocardial infarction) Active Problems:   Atrial fibrillation with rapid ventricular response on adm 10/17/13   S/P CABG x 4 with Maze- Oct 21 2013   Left main coronary artery disease   PAF history   Chronic anticoagulation   Cardiomyopathy, ischemic- EF 35-40% echo 10/19/12   Essential hypertension, benign   Hx of asbestosis  PLAN: He is back in AF despite Amiodarone, Coumadin, Lanoxin, Diltiazem, and Lopressor. Consider stopping (?) Diltiazem if he converts. Renal function stable on low dose ACE. He was previously in [facility] independent living.   Kerin Ransom PA-C Beeper [phone] 10/27/2013, 10:06 AM   1. CAD, s/p CABG, post op day #6, continue aspirin and simvastatin   2. Atrial fibrillation with RVR, ventricular rates minimally improved, now 105-120/minute, on Coumadin, now on PO Amiodarone, On Diltiazem PO, we will increase metoprolol  to 25 mg po BID, continue low dose Digoxin. Rate control for now. Long term goal when is to cardiovert 4-6 weeks after discharge once amiodarone fully loaded.   3. Ischemic CMP - hopefully LV function will improve following revascularization, preop LVEF 35-40%, we will need to repeat echo 6 weeks post surgery to re-evaluate LVEF and potential need for an ICD.  Continue metoprolol and ACEI.   4. Postoperative volume excess, AKI resolved - continue diuresis   Ena Dawley, H  10/27/2013

## 2013-10-28 LAB — GLUCOSE, CAPILLARY: Glucose-Capillary: 96 mg/dL (ref 70–99)

## 2013-10-28 LAB — PROTIME-INR
INR: 2.09 — ABNORMAL HIGH (ref 0.00–1.49)
Prothrombin Time: 22.8 seconds — ABNORMAL HIGH (ref 11.6–15.2)

## 2013-10-28 MED ORDER — ATORVASTATIN CALCIUM 10 MG PO TABS
10.0000 mg | ORAL_TABLET | Freq: Every day | ORAL | Status: DC
  Administered 2013-10-28: 10 mg via ORAL
  Filled 2013-10-28 (×2): qty 1

## 2013-10-28 MED ORDER — WARFARIN SODIUM 3 MG PO TABS
3.0000 mg | ORAL_TABLET | Freq: Once | ORAL | Status: AC
  Administered 2013-10-28: 3 mg via ORAL
  Filled 2013-10-28: qty 1

## 2013-10-28 NOTE — Progress Notes (Signed)
Subjective: Doing well with exception of sleep.  Objective: Vital signs in last 24 hours: Temp:  [98 F (36.7 C)-98.3 F (36.8 C)] 98 F (36.7 C) (01/14 0334) Pulse Rate:  [99-118] 107 (01/14 1102) Resp:  [16-20] 18 (01/14 0334) BP: (97-132)/(50-79) 124/68 mmHg (01/14 1101) SpO2:  [95 %-96 %] 95 % (01/14 0334) Weight:  [159 lb 2.8 oz (72.2 kg)] 159 lb 2.8 oz (72.2 kg) (01/14 0334) Last BM Date: 10/27/13  Intake/Output from previous day: 01/13 0701 - 01/14 0700 In: 720 [P.O.:720] Out: 750 [Urine:750] Intake/Output this shift: Total I/O In: 120 [P.O.:120] Out: -   Medications Current Facility-Administered Medications  Medication Dose Route Frequency Provider Last Rate Last Dose  . 0.9 %  sodium chloride infusion  250 mL Intravenous PRN Grace Isaac, MD      . amiodarone (PACERONE) tablet 400 mg  400 mg Oral Q12H Grace Isaac, MD   400 mg at 10/28/13 1101   Followed by  . [START ON 11/02/2013] amiodarone (PACERONE) tablet 400 mg  400 mg Oral Daily Grace Isaac, MD      . aspirin EC tablet 81 mg  81 mg Oral Daily Grace Isaac, MD   81 mg at 10/28/13 1101  . bisacodyl (DULCOLAX) EC tablet 10 mg  10 mg Oral Daily PRN Grace Isaac, MD   10 mg at 10/27/13 0846   Or  . bisacodyl (DULCOLAX) suppository 10 mg  10 mg Rectal Daily PRN Grace Isaac, MD      . digoxin Fonnie Birkenhead) tablet 0.125 mg  0.125 mg Oral Daily Dorothy Spark, MD   0.125 mg at 10/28/13 1101  . diltiazem (CARDIZEM CD) 24 hr capsule 120 mg  120 mg Oral Daily Melrose Nakayama, MD   120 mg at 10/28/13 1101  . docusate sodium (COLACE) capsule 200 mg  200 mg Oral BID PRN [name] [name], PA-C      . enoxaparin (LOVENOX) injection 30 mg  30 mg Subcutaneous Q24H Grace Isaac, MD   30 mg at 10/28/13 1102  . furosemide (LASIX) tablet 20 mg  20 mg Oral Q24H Grace Isaac, MD   20 mg at 10/28/13 1102  . lisinopril (PRINIVIL,ZESTRIL) tablet 2.5 mg  2.5 mg Oral Daily Grace Isaac,  MD   2.5 mg at 10/28/13 1101  . metoprolol tartrate (LOPRESSOR) tablet 25 mg  25 mg Oral BID Dorothy Spark, MD   25 mg at 10/28/13 1102  . ondansetron (ZOFRAN) tablet 4 mg  4 mg Oral Q6H PRN Grace Isaac, MD       Or  . ondansetron [facility]) injection 4 mg  4 mg Intravenous Q6H PRN Grace Isaac, MD      . pantoprazole (PROTONIX) EC tablet 40 mg  40 mg Oral QAC breakfast Grace Isaac, MD   40 mg at 10/28/13 0610  . potassium chloride SA (K-DUR,KLOR-CON) CR tablet 20 mEq  20 mEq Oral BID Grace Isaac, MD   20 mEq at 10/28/13 1102  . sodium chloride 0.9 % injection 3 mL  3 mL Intravenous Q12H Grace Isaac, MD   3 mL at 10/28/13 1105  . sodium chloride 0.9 % injection 3 mL  3 mL Intravenous PRN Grace Isaac, MD      . traMADol Veatrice Bourbon) tablet 50-100 mg  50-100 mg Oral Q4H PRN Grace Isaac, MD      . warfarin (COUMADIN) tablet 3 mg  3 mg Oral ONCE-1800 [name] [name], PA-C      . Warfarin - Physician Dosing Inpatient   Does not apply q1800 Grace Isaac, MD        PE: General appearance: alert, cooperative and no distress Lungs: Decreased BS on the left > right.  No wheeze or rales. Extremities: 2+ LEE Pulses: 1+ PT pulses.  2+ radials Skin: Warm and dry Neurologic: Grossly normal.  Lab Results:   Recent Labs  10/26/13 0345 10/27/13 0458  WBC 7.7 7.0  HGB 9.3* 10.0*  HCT 26.4* 28.1*  PLT 265 323   BMET  Recent Labs  10/26/13 0345 10/27/13 0458  NA 132* 130*  K 3.7 4.3  CL 93* 93*  CO2 25 25  GLUCOSE 96 93  BUN 29* 24*  CREATININE 0.98 0.84  CALCIUM 7.9* 8.2*   PT/INR  Recent Labs  10/26/13 0345 10/27/13 0458 10/28/13 0410  LABPROT 16.7* 18.2* 22.8*  INR 1.39 1.55* 2.09*    Assessment/Plan   Principal Problem:   NSTEMI (non-ST elevated myocardial infarction) Active Problems:   Essential hypertension, benign   Atrial fibrillation with rapid ventricular response on adm 10/17/13   Left main coronary artery disease   Hx of  asbestosis   S/P CABG x 4 with Maze- Oct 21 2013   PAF history   Chronic anticoagulation   Cardiomyopathy, ischemic- EF 35-40% echo 10/19/12  Plan:   POD #7  CABG x4(LIMA to LAD, SVG to OM and Circ, SVG to distal RCA), Atrial clip placement.  Afib Rate very consistent around 100.  On amiodarone 400mg  daily.  DCCV in 4-6 weeks.  2D echo in 6 weeks.  Still volume overloaded.  Continue to diureses.  ASA, digoxin, lasix 20mg  daily, lopressor 25 bid, Coumadin with therapeutic INR.  BP appropriate now.  A little hypotensive earlier.  DC likely tomorrow.    LOS: 11 days    HAGER, BRYAN 10/28/2013 11:43 AM  The patient was seen, examined and discussed with Tarri Fuller, PA-C and I agree with the above.   1. CAD, s/p CABG, post op day #7, continue aspirin and simvastatin, metoprolol  2. Atrial fibrillation with RVR, ventricular rates improved, now 95-118/minute, on Coumadin, now on PO Amiodarone, On Diltiazem PO,  metoprolol to 25 mg po BID,  Digoxin. Rate control for now. Long term goal when is to cardiovert 4-6 weeks after discharge once amiodarone fully loaded. INR now therapeutic.  3. Ischemic CMP - hopefully LV function will improve following revascularization, preop LVEF 35-40%, we will need to repeat echo 6 weeks post surgery to re-evaluate LVEF and potential need for an ICD.  Continue metoprolol and ACEI, low dose lasix 20 mg po daily, Crea/GFR normal.   4. Postoperative volume excess, AKI resolved - continue diuresis   Anticipated discharge is tomorrow.   Ena Dawley, Lemmie Evens 10/28/2013

## 2013-10-28 NOTE — Discharge Instructions (Signed)
Activity: 1.May walk up steps °               2.No lifting more than ten pounds for four weeks.  °               3.No driving for four weeks. °               4.Stop any activity that causes chest pain, shortness of breath, dizziness,                            sweating or excessive weakness. °               5.Avoid straining. °               6.Continue with your breathing exercises daily. ° °Diet: Diabetic diet and Low fat, Low salt  diet ° °Wound Care: May shower.  Clean wounds with mild soap and water daily. Contact the office at [phone] if any problems arise. ° °Coronary Artery Bypass Grafting, Care After °Refer to this sheet in the next few weeks. These instructions provide you with information on caring for yourself after your procedure. Your health care provider may also give you more specific instructions. Your treatment has been planned according to current medical practices, but problems sometimes occur. Call your health care provider if you have any problems or questions after your procedure. °WHAT TO EXPECT AFTER THE PROCEDURE °Recovery from surgery will be different for everyone. Some people feel well after 3 or 4 weeks, while for others it takes longer. After your procedure, it is typical to have the following: °· Nausea and a lack of appetite.   °· Constipation. °· Weakness and fatigue.   °· Depression or irritability.   °· Pain or discomfort at your incision site. °HOME CARE INSTRUCTIONS °· Only take over-the-counter or prescription medicines as directed by your health care provider. Take all medicines exactly as directed. Do not stop taking medicines or start any new medicines without first checking with your health care provider.   °· Take your pulse as directed by your health care provider. °· Perform deep breathing as directed by your health care provider. If you were given a device called an incentive spirometer, use it to practice deep breathing several times a day. Support your chest with  a pillow or your arms when you take deep breaths or cough. °· Keep incision areas clean, dry, and protected. Remove or change any bandages (dressings) only as directed by your health care provider. You may have skin adhesive strips over the incision areas. Do not take the strips off. They will fall off on their own. °· Check incision areas daily for any swelling, redness, or drainage. °· If incisions were made in your legs, do the following: °· Avoid crossing your legs.   °· Avoid sitting for long periods of time. Change positions every 30 minutes.   °· Elevate your legs when you are sitting.   °· Wear compression stockings as directed by your health care provider. These stockings help keep blood clots from forming in your legs. °· Take showers once your health care provider approves. Until then, only take sponge baths. Pat incisions dry. Do not rub incisions with a washcloth or towel. Do not take tub baths or go swimming until your health care provider approves. °· Eat foods that are high in fiber, such as raw fruits and vegetables, whole grains, beans, and nuts. Meats should be lean cut. Avoid   canned, processed, and fried foods. °· Drink enough fluids to keep your urine clear or pale yellow. °· Weigh yourself every day. This helps identify if you are retaining fluid that may make your heart and lungs work harder.   °· Rest and limit activity as directed by your health care provider. You may be instructed to: °· Stop any activity at once if you have chest pain, shortness of breath, irregular heartbeats, or dizziness. Get help right away if you have any of these symptoms. °· Move around frequently for short periods or take short walks as directed by your health care provider. Increase your activities gradually. You may need physical therapy or cardiac rehabilitation to help strengthen your muscles and build your endurance. °· Avoid lifting, pushing, or pulling anything heavier than 10 lb (4.5 kg) for at least 6  weeks after surgery. °· Do not drive until your health care provider approves.  °· Ask your health care provider when you may return to work and resume sexual activity. °· Follow up with your health care provider as directed.   °SEEK MEDICAL CARE IF: °· You have swelling, redness, increasing pain, or drainage at the site of an incision.   °· You develop a fever.   °· You have swelling in your ankles or legs.   °· You have pain in your legs.   °· You have weight gain of 2 or more pounds a day. °· You are nauseous or vomit. °· You have diarrhea.  °SEEK IMMEDIATE MEDICAL CARE IF: °· You have chest pain that goes to your jaw or arms. °· You have shortness of breath.   °· You have a fast or irregular heartbeat.   °· You notice a "clicking" in your breastbone (sternum) when you move.   °· You have numbness or weakness in your arms or legs. °· You feel dizzy or lightheaded.   °MAKE SURE YOU: °· Understand these instructions. °· Will watch your condition. °· Will get help right away if you are not doing well or get worse. °Document Released: 04/20/2005 Document Revised: 06/03/2013 Document Reviewed: 03/10/2013 °[employer]® Patient Information ©2014 [employer]. ° ° ° °

## 2013-10-28 NOTE — Progress Notes (Signed)
Per RN, patient wants rehab at [facility] instead of [facility], this is the first social worker has heard of this. CSW will complete full assessment and update later.  Jeanette Caprice, MSW, [place]

## 2013-10-28 NOTE — Progress Notes (Addendum)
Pt offered to ambulate prior to going to bed. Pt declined, pt stated they felt to tired to walk. Pt walked around room with 1 assist and transferred to the chair and then back to the bed with no complaints of pain.

## 2013-10-28 NOTE — Progress Notes (Signed)
The Chaplain offered emotional and spiritual support to the patient today. The patient informed the Chaplain that he was not having a good day because of his health was failing him greatly. The patient expressed to the Chaplain that he has not slept in three days because the times he sleeps at home are different from the times the hospital requires him to sleep.The patient's wife explained to the Chaplain in further detail about her husband sleeping habits have been out of control as of lately and she also said that they will be moving her husband to a care center facility to further treat his health once he is released from the hospital in the upcoming day. After the dialogue that had been taken place between the Chaplain and the patient, the Chaplain prayed for the patient and his wife.  Chaplain Clista Bernhardt Azilee Pirro

## 2013-10-28 NOTE — Progress Notes (Signed)
CARDIAC REHAB PHASE I   PRE:  Rate/Rhythm: 93 Afib  BP:  Supine:   Sitting: 102/57  Standing:    SaO2:   MODE:  Ambulation: 300 ft   POST:  Rate/Rhythm: 109 Afib  BP:  Supine:   Sitting: 108/64  Standing:    SaO2: 98 RA 1420-1445 On arrival pt in hall walking with RN pushing w/c. Gait steady. Pt's only c/o was of his legs feeling weak. He was able to walk 300 feet. Pt to recliner after walk with call light in reach and wife present.  Rodney Langton RN 10/28/2013 2:44 PM

## 2013-10-28 NOTE — Progress Notes (Signed)
Clinical Social Work Department BRIEF PSYCHOSOCIAL ASSESSMENT 10/28/2013  Patient:  [name], [name]     Account Number:  [id number]     Admit date:  10/17/2013  Clinical Social Worker:  Megan Salon  Date/Time:  10/28/2013 03:52 PM  Referred by:  RN  Date Referred:  10/28/2013 Referred for  SNF Placement   Other Referral:   Interview type:  Patient Other interview type:    PSYCHOSOCIAL DATA Living Status:  FACILITY Admitted from facility:  [facility] Level of care:  Independent Living Primary support name:  [facility] Primary support relationship to patient:  SPOUSE Degree of support available:   Good    CURRENT CONCERNS Current Concerns  Post-Acute Placement   Other Concerns:    SOCIAL WORK ASSESSMENT / PLAN Per RN, patient and patient's wife are wanting SNF placement at their [place] facility that also has SNF, [facility]. CSW went into room and introduced self to patient and explained that the social worker would be the one helping with the dc to [place]. Patient thanked Education officer, museum for stopping by. Patient stated that he is from [facility] and wants to go to their skilled facility for short term rehab. CSW stated she will have to speak to Admissions Coordinator at [facility] to further discuss bed availability. CSW called Girtha Rm, at [facility] and talked to her about patient coming their for snf. Claiborne Billings stated that she was already aware of patient's interest in snf and is saving a bed for patient. Claiborne Billings also explained to Education officer, museum that the social worker needs to start the [facility] process, but "they will be able to take the patient with pending insurance approval since he is a resident there." CSW will start the insurance auth. process.   Assessment/plan status:  Psychosocial Support/Ongoing Assessment of Needs Other assessment/ plan:   Information/referral to community resources:   SNF     PATIENT'S/FAMILY'S RESPONSE TO PLAN OF CARE: Patient is agreeable to his plan of care and is eager to go to rehab and get stronger.       Jeanette Caprice, MSW, [place]

## 2013-10-28 NOTE — Discharge Summary (Signed)
Physician Discharge Summary       [address]       [place] [address]             [phone]    Patient ID: [name] MRN: [id number] DOB/AGE: [id number] 78 y.o.  Admit date: 10/17/2013 Discharge date: 10/29/2013  Admission Diagnoses: 1. S/p NSTEMI 2. History of a fib 3. History of hypertension 4. History of tobacco abuse 5.History of debility 6.Acute systolic heart failure 7.Cardiomyopathy  Discharge Diagnoses:  1. S/p NSTEMI 2. History of a fib 3. History of hypertension 4. History of tobacco abuse 5.History of debility 6.Acute systolic heart failure 7.Cardiomyopathy 8. ABL anemia  Procedure (s):  CORONARY ANGIOGRAPHY done by Dr. Claiborne Billings on 10/20/13:  Fluoroscopy revealed significant calcification of the coronary arteries as well as calcification of the aorta. In addition, there is extensive pleural calcification suggestive of asbestosis exposure.  1. Left main: Calcified with 80-90% distal left main stenosis.  2. LAD: Calcified with 80-90% ostial stenosis followed by 70% stenosis in the region of the first diagonal takeoff with 80% stenosis at the ostium of this first diagonal vessel. The LAD gave rise to several septal perforating arteries and extended to the apex.  3. Left circumflex: Calcified with 50% proximal stenosis, 80-90% stenosis in the obtuse marginal branch diffusely, and 60 and 70% stenosis in the AV groove circumflex before giving rise to a small marginal branch. There is significant left to right collateralization supplying the distal right coronary artery.  4. Right coronary artery: Patient is in the proximal segment with subtotal 99 100% stenosis in the mid RCA with previously noted significant left to right collaterals predominately supplied the distal RCA territory.  5. Un-bypassed LIMA is widely patent with a normal proximal left subclavian artery, suitable for CABG revascularization surgery.  6. Left ventriculography revealed  moderate LV dysfunction with an ejection fraction of approximately 35% with moderately severe mid distal anterolateral hypocontractility and distal inferior hypokinesis  2.Coronary artery bypass grafting x4 with the left  internal mammary to the left anterior descending coronary artery, sequential reverse saphenous vein graft to the obtuse marginal and circumflex coronary artery, reverse saphenous vein graft to the distal right coronary artery and placement of atrial clip, incidental biopsy, mammary chain lymph node, and bilateral thigh endo vein harvesting.   History of Presenting Illness: The patient is 80 year old who looks younger than his stated age. He has been active exercising daily until about 10 days ago He has a history of atrial fibrillation on coumadin. He is not sure if he has been in chronic afib or just rate contolled, last seen by Dr Caryl Comes 2011, was to have cardioversion but not clear if this was done.[name] He gets along well. He was having some shortness of breath. The morning of admission at 5 AM he was seen and is [facility] home by a nurse and was noted to have an oxygen saturation of 93% with a heart rate 68. However, because of his complaints and was advised he follow the primary provider and he went to urgent care where he was noted to have a rapid heart rate. He was sent to the emergency room where he has been in atrial fibrillation with a rapid rate treated with IV Cardizem. He has also been treated with IV Lasix. He said he had been having trouble flat. He denies any chest pressure, neck or arm discomfort. He's had no weight gain or edema. Of note, his point-of-care troponin  was minimally elevated. His BNP was 7408. Patient was admitted 1/3 and cardiac cath done was done 01/06. Patient was found to have multivessel coronary artery disease. Potential risks, benefits, and complications of the surgery were discussed with the patient and he agreed to proceed with surgery. He underwent a  CABG x 4 on 10/21/2013.  Brief Hospital Course:  The patient was extubated later the evening of surgery without difficulty. He remained afebrile and hemodynamically stable. He was weaned off Amiodarone and Dopamine drips.Gordy Councilman, a line, chest tubes, and foley were removed early in the post operative course. Lopressor was started and titrated accordingly. He was volume over loaded and diuresed. He did have ABL anemia. He did require a post op transfusion. His last H and H was 10 and 28.1.He was weaned off the insulin drip. The patient's HGA1C pre op was 6. He is pre diabetic and will require further surveillance as an outpatient. He did remain in a fib with a controlled ventricular rate. He is currently on Amiodarone 400 bid, Digoxin 0.125 daily, Cardizem CD 120 daily, Lopressor 25 bid, and Coumadin. His PT and INR were monitored daily and Coumadin was adjusted accordingly. His INR this am was up to 2.81. Lovenox was stopped. He will be given 1 mg of Coumadin prior to discharge. As discussed with Dr. Servando Snare, he will be discharged on Coumadin 2.5 mg daily. [facility] will need to draw a PT and INR on Monday 1/19 and fax results to Dr. Jacalyn Lefevre office.The patient was felt surgically stable for transfer from the ICU to [facility] for further convalescence on 10/26/2012. He continues to progress with cardiac rehab. He was ambulating on room air. He has been tolerating a diet and has had a bowel movement. Epicardial pacing wires and chest tube sutures will be removed prior to discharge. He was seen and evaluated by Dr. Servando Snare this morning. He if felt surgically stable for discharge today.   Latest Vital Signs: Blood pressure 108/63, pulse 84, temperature 98.3 F (36.8 C), temperature source Oral, resp. rate 18, height 5\' 8"  (1.727 m), weight 70.943 kg (156 lb 6.4 oz), SpO2 97.00%.  Physical Exam: General appearance: alert, cooperative and no distress  Heart: irregularly irregular rhythm  Lungs: clear to  auscultation bilaterally  Abdomen: soft, non-tender; bowel sounds normal; no masses, no organomegaly  Extremities: trace bilateral LE edema Wound: clean and dry  Discharge Condition:Stable  Recent laboratory studies:  Lab Results  Component Value Date   WBC 7.0 10/27/2013   HGB 10.0* 10/27/2013   HCT 28.1* 10/27/2013   MCV 86.7 10/27/2013   PLT 323 10/27/2013   Lab Results  Component Value Date   NA 130* 10/27/2013   K 4.3 10/27/2013   CL 93* 10/27/2013   CO2 25 10/27/2013   CREATININE 0.84 10/27/2013   GLUCOSE 93 10/27/2013      Diagnostic Studies:  Dg Chest Port 1 View  10/26/2013   CLINICAL DATA:  Post CABG  EXAM: PORTABLE CHEST - 1 VIEW  COMPARISON:  10/25/2013; 10/24/2013; 07/10/2013  FINDINGS: Grossly unchanged enlarged cardiac silhouette and mediastinal contours post median sternotomy and CABG. Stable position of support apparatus. No pneumothorax. Bilateral pleural calcifications are re- demonstrated. The pulmonary vasculature appears less distinct on present examination with cephalization of flow. Worsening bibasilar heterogeneous opacities. Trace bilateral effusions are not excluded. There is a minimal amount of pleural-parenchymal thickening about the right minor fissure. Unchanged bones.  IMPRESSION: 1.  Stable positioning of support apparatus.  No pneumothorax. 2. Findings  suggestive of worsening pulmonary edema and atelectasis. Continued attention on followup is recommended. 3. Sequela of prior asbestos exposure.   Electronically Signed   By: Sandi Mariscal M.D.   On: 10/26/2013 07:52    Future Appointments Provider Department Dept Phone   11/16/2013 4:15 PM Lelon Perla, MD [place] Office [phone]   11/26/2013 11:00 AM Grace Isaac, MD [facility] [phone]      Discharge Medications:   Medication List    STOP taking these medications       MUCINEX DM MAXIMUM STRENGTH 60-1200 MG Tb12      TAKE these  medications       amiodarone 200 MG tablet  Commonly known as:  PACERONE  Take 2 tablets (400 mg total) by mouth every 12 (twelve) hours. For 2 days; then take Amiodarone 200 mg by mouth two times daily thereafter.     aspirin 81 MG EC tablet  Take 1 tablet (81 mg total) by mouth daily.     atorvastatin 10 MG tablet  Commonly known as:  LIPITOR  Take 1 tablet (10 mg total) by mouth daily at 6 PM.     digoxin 0.125 MG tablet  Commonly known as:  LANOXIN  Take 1 tablet (0.125 mg total) by mouth daily.     diltiazem 120 MG 24 hr capsule  Commonly known as:  CARDIZEM CD  Take 1 capsule (120 mg total) by mouth daily.     furosemide 20 MG tablet  Commonly known as:  LASIX  Take 1 tablet (20 mg total) by mouth daily. For 4 days then stop.     lisinopril 2.5 MG tablet  Commonly known as:  PRINIVIL,ZESTRIL  Take 1 tablet (2.5 mg total) by mouth daily.     metoprolol tartrate 25 MG tablet  Commonly known as:  LOPRESSOR  Take 1 tablet (25 mg total) by mouth 2 (two) times daily.     multivitamin tablet  Take 1 tablet by mouth daily.     potassium chloride 10 MEQ tablet  Commonly known as:  K-DUR,KLOR-CON  Take 1 tablet (10 mEq total) by mouth daily. For 4 days then stop.     traMADol 50 MG tablet  Commonly known as:  ULTRAM  Take 1 tablet (50 mg total) by mouth every 6 (six) hours as needed for moderate pain.     warfarin 2.5 MG tablet  Commonly known as:  COUMADIN  Take 1 tablet (2.5 mg total) by mouth daily at 6 PM. Or as directed.       The patient has been discharged on:   1.Beta Blocker:  Yes [  x ]                              No   [   ]                              If No, reason:  2.Ace Inhibitor/ARB: Yes [  x ]                                     No  [    ]  If No, reason:  3.Statin:   Yes [ x  ]                  No  [   ]                  If No, reason:  4.Ecasa:  Yes  [ x  ]                  No   [   ]                   If No, reason:  Follow Up Appointments: Follow-up Information   Follow up with Kirk Ruths, MD On 11/16/2013. (Appointment time is at 4:15 pm)    Specialty:  Cardiology   Contact information:   [address] [place] [address] [phone]       Follow up with Grace Isaac, MD On 11/26/2013. (PA/LAT CXR to be taken (at [facility] which is in the same buidling as Dr. Everrett Coombe office) on  11/26/2013 at 10:00 am;Appointment time with Dr. Servando Snare is 11:00 am)    Specialty:  Cardiothoracic Surgery   Contact information:   [address] [place] [address] [phone]       Follow up with Mathews Argyle, MD. (Call for a follow up regarding HGA1C 6)    Specialty:  Internal Medicine   Contact information:   [address] [place] [address] [phone]       Follow up with [facility] . (PT/INR needs to be drawn Monday 11/02/2013. Plesae fax results to Dr. Jacalyn Lefevre office)       Signed: Lars Pinks MPA-C 10/29/2013, 9:47 AM

## 2013-10-28 NOTE — Progress Notes (Signed)
I discussed with Dr. Meda Coffee the need for a statin. Pre op LFTs within normal limits. Will start low dose (10 mg) Lipitor. Cardiology to follow LFTs. Will inform patient if he has any muscle aches/pains etc to call us immediatly.

## 2013-10-28 NOTE — Evaluation (Signed)
Physical Therapy Evaluation Patient Details Name: [name] MRN: [id number] DOB: February 06, 1918 Today's Date: 10/28/2013 Time: 4854-6270 PT Time Calculation (min): 28 min  PT Assessment / Plan / Recommendation History of Present Illness  pt s/p CABG  Clinical Impression  Although pt has progressed well with walking, he has trouble rolling, mobilizing in/out of bed and transfering from lower surfaces.  He wishes to go to [facility] level to build back his strength and function and not have to rely on or burdin his wife.  He would require minimal assist from his wife at this point and ultimately could go home if she can provide.    PT Assessment  Patient needs continued PT services    Follow Up Recommendations  SNF (or assisted living for brief time to work on bed mob/transfers and progress ambulation)    Does the patient have the potential to tolerate intense rehabilitation      Barriers to Discharge        Equipment Recommendations  None recommended by PT    Recommendations for Other Services     Frequency      Precautions / Restrictions Precautions Precautions: Fall   Pertinent Vitals/Pain EHR up to 120 beats while patient struggling to get OOB and in 110's after amb. In halls.  Quick to recover.      Mobility  Bed Mobility Overal bed mobility: Needs Assistance Bed Mobility: Rolling;Supine to Sit Rolling: Min guard Supine to sit: Min assist General bed mobility comments: pt not moving well in the bed without being able to fully use UE's.  Having difficulty rolling to the side and holding or coming from fully flat position. Transfers Overall transfer level: Needs assistance Transfers: Sit to/from Stand Sit to Stand: Supervision Ambulation/Gait Ambulation/Gait assistance: Supervision Ambulation Distance (Feet): 90 Feet Assistive device: Rolling walker (2 wheeled) Gait Pattern/deviations: Step-through pattern Gait velocity: slower    Exercises     PT  Diagnosis:    PT Problem List: Decreased activity tolerance;Decreased mobility;Cardiopulmonary status limiting activity PT Treatment Interventions:       PT Goals(Current goals can be found in the care plan section)    Visit Information  Last PT Received On: 10/28/13 Assistance Needed: +1 History of Present Illness: pt s/p CABG       Prior Functioning  Home Living Family/patient expects to be discharged to:: Skilled nursing facility Living Arrangements: Spouse/significant other Additional Comments: pt feels significantly less functional than before and believe going back to I living will put a burden on wife. Prior Function Level of Independence: Independent Communication Communication: No difficulties    Cognition  Cognition Arousal/Alertness: Awake/alert Behavior During Therapy: WFL for tasks assessed/performed Overall Cognitive Status: Within Functional Limits for tasks assessed    Extremity/Trunk Assessment Upper Extremity Assessment Upper Extremity Assessment:  (not fully tested due to sternal precautions) Lower Extremity Assessment Lower Extremity Assessment: Overall WFL for tasks assessed Cervical / Trunk Assessment Cervical / Trunk Assessment: Normal;Kyphotic   Balance Balance Overall balance assessment: No apparent balance deficits (not formally assessed)  End of Session PT - End of Session Activity Tolerance: Patient limited by fatigue;Patient tolerated treatment well Patient left: in bed;with call bell/phone within reach Nurse Communication: Mobility status  GP     Chin Wachter, Kacee Koren 10/28/2013, 6:03 PM 10/28/2013  Donnella Sham, [place] [phone]  (pager)

## 2013-10-28 NOTE — Progress Notes (Signed)
Clinical Social Work Department CLINICAL SOCIAL WORK PLACEMENT NOTE 10/28/2013  Patient:  [name], [name]  Account Number:  [id number] Admit date:  10/17/2013  Clinical Social Worker:  Megan Salon  Date/time:  10/28/2013 03:59 PM  Clinical Social Work is seeking post-discharge placement for this patient at the following level of care:   [place]   (*CSW will update this form in Epic as items are completed)   10/28/2013  Patient/family provided with [place] Department of Clinical Social Work's list of facilities offering this level of care within the geographic area requested by the patient (or if unable, by the patient's family).  10/28/2013  Patient/family informed of their freedom to choose among providers that offer the needed level of care, that participate in Medicare, Medicaid or managed care program needed by the patient, have an available bed and are willing to accept the patient.    Patient/family informed of [facility]' ownership interest in [facility], as well as of the fact that they are under no obligation to receive care at this facility.  PASARR submitted to EDS on 10/28/2013 PASARR number received from EDS on 10/28/2013  FL2 transmitted to all facilities in geographic area requested by pt/family on  10/28/2013 FL2 transmitted to all facilities within larger geographic area on   Patient informed that his/her managed care company has contracts with or will negotiate with  certain facilities, including the following:     Patient/family informed of bed offers received:   Patient chooses bed at  Physician recommends and patient chooses bed at    Patient to be transferred to  on   Patient to be transferred to facility by   The following physician request were entered in Epic:   Additional Comments:  Jeanette Caprice, MSW, [place]

## 2013-10-28 NOTE — Progress Notes (Addendum)
      [address]       [place] [address]             [phone]      7 Days Post-Op Procedure(s) (LRB): CORONARY ARTERY BYPASS GRAFTING (CABG) (N/A) INTRAOPERATIVE TRANSESOPHAGEAL ECHOCARDIOGRAM (N/A) CLIPPING OF ATRIAL APPENDAGE  Subjective:  [name] looks great this morning.  He has no complaints.  He is ambulating with assist of FWW.  He is hoping to be discharged today back to the [facility].  + BM  Objective: Vital signs in last 24 hours: Temp:  [98 F (36.7 C)-98.3 F (36.8 C)] 98 F (36.7 C) (01/14 0334) Pulse Rate:  [99-118] 99 (01/14 0334) Cardiac Rhythm:  [-] Atrial fibrillation (01/13 1956) Resp:  [16-20] 18 (01/14 0334) BP: (97-132)/(50-79) 119/63 mmHg (01/14 0334) SpO2:  [95 %-96 %] 95 % (01/14 0334) Weight:  [159 lb 2.8 oz (72.2 kg)] 159 lb 2.8 oz (72.2 kg) (01/14 0334)  Intake/Output from previous day: 01/13 0701 - 01/14 0700 In: 720 [P.O.:720] Out: 750 [Urine:750]  General appearance: alert, cooperative and no distress Heart: irregularly irregular rhythm Lungs: clear to auscultation bilaterally Abdomen: soft, non-tender; bowel sounds normal; no masses,  no organomegaly Extremities: edema 1+  Wound: clean and dry  Lab Results:  Recent Labs  10/26/13 0345 10/27/13 0458  WBC 7.7 7.0  HGB 9.3* 10.0*  HCT 26.4* 28.1*  PLT 265 323   BMET:  Recent Labs  10/26/13 0345 10/27/13 0458  NA 132* 130*  K 3.7 4.3  CL 93* 93*  CO2 25 25  GLUCOSE 96 93  BUN 29* 24*  CREATININE 0.98 0.84  CALCIUM 7.9* 8.2*    PT/INR:  Recent Labs  10/28/13 0410  LABPROT 22.8*  INR 2.09*   ABG    Component Value Date/Time   PHART 7.411 10/21/2013 2254   HCO3 25.3* 10/21/2013 2254   TCO2 21 10/22/2013 1559   ACIDBASEDEF 2.0 10/21/2013 1822   O2SAT 96.0 10/21/2013 2254   CBG (last 3)   Recent Labs  10/27/13 1115 10/27/13 1625 10/27/13 2147  GLUCAP 101* 102* 99    Assessment/Plan: S/P Procedure(s) (LRB): CORONARY ARTERY  BYPASS GRAFTING (CABG) (N/A) INTRAOPERATIVE TRANSESOPHAGEAL ECHOCARDIOGRAM (N/A) CLIPPING OF ATRIAL APPENDAGE  1. CV- remains in rate controlled Atrial Fibrillation- will continue Amiodarone, Lopressor, Digoxin, and Cardizem 2. INR 2.05 this morning, has been receiving 5 mg daily, will decrease to 3 mg per day 3. Pulm- no acute issues, continue IS 4. Renal- creatinine has been WNL, remains volume overloaded, down 2 lbs after Lasix yesterday, will continue 5. GI- no further diarrhea, continue to hold stool softners 6. CBGs controlled, patient no a diabetic, will d/c insulin and glucose checks 7. Dispo- patient doing well, remains in rate controlled A.Fib, likely cardioversion in the future, if okay with Dr. Servando Snare will plan to discharge in the AM   LOS: 11 days    [name], [name] 10/28/2013   To home tomorrow I have seen and examined [name] and agree with the above assessment  and plan.  Grace Isaac MD [facility] [phone] Office [phone] 10/28/2013 5:48 PM

## 2013-10-29 LAB — PROTIME-INR
INR: 2.81 — ABNORMAL HIGH (ref 0.00–1.49)
Prothrombin Time: 28.6 seconds — ABNORMAL HIGH (ref 11.6–15.2)

## 2013-10-29 MED ORDER — ASPIRIN 81 MG PO TBEC: 81.0000 mg | DELAYED_RELEASE_TABLET | Freq: Every day | ORAL | Status: DC

## 2013-10-29 MED ORDER — WARFARIN SODIUM 1 MG PO TABS
1.0000 mg | ORAL_TABLET | Freq: Once | ORAL | Status: AC
  Administered 2013-10-29: 1 mg via ORAL
  Filled 2013-10-29: qty 1

## 2013-10-29 MED ORDER — LISINOPRIL 2.5 MG PO TABS: 2.5000 mg | ORAL_TABLET | Freq: Every day | ORAL | Status: DC

## 2013-10-29 MED ORDER — ATORVASTATIN CALCIUM 10 MG PO TABS: 10.0000 mg | ORAL_TABLET | Freq: Every day | ORAL | Status: DC

## 2013-10-29 MED ORDER — POTASSIUM CHLORIDE CRYS ER 10 MEQ PO TBCR: 10.0000 meq | EXTENDED_RELEASE_TABLET | Freq: Every day | ORAL | Status: DC

## 2013-10-29 MED ORDER — METOPROLOL TARTRATE 25 MG PO TABS: 25.0000 mg | ORAL_TABLET | Freq: Two times a day (BID) | ORAL | Status: DC

## 2013-10-29 MED ORDER — DIGOXIN 125 MCG PO TABS: 0.1250 mg | ORAL_TABLET | Freq: Every day | ORAL | Status: DC

## 2013-10-29 MED ORDER — TRAMADOL HCL 50 MG PO TABS: 50.0000 mg | ORAL_TABLET | Freq: Four times a day (QID) | ORAL | Status: DC | PRN

## 2013-10-29 MED ORDER — WARFARIN SODIUM 2.5 MG PO TABS: 2.5000 mg | ORAL_TABLET | Freq: Every day | ORAL | Status: DC

## 2013-10-29 MED ORDER — FUROSEMIDE 20 MG PO TABS: 20.0000 mg | ORAL_TABLET | Freq: Every day | ORAL | Status: DC

## 2013-10-29 MED ORDER — DILTIAZEM HCL ER COATED BEADS 120 MG PO CP24: 120.0000 mg | ORAL_CAPSULE | Freq: Every day | ORAL | Status: DC

## 2013-10-29 MED ORDER — FUROSEMIDE 20 MG PO TABS
20.0000 mg | ORAL_TABLET | Freq: Every day | ORAL | Status: DC
Start: 2013-10-29 — End: 2013-10-29
  Filled 2013-10-29: qty 1

## 2013-10-29 MED ORDER — AMIODARONE HCL 200 MG PO TABS: 400.0000 mg | ORAL_TABLET | Freq: Two times a day (BID) | ORAL | Status: DC

## 2013-10-29 NOTE — Progress Notes (Signed)
[id number] Cardiac Rehab Completed discharge education with pt and wife. They voice understanding. Pt agrees to [name]. CRP in [place], will send referral. Deon Pilling, RN 10/29/2013 11:33 AM

## 2013-10-29 NOTE — Progress Notes (Signed)
Assessment unchanged. Discussed D/C instructions with pt and wife including f/u appointments and medication changes. Pt instructed on sternal precautions. Verbalized understanding. IV and tele removed.  Pt left via W/C accompanied by [facility] with belongings. Wife given packet and instructed to give to facility.

## 2013-10-29 NOTE — Progress Notes (Addendum)
      [address]       [place] [address]             [phone]        8 Days Post-Op Procedure(s) (LRB): CORONARY ARTERY BYPASS GRAFTING (CABG) (N/A) INTRAOPERATIVE TRANSESOPHAGEAL ECHOCARDIOGRAM (N/A) CLIPPING OF ATRIAL APPENDAGE  Subjective: Patient without complaints. Hopes to be discharged today.  Objective: Vital signs in last 24 hours: Temp:  [98.2 F (36.8 C)-98.4 F (36.9 C)] 98.3 F (36.8 C) (01/15 0350) Pulse Rate:  [84-107] 84 (01/15 0350) Cardiac Rhythm:  [-] Atrial fibrillation (01/14 2021) Resp:  [18] 18 (01/15 0350) BP: (102-128)/(57-77) 108/63 mmHg (01/15 0350) SpO2:  [94 %-98 %] 97 % (01/15 0350) Weight:  [70.943 kg (156 lb 6.4 oz)] 70.943 kg (156 lb 6.4 oz) (01/15 0350)  Pre op weight  66 kg Current Weight  10/29/13 70.943 kg (156 lb 6.4 oz)      Intake/Output from previous day: 01/14 0701 - 01/15 0700 In: 600 [P.O.:600] Out: 2150 [Urine:2150]   Physical Exam:  Cardiovascular: [facility] Pulmonary: Clear to auscultation on right and slightly diminished left base; no rales, wheezes, or rhonchi. Abdomen: Soft, non tender, bowel sounds present. Extremities: Trace bilateral lower extremity edema. Wounds: Clean and dry.  No erythema or signs of infection.  Lab Results: CBC: Recent Labs  10/27/13 0458  WBC 7.0  HGB 10.0*  HCT 28.1*  PLT 323   BMET:  Recent Labs  10/27/13 0458  NA 130*  K 4.3  CL 93*  CO2 25  GLUCOSE 93  BUN 24*  CREATININE 0.84  CALCIUM 8.2*    PT/INR:  Lab Results  Component Value Date   INR 2.81* 10/29/2013   INR 2.09* 10/28/2013   INR 1.55* 10/27/2013   ABG:  INR: Will add last result for INR, ABG once components are confirmed Will add last 4 CBG results once components are confirmed  Assessment/Plan:  1. CV - PAF. A fib this am with HR low 100's. On Amiodarone 400 bid, Digoxin 0.125 daily, Cardizem CD 120 daily, Lopressor 25 bid, Lisinopril 2.5 daily,and Coumadin. INR increased  from 20.9 to 2.81. Low dose Coumadin. Stop Lovenox. Will likely need cardioversion as outpatient. 2.  Pulmonary - Encourage incentive spirometer 3. Volume Overload - On Lasix 20 daily. Will need diuresis for a few days after discharge. 4.  Acute blood loss anemia - Last H and H 10 and 28.1. 5. Hopefully, discharge back to [place] MPA-C 10/29/2013,8:09 AM  [facility] I have seen and examined [name] and agree with the above assessment  and plan.  Grace Isaac MD [facility] [phone] Office [phone] 10/29/2013 8:51 AM

## 2013-10-29 NOTE — Progress Notes (Signed)
Pt ambulated 150 ft x1 assist with front wheel walker on RA. Pt tolerated ambulation with no complaints of pain or complications. Will continue to monitor.

## 2013-10-29 NOTE — Progress Notes (Signed)
Clinical Social Worker facilitated patient discharge by contacting the patient, family and facility, [facility]. Patient agreeable to this plan and arranging transport via patient's wife . CSW will sign off, as social work intervention is no longer needed.  Jeanette Caprice, MSW, [place]

## 2013-10-29 NOTE — Progress Notes (Signed)
Clinical Social Work Department CLINICAL SOCIAL WORK PLACEMENT NOTE 10/29/2013  Patient:  [name], [name]  Account Number:  [id number] Admit date:  10/17/2013  Clinical Social Worker:  Megan Salon  Date/time:  10/28/2013 03:59 PM  Clinical Social Work is seeking post-discharge placement for this patient at the following level of care:   [place]   (*CSW will update this form in Epic as items are completed)   10/28/2013  Patient/family provided with [place] Department of Clinical Social Work's list of facilities offering this level of care within the geographic area requested by the patient (or if unable, by the patient's family).  10/28/2013  Patient/family informed of their freedom to choose among providers that offer the needed level of care, that participate in Medicare, Medicaid or managed care program needed by the patient, have an available bed and are willing to accept the patient.    Patient/family informed of [facility]' ownership interest in [facility], as well as of the fact that they are under no obligation to receive care at this facility.  PASARR submitted to EDS on 10/28/2013 PASARR number received from EDS on 10/28/2013  FL2 transmitted to all facilities in geographic area requested by pt/family on  10/28/2013 FL2 transmitted to all facilities within larger geographic area on   Patient informed that his/her managed care company has contracts with or will negotiate with  certain facilities, including the following:     Patient/family informed of bed offers received:  10/29/2013 Patient chooses bed at [facility] Physician recommends and patient chooses bed at    Patient to be transferred to [facility] on  10/29/2013 Patient to be transferred to facility by FAMILY  The following physician request were entered in Epic:   Additional Comments:  Jeanette Caprice, MSW, [place]

## 2013-10-29 NOTE — Care Management Note (Signed)
    Page 1 of 1   10/29/2013     4:20:35 PM   CARE MANAGEMENT NOTE 10/29/2013  Patient:  [name], [name]   Account Number:  [id number]  Date Initiated:  10/20/2013  Documentation initiated by:  Elissa Hefty  Subjective/Objective Assessment:   adm w mi, for cabg on 1-7     Action/Plan:   lives w wife, indep pta   Anticipated DC Date:  10/29/2013   Anticipated DC Plan:  SKILLED NURSING FACILITY  In-house referral  Clinical Social Worker      DC Planning Services  CM consult      Choice offered to / List presented to:             Status of service:  Completed, signed off Medicare Important Message given?   (If response is "NO", the following Medicare IM given date fields will be blank) Date Medicare IM given:   Date Additional Medicare IM given:    Discharge Disposition:  [place]  Per UR Regulation:  Reviewed for med. necessity/level of care/duration of stay  If discussed at [place] of Stay Meetings, dates discussed:   10/22/2013  10/27/2013  10/29/2013    Comments:  PCP  Dr Lajean Manes  10/29/13 Sanjit Mcmichael,RN,BSN [phone] PT DISCHARGED TO SNF TODAY, PER CSW ARRANGEMENTS.  10/22/13 Vance RN MSN BSN CCM S/P CABG x 4 on 1/7, on amiodarone/dopamine gtts.  Spouse is visiting - she and pt live @ [facility] in independent living, he anticipates returning home when discharged.

## 2013-11-16 ENCOUNTER — Encounter: Payer: Medicare Other | Admitting: Cardiology

## 2013-11-24 ENCOUNTER — Other Ambulatory Visit: Payer: Self-pay | Admitting: *Deleted

## 2013-11-24 DIAGNOSIS — I2510 Atherosclerotic heart disease of native coronary artery without angina pectoris: Secondary | ICD-10-CM

## 2013-11-26 ENCOUNTER — Encounter: Payer: Self-pay | Admitting: Cardiothoracic Surgery

## 2013-11-26 ENCOUNTER — Ambulatory Visit (INDEPENDENT_AMBULATORY_CARE_PROVIDER_SITE_OTHER): Payer: Self-pay | Admitting: Cardiothoracic Surgery

## 2013-11-26 ENCOUNTER — Ambulatory Visit
Admission: RE | Admit: 2013-11-26 | Discharge: 2013-11-26 | Disposition: A | Discharge status: Home / Self Care | Payer: Medicare Other | Source: Ambulatory Visit | Attending: Cardiothoracic Surgery | Admitting: Cardiothoracic Surgery

## 2013-11-26 VITALS — BP 124/62 | HR 52 | Resp 20 | Ht 67.0 in | Wt 140.0 lb

## 2013-11-26 DIAGNOSIS — I2510 Atherosclerotic heart disease of native coronary artery without angina pectoris: Secondary | ICD-10-CM

## 2013-11-26 DIAGNOSIS — Z951 Presence of aortocoronary bypass graft: Secondary | ICD-10-CM

## 2013-11-26 NOTE — Progress Notes (Signed)
[address]       [place] [address]             [phone]                  [name] [place] Medical Record #[id number] Date of Birth: 08-Jun-1918  Primary cardiologists: Dr. Everett Graff, MD- primary care  Chief Complaint:   PostOp Follow Up Visit 10/21/2013  PREOPERATIVE DIAGNOSES: Recent non ST-elevation myocardial infarction  with critical left main obstruction and left ventricular dysfunction.  Ejection fraction approximately 30%. Atrial fibrillation.  POSTOPERATIVE DIAGNOSIS: Recent non ST-elevation myocardial infarction  with critical left main obstruction and left ventricular dysfunction.  Ejection fraction approximately 30% with incidental enlarged mammary  chain lymph node.  PROCEDURE PERFORMED: Coronary artery bypass grafting x4 with the left  internal mammary to the left anterior descending coronary artery,  sequential reverse saphenous vein graft to the obtuse marginal and  circumflex coronary artery, reverse saphenous vein graft to the distal  right coronary artery and placement of atrial clip, incidental biopsy,  mammary chain lymph node, and bilateral thigh endo vein harvesting.  SURGEON: Lanelle Bal, MD   History of Present Illness:      Patient 80 years old had recent coronary artery bypass grafting and placement of left atrial clip with bilateral thigh endo- vein harvesting. Considering his age he seems to be making reasonable progress. He does complain of poor appetite and some difficulty sleeping. He denies any incisional pain. He has no pedal edema. He continues on Coumadin and has had his protime checked frequently since discharge. He has noticed no episodes of rapid heartbeat.   History  Smoking status  . Former Smoker  Smokeless tobacco  . Not on file    Comment: During WWII       No Known Allergies  Current Outpatient Prescriptions  Medication Sig Dispense Refill  . amiodarone  (PACERONE) 200 MG tablet Take 200 mg by mouth daily.      [name] aspirin EC 81 MG EC tablet Take 1 tablet (81 mg total) by mouth daily.      [name] atorvastatin (LIPITOR) 10 MG tablet Take 1 tablet (10 mg total) by mouth daily at 6 PM.  30 tablet  1  . digoxin (LANOXIN) 0.125 MG tablet Take 1 tablet (0.125 mg total) by mouth daily.  30 tablet  1  . diltiazem (CARDIZEM CD) 240 MG 24 hr capsule Take 240 mg by mouth daily.      [name] lisinopril (PRINIVIL,ZESTRIL) 2.5 MG tablet Take 1 tablet (2.5 mg total) by mouth daily.  30 tablet  1  . metoprolol tartrate (LOPRESSOR) 25 MG tablet Take 1 tablet (25 mg total) by mouth 2 (two) times daily.  60 tablet  1  . Multiple Vitamin (MULTIVITAMIN) tablet Take 1 tablet by mouth daily.        [name] warfarin (COUMADIN) 2.5 MG tablet Take 2 mg by mouth daily at 6 PM. Or as directed.       No current facility-administered medications for this visit.       Physical Exam: BP 124/62  Pulse 52  Resp 20  Ht 5\' 7"  (1.702 m)  Wt 140 lb (63.504 kg)  BMI 21.92 kg/m2  SpO2 96% On exam continues to be in atrial fibrillation General appearance: alert, cooperative and no distress Neurologic: intact Heart: irregularly irregular rhythm Lungs: diminished breath sounds LLL Abdomen: soft, non-tender; bowel sounds normal; no masses,  no organomegaly Extremities: extremities normal, atraumatic, no cyanosis or edema and Homans sign is negative, no sign of DVT Wound: His sternum is stable and well healed he does have a small lymphocele at the left knee end of vein harvest site but does not appear infected.   Diagnostic Studies & Laboratory data:         Recent Radiology Findings: Dg Chest 2 View  11/26/2013   CLINICAL DATA:  Status post coronary artery bypass grafting, cough  EXAM: CHEST  2 VIEW  COMPARISON:  DG CHEST 1V PORT dated 10/26/2013; DG CHEST 2V dated 07/09/2012  FINDINGS: The patient is status post median sternotomy, coronary artery bypass grafting, and atrial appendage  clipping. The cardiac silhouette is moderately enlarged. Stable calcified pleural plaques are appreciated within the right and left hemithoraces. A small pleural effusion is appreciated within the left lung base. An area of increased density projects in the left retrocardiac region within more consolidative appearance. No focal regions of consolidation or focal infiltrates appreciated in the right hemithorax. There is trace blunting of the right costophrenic angle. The osseous structures are unremarkable.  IMPRESSION: Small left pleural effusion. A concomitant area of atelectasis versus infiltrate within the left lung base is appreciated.  Stable bilateral calcified pleural plaques. There has been near complete resolution of the diffuse opacities within the right hemi thorax. A trace right effusion versus scarring in the right costophrenic angle is a diagnostic consideration. Continued surveillance evaluation recommended.  The diffuse interstitial infiltrates have also decreased within the right left hemithoraces consistent with improved pulmonary edema. The the residual areas of increased density appear to be similar to the prior chest radiograph dated 06/08/2012 and likely represent chronic changes.   Electronically Signed   By: Margaree Mackintosh M.D.   On: 11/26/2013 10:16      Recent Labs: Lab Results  Component Value Date   WBC 7.0 10/27/2013   HGB 10.0* 10/27/2013   HCT 28.1* 10/27/2013   PLT 323 10/27/2013   GLUCOSE 93 10/27/2013   ALT 11 10/18/2013   AST 18 10/18/2013   NA 130* 10/27/2013   K 4.3 10/27/2013   CL 93* 10/27/2013   CREATININE 0.84 10/27/2013   BUN 24* 10/27/2013   CO2 25 10/27/2013   TSH 1.000 10/22/2013   INR 2.81* 10/29/2013   HGBA1C 6.0* 10/20/2013      Assessment / Plan:     Patient is status post coronary artery bypass grafting in for his age appears to be doing reasonably well. He was encouraged to increase his by mouth intake and/or use dietary supplements to increase his caloric  intake. He will return in 3 weeks with a followup chest x-ray, and we will evaluate the small lymphocele in the left knee Has an appointment with cardiology early next week.  [name] 11/26/2013 11:18 AM

## 2013-11-30 ENCOUNTER — Ambulatory Visit (INDEPENDENT_AMBULATORY_CARE_PROVIDER_SITE_OTHER): Payer: Medicare Other | Admitting: Cardiology

## 2013-11-30 ENCOUNTER — Encounter: Payer: Self-pay | Admitting: Cardiology

## 2013-11-30 VITALS — BP 128/60 | HR 51 | Ht 67.0 in | Wt 142.0 lb

## 2013-11-30 DIAGNOSIS — I10 Essential (primary) hypertension: Secondary | ICD-10-CM

## 2013-11-30 DIAGNOSIS — I2510 Atherosclerotic heart disease of native coronary artery without angina pectoris: Secondary | ICD-10-CM

## 2013-11-30 DIAGNOSIS — I2589 Other forms of chronic ischemic heart disease: Secondary | ICD-10-CM

## 2013-11-30 DIAGNOSIS — I255 Ischemic cardiomyopathy: Secondary | ICD-10-CM

## 2013-11-30 DIAGNOSIS — I4891 Unspecified atrial fibrillation: Secondary | ICD-10-CM

## 2013-11-30 NOTE — Assessment & Plan Note (Addendum)
Patient remains in atrial fibrillation. In reviewing electrocardiograms he was in atrial fibrillation in 2011. He is unclear whether his atrial fibrillation has been persistent since then or only recently recurred. I will obtain outside electrocardiograms for review. His rate was difficult to control in the hospital but he is now bradycardic. Discontinue digoxin. Continue Cardizem, beta blocker and amiodarone. Check 24-hour Holter monitor in 2 weeks to further assess rate. Continue Coumadin. I have given the names of NOAC agents. He will check with his insurance company and we will change if one is covered. Note we discussed possible attempt at cardioversion. He is not interested in pursuing that. We will continue with rate control and anticoagulation as he is now asymptomatic.

## 2013-11-30 NOTE — Assessment & Plan Note (Signed)
Continue aspirin and statin. 

## 2013-11-30 NOTE — Assessment & Plan Note (Signed)
Blood pressure controlled. Continue present medications. 

## 2013-11-30 NOTE — Patient Instructions (Signed)
Your physician recommends that you schedule a follow-up appointment in: [place] physician has recommended that you wear a 24 HOUR holter monitor. Holter monitors are medical devices that record the heart's electrical activity. Doctors most often use these monitors to diagnose arrhythmias. Arrhythmias are problems with the speed or rhythm of the heartbeat. The monitor is a small, portable device. You can wear one while you do your normal daily activities. This is usually used to diagnose what is causing palpitations/syncope (passing out).SCHEDULE IN [place] TO REPLACE WARFARIN

## 2013-11-30 NOTE — Assessment & Plan Note (Signed)
Continue ACE inhibitor and beta blocker. Repeat echocardiogram in approximately 3 months. Cardiomyopathy may have been related to ischemia versus atrial fibrillation with a rapid ventricular response. Hopefully it will have improved following revascularization and improved rate control.

## 2013-11-30 NOTE — Progress Notes (Signed)
HPI: 80 year old male for followup of coronary artery disease and atrial fibrillation. Patient recently admitted with congestive heart failure. Echocardiogram in January of 2015 showed an ejection fraction of 35-40%. There was trace aortic insufficiency, moderate mitral regurgitation and moderate to severe left atrial enlargement. He underwent cardiac catheterization in January of 2015 which revealed severe coronary artery disease including an 80-90% distal left main. Ejection fraction was 35%. Preoperative carotid Dopplers in January of 2015 showed 1-39% bilateral stenosis. The patient underwent coronary artery bypassing graft with a LIMA to the LAD, saphenous vein graft to the obtuse marginal and circumflex and saphenous vein graft to the right coronary artery. He also had placement of atrial clip. He did have atrial fibrillation postoperatively which was treated with amiodarone. Since he was discharged, he denies dyspnea, chest pain, palpitations or syncope.  Current Outpatient Prescriptions  Medication Sig Dispense Refill  . amiodarone (PACERONE) 200 MG tablet Take 200 mg by mouth daily.      [name] aspirin EC 81 MG EC tablet Take 1 tablet (81 mg total) by mouth daily.      [name] atorvastatin (LIPITOR) 10 MG tablet Take 1 tablet (10 mg total) by mouth daily at 6 PM.  30 tablet  1  . digoxin (LANOXIN) 0.125 MG tablet Take 1 tablet (0.125 mg total) by mouth daily.  30 tablet  1  . diltiazem (CARDIZEM CD) 240 MG 24 hr capsule Take 240 mg by mouth daily.      Mariane Baumgarten Calcium (STOOL SOFTENER PO) Take by mouth daily.      [name] lisinopril (PRINIVIL,ZESTRIL) 2.5 MG tablet Take 1 tablet (2.5 mg total) by mouth daily.  30 tablet  1  . metoprolol tartrate (LOPRESSOR) 25 MG tablet Take 1 tablet (25 mg total) by mouth 2 (two) times daily.  60 tablet  1  . Multiple Vitamin (MULTIVITAMIN) tablet Take 1 tablet by mouth daily.        [name] warfarin (COUMADIN) 2 MG tablet Take as directed by coumadin clinic       No  current facility-administered medications for this visit.     Past Medical History  Diagnosis Date  . Small bowel obstruction   . Atrial fibrillation     with rapid ventricular repsonse  . HTN (hypertension)   . Bone lesion     lytic  . Debility   . S/P CABG x 4 with atrial clip 10/21/2013    Past Surgical History  Procedure Laterality Date  . Appendectomy    . Hernia repair      x2  . Parathyroidectomy    . Coronary artery bypass graft N/A 10/21/2013    Procedure: CORONARY ARTERY BYPASS GRAFTING (CABG);  Surgeon: Grace Isaac, MD;  Location: [facility];  Service: Open Heart Surgery;  Laterality: N/A;  . Intraoperative transesophageal echocardiogram N/A 10/21/2013    Procedure: INTRAOPERATIVE TRANSESOPHAGEAL ECHOCARDIOGRAM;  Surgeon: Grace Isaac, MD;  Location: [facility];  Service: Open Heart Surgery;  Laterality: N/A;  . Clipping of atrial appendage  10/21/2013    Procedure: CLIPPING OF ATRIAL APPENDAGE;  Surgeon: Grace Isaac, MD;  Location: [facility];  Service: Open Heart Surgery;;    History   Social History  . Marital Status: Married    Spouse Name: N/A    Number of Children: N/A  . Years of Education: N/A   Occupational History  . retired    Social History Main Topics  . Smoking status: Former Research scientist (life sciences)  .  Smokeless tobacco: Not on file     Comment: During WWII  . Alcohol Use: 4.2 oz/week    7 Glasses of wine per week  . Drug Use: No  . Sexual Activity: Not on file   Other Topics Concern  . Not on file   Social History Narrative   Retired from the railroad.  One child    ROS: some weakness but no fevers or chills, productive cough, hemoptysis, dysphasia, odynophagia, melena, hematochezia, dysuria, hematuria, rash, seizure activity, orthopnea, PND, pedal edema, claudication. Remaining systems are negative.  Physical Exam: Well-developed well-nourished in no acute distress.  Skin is warm and dry.  HEENT is normal.  Neck is supple.  Chest is clear to  auscultation with normal expansion, status post sternotomy Cardiovascular exam is bradycardic and irregular Abdominal exam nontender or distended. No masses palpated. Extremities show trace edema. Lymphocele palpated left lower extremity neuro grossly intact  ECG

## 2013-12-10 ENCOUNTER — Ambulatory Visit (HOSPITAL_COMMUNITY): Payer: Medicare Other

## 2013-12-14 ENCOUNTER — Encounter (HOSPITAL_COMMUNITY): Payer: Medicare Other

## 2013-12-14 ENCOUNTER — Other Ambulatory Visit: Payer: Self-pay | Admitting: *Deleted

## 2013-12-14 DIAGNOSIS — I2510 Atherosclerotic heart disease of native coronary artery without angina pectoris: Secondary | ICD-10-CM

## 2013-12-16 ENCOUNTER — Encounter (HOSPITAL_COMMUNITY): Payer: Medicare Other

## 2013-12-17 ENCOUNTER — Encounter: Payer: Self-pay | Admitting: Cardiothoracic Surgery

## 2013-12-17 ENCOUNTER — Ambulatory Visit
Admission: RE | Admit: 2013-12-17 | Discharge: 2013-12-17 | Disposition: A | Discharge status: Home / Self Care | Payer: Medicare Other | Source: Ambulatory Visit | Attending: Cardiothoracic Surgery | Admitting: Cardiothoracic Surgery

## 2013-12-17 ENCOUNTER — Encounter (HOSPITAL_COMMUNITY)
Admission: RE | Admit: 2013-12-17 | Discharge: 2013-12-17 | Disposition: A | Discharge status: Home / Self Care | Payer: Medicare Other | Source: Ambulatory Visit | Attending: Cardiology | Admitting: Cardiology

## 2013-12-17 ENCOUNTER — Ambulatory Visit (INDEPENDENT_AMBULATORY_CARE_PROVIDER_SITE_OTHER): Payer: Self-pay | Admitting: Cardiothoracic Surgery

## 2013-12-17 VITALS — BP 118/64 | HR 64 | Resp 16 | Ht 64.0 in | Wt 144.0 lb

## 2013-12-17 DIAGNOSIS — Z5189 Encounter for other specified aftercare: Secondary | ICD-10-CM | POA: Insufficient documentation

## 2013-12-17 DIAGNOSIS — I4891 Unspecified atrial fibrillation: Secondary | ICD-10-CM | POA: Insufficient documentation

## 2013-12-17 DIAGNOSIS — I252 Old myocardial infarction: Secondary | ICD-10-CM | POA: Insufficient documentation

## 2013-12-17 DIAGNOSIS — Z951 Presence of aortocoronary bypass graft: Secondary | ICD-10-CM | POA: Insufficient documentation

## 2013-12-17 DIAGNOSIS — I2510 Atherosclerotic heart disease of native coronary artery without angina pectoris: Secondary | ICD-10-CM

## 2013-12-17 DIAGNOSIS — I898 Other specified noninfective disorders of lymphatic vessels and lymph nodes: Secondary | ICD-10-CM

## 2013-12-17 NOTE — Progress Notes (Signed)
[address]       [place] [address]             [phone]                     Kabe C Ma [place] Medical Record #[id number] Date of Birth: May 15, 1918  Primary cardiologists: Dr. Everett Graff, MD- primary care  Chief Complaint:   PostOp Follow Up Visit 10/21/2013  PREOPERATIVE DIAGNOSES: Recent non ST-elevation myocardial infarction  with critical left main obstruction and left ventricular dysfunction.  Ejection fraction approximately 30%. Atrial fibrillation.  POSTOPERATIVE DIAGNOSIS: Recent non ST-elevation myocardial infarction  with critical left main obstruction and left ventricular dysfunction.  Ejection fraction approximately 30% with incidental enlarged mammary  chain lymph node.  PROCEDURE PERFORMED: Coronary artery bypass grafting x4 with the left  internal mammary to the left anterior descending coronary artery,  sequential reverse saphenous vein graft to the obtuse marginal and  circumflex coronary artery, reverse saphenous vein graft to the distal  right coronary artery and placement of atrial clip, incidental biopsy,  mammary chain lymph node, and bilateral thigh endo vein harvesting.  SURGEON: Lanelle Bal, MD   History of Present Illness:      Patient 80 years old had recent coronary artery bypass grafting and placement of left atrial clip with bilateral thigh endo- vein harvesting.  He denies any incisional pain. He has no pedal edema. He continues on Coumadin and has had his protime to be checked on Monday discharge. He has noticed no episodes of rapid heartbeat. He went to orientation in cardiac rehabilitation this morning, and plans to start next week   History  Smoking status  . Former Smoker  Smokeless tobacco  . Not on file    Comment: During WWII       No Known Allergies  Current Outpatient Prescriptions  Medication Sig Dispense Refill  . amiodarone (PACERONE) 200 MG tablet Take 200 mg  by mouth daily.      [name] aspirin EC 81 MG EC tablet Take 1 tablet (81 mg total) by mouth daily.      [name] atorvastatin (LIPITOR) 10 MG tablet Take 1 tablet (10 mg total) by mouth daily at 6 PM.  30 tablet  1  . diltiazem (CARDIZEM CD) 240 MG 24 hr capsule Take 240 mg by mouth daily.      [name] lisinopril (PRINIVIL,ZESTRIL) 2.5 MG tablet Take 1 tablet (2.5 mg total) by mouth daily.  30 tablet  1  . metoprolol tartrate (LOPRESSOR) 25 MG tablet Take 1 tablet (25 mg total) by mouth 2 (two) times daily.  60 tablet  1  . Multiple Vitamin (MULTIVITAMIN) tablet Take 1 tablet by mouth daily.        [name] warfarin (COUMADIN) 2 MG tablet Take 2 mg by mouth daily. OR AS DIRECTED BY COUMADIN CLINIC       No current facility-administered medications for this visit.       Physical Exam: BP 118/64  Pulse 64  Resp 16  Ht 5\' 4"  (1.626 m)  Wt 144 lb (65.318 kg)  BMI 24.71 kg/m2  SpO2 98% On exam continues to be in atrial fibrillation General appearance: alert, cooperative and no distress Neurologic: intact Heart: irregularly irregular rhythm Lungs: diminished breath sounds LLL Abdomen: soft, non-tender; bowel sounds normal; no masses,  no organomegaly Extremities: extremities normal, atraumatic, no cyanosis or edema and Homans sign is negative, no sign of DVT  Wound: His sternum is stable and well healed he does have a small lymphocele at the left knee end of vein harvest site but does not appear infected.  Today in the office we locally prepped the left knee lymphocele along the vein harvest site and aspirated 10 mL of straw-colored lymph fluid. Dressing was applied  Diagnostic Studies & Laboratory data:         Recent Radiology Findings: Dg Chest 2 View  12/17/2013   CLINICAL DATA:  Coronary artery disease  EXAM: CHEST  2 VIEW  COMPARISON:  11/26/2013  FINDINGS: Prior open heart surgery and CABG. Left pleural effusion unchanged. Left lower lobe atelectasis unchanged.  Extensive pleural calcified plaque  bilaterally, unchanged. This may be related to prior asbestos exposure. Negative for mass or heart failure.  IMPRESSION: Left pleural effusion is unchanged. Left lower lobe atelectasis unchanged  Extensive calcified pleural plaques bilaterally.   Electronically Signed   By: Franchot Gallo M.D.   On: 12/17/2013 15:52      Recent Labs: Lab Results  Component Value Date   WBC 7.0 10/27/2013   HGB 10.0* 10/27/2013   HCT 28.1* 10/27/2013   PLT 323 10/27/2013   GLUCOSE 93 10/27/2013   ALT 11 10/18/2013   AST 18 10/18/2013   NA 130* 10/27/2013   K 4.3 10/27/2013   CL 93* 10/27/2013   CREATININE 0.84 10/27/2013   BUN 24* 10/27/2013   CO2 25 10/27/2013   TSH 1.000 10/22/2013   INR 2.81* 10/29/2013   HGBA1C 6.0* 10/20/2013      Assessment / Plan:     Patient is status post coronary artery bypass grafting in for his age appears to be doing reasonably well. He was encouraged to increase his by mouth intake and/or use dietary supplements to increase his caloric intake. He will return in 3 weeks to check his left knee wound/lymphocele    Jerah Esty B 12/17/2013 4:37 PM

## 2013-12-17 NOTE — Progress Notes (Signed)
Cardiac Rehab Medication Review by a Pharmacist  Does the patient  feel that his/her medications are working for him/her?  {YES   Has the patient been experiencing any side effects to the medications prescribed?   NO  Does the patient measure his/her own blood pressure or blood glucose at home?  {YES    Does the patient have any problems obtaining medications due to transportation or finances?    NO  Understanding of regimen: good Understanding of indications: good Potential of compliance: good    Pharmacist comments: Overall Pt is doing well with his medications, and takes nothing OTC.  His wife does help him with his medications.    Jeronimo Norma 12/17/2013 8:59 AM

## 2013-12-17 NOTE — Patient Instructions (Signed)
Keep dressing on two days Continue to rehab

## 2013-12-18 ENCOUNTER — Encounter (HOSPITAL_COMMUNITY): Payer: Medicare Other

## 2013-12-21 ENCOUNTER — Encounter (HOSPITAL_COMMUNITY): Payer: Medicare Other

## 2013-12-23 ENCOUNTER — Telehealth: Payer: Self-pay | Admitting: Cardiology

## 2013-12-23 ENCOUNTER — Encounter (HOSPITAL_COMMUNITY)
Admission: RE | Admit: 2013-12-23 | Discharge: 2013-12-23 | Disposition: A | Discharge status: Home / Self Care | Payer: Medicare Other | Source: Ambulatory Visit | Attending: Cardiology | Admitting: Cardiology

## 2013-12-23 MED ORDER — LOSARTAN POTASSIUM 25 MG PO TABS: 25.0000 mg | ORAL_TABLET | Freq: Every day | ORAL | Status: DC

## 2013-12-23 NOTE — Telephone Encounter (Signed)
DC lisinopril; cozaar 25 mg daily Kirk Ruths

## 2013-12-23 NOTE — Progress Notes (Signed)
Pt started cardiac rehab today.  Pt tolerated light exercise without difficulty. Telemetry rhythm Atrial fibrillation which is chronic. Vital signs stable. Patient wife reported that Mr [name] has had a cough his lisinopril was changed to losartan.Will continue to monitor the patient throughout  the program.

## 2013-12-23 NOTE — Telephone Encounter (Signed)
Left message for pt of dr Jacalyn Lefevre recommendations. He is to call with questions or problems.

## 2013-12-23 NOTE — Telephone Encounter (Signed)
Pt on new med since operation on jan 7th , has been coughing since, pls call

## 2013-12-23 NOTE — Telephone Encounter (Signed)
Spoke with pt, probably due to lisinopril. Will forward for dr Stanford Breed review

## 2013-12-25 ENCOUNTER — Encounter (HOSPITAL_COMMUNITY)
Admission: RE | Admit: 2013-12-25 | Discharge: 2013-12-25 | Disposition: A | Discharge status: Home / Self Care | Payer: Medicare Other | Source: Ambulatory Visit | Attending: Cardiology | Admitting: Cardiology

## 2013-12-28 ENCOUNTER — Telehealth: Payer: Self-pay | Admitting: Cardiology

## 2013-12-28 ENCOUNTER — Encounter (HOSPITAL_COMMUNITY): Payer: Medicare Other

## 2013-12-28 DIAGNOSIS — R0602 Shortness of breath: Secondary | ICD-10-CM

## 2013-12-28 MED ORDER — FUROSEMIDE 20 MG PO TABS
20.0000 mg | ORAL_TABLET | Freq: Every day | ORAL | Status: DC
Start: 2013-12-28 — End: 2014-01-12

## 2013-12-28 NOTE — Telephone Encounter (Signed)
New Message:  Pt's wife states he just Losartan and she believes it is causing him restlessness and SOB... Pt's wife is sitting next to her husband and he ic c/o SOB now... Pt would like to speak to the nurse.

## 2013-12-28 NOTE — Telephone Encounter (Signed)
Spoke with [name] and his wife and gave them instructions from Dr. Stanford Breed. Will send prescription to [facility] on [address].  [name] will come in for lab work on January 04, 2014.  He will let us know if shortness of breath continues.

## 2013-12-28 NOTE — Telephone Encounter (Signed)
Spoke with pt's wife who reports pt was recently changed to Losartan due to cough on Lisinopril. Has been taking new medication for 3 days. Wife reports pt just got up and is sitting at kitchen table and told her he is having some shortness of breath.  Wife reports pt was doing well yesterday but did not sleep well last night or Saturday night. He was doing well yesterday. Pt still with cough. Weight 144 at rehab on Friday. No weight since. Wife reports some swelling in feet and ankles but this does not appear to have increased. Will review with Dr. Stanford Breed

## 2013-12-28 NOTE — Telephone Encounter (Signed)
Lasix 20 mg daily; bmet and bnp one week Kirk Ruths

## 2013-12-30 ENCOUNTER — Encounter (HOSPITAL_COMMUNITY)
Admission: RE | Admit: 2013-12-30 | Discharge: 2013-12-30 | Disposition: A | Discharge status: Home / Self Care | Payer: Medicare Other | Source: Ambulatory Visit | Attending: Cardiology | Admitting: Cardiology

## 2014-01-01 ENCOUNTER — Encounter (HOSPITAL_COMMUNITY)
Admission: RE | Admit: 2014-01-01 | Discharge: 2014-01-01 | Disposition: A | Discharge status: Home / Self Care | Payer: Medicare Other | Source: Ambulatory Visit | Attending: Cardiology | Admitting: Cardiology

## 2014-01-04 ENCOUNTER — Encounter (HOSPITAL_COMMUNITY)
Admission: RE | Admit: 2014-01-04 | Discharge: 2014-01-04 | Disposition: A | Discharge status: Home / Self Care | Payer: Medicare Other | Source: Ambulatory Visit | Attending: Cardiology | Admitting: Cardiology

## 2014-01-04 ENCOUNTER — Other Ambulatory Visit (INDEPENDENT_AMBULATORY_CARE_PROVIDER_SITE_OTHER): Payer: Medicare Other

## 2014-01-04 DIAGNOSIS — R0602 Shortness of breath: Secondary | ICD-10-CM

## 2014-01-04 LAB — BRAIN NATRIURETIC PEPTIDE: PRO B NATRI PEPTIDE: 603 pg/mL — AB (ref 0.0–100.0)

## 2014-01-04 LAB — BASIC METABOLIC PANEL
BUN: 18 mg/dL (ref 6–23)
CHLORIDE: 104 meq/L (ref 96–112)
CO2: 28 mEq/L (ref 19–32)
Calcium: 8.9 mg/dL (ref 8.4–10.5)
Creatinine, Ser: 1.1 mg/dL (ref 0.4–1.5)
GFR: 66.35 mL/min (ref >60.00)
Glucose, Bld: 113 mg/dL — ABNORMAL HIGH (ref 70–99)
Potassium: 4 mEq/L (ref 3.5–5.1)
Sodium: 137 mEq/L (ref 135–145)

## 2014-01-04 NOTE — Progress Notes (Signed)
Reviewed home exercise with pt today.  Pt plans to continue walking and doing isometric exercises at home for exercise.  Reviewed THR, pulse, RPE, sign and symptoms, and when to call 911 or MD.  Pt voiced understanding. Alberteen Sam, MA, ACSM RCEP

## 2014-01-06 ENCOUNTER — Encounter (HOSPITAL_COMMUNITY)
Admission: RE | Admit: 2014-01-06 | Discharge: 2014-01-06 | Disposition: A | Discharge status: Home / Self Care | Payer: Medicare Other | Source: Ambulatory Visit | Attending: Cardiology | Admitting: Cardiology

## 2014-01-06 ENCOUNTER — Other Ambulatory Visit: Payer: Self-pay | Admitting: *Deleted

## 2014-01-06 DIAGNOSIS — I255 Ischemic cardiomyopathy: Secondary | ICD-10-CM

## 2014-01-08 ENCOUNTER — Encounter (INDEPENDENT_AMBULATORY_CARE_PROVIDER_SITE_OTHER): Payer: Medicare Other

## 2014-01-08 ENCOUNTER — Encounter: Payer: Self-pay | Admitting: Radiology

## 2014-01-08 ENCOUNTER — Encounter (HOSPITAL_COMMUNITY)
Admission: RE | Admit: 2014-01-08 | Discharge: 2014-01-08 | Disposition: A | Discharge status: Home / Self Care | Payer: Medicare Other | Source: Ambulatory Visit | Attending: Cardiology | Admitting: Cardiology

## 2014-01-08 DIAGNOSIS — I4891 Unspecified atrial fibrillation: Secondary | ICD-10-CM

## 2014-01-08 NOTE — Progress Notes (Signed)
Patient ID: [name], male   DOB: December 06, 1917, 80 y.o.   MRN: [id number] E Cardio 24hr holter monitor applied

## 2014-01-11 ENCOUNTER — Encounter (HOSPITAL_COMMUNITY)
Admission: RE | Admit: 2014-01-11 | Discharge: 2014-01-11 | Disposition: A | Discharge status: Home / Self Care | Payer: Medicare Other | Source: Ambulatory Visit | Attending: Cardiology | Admitting: Cardiology

## 2014-01-12 ENCOUNTER — Other Ambulatory Visit: Payer: Self-pay | Admitting: *Deleted

## 2014-01-12 MED ORDER — FUROSEMIDE 20 MG PO TABS: 40.0000 mg | ORAL_TABLET | Freq: Every day | ORAL | Status: DC

## 2014-01-13 ENCOUNTER — Encounter (HOSPITAL_COMMUNITY)
Admission: RE | Admit: 2014-01-13 | Discharge: 2014-01-13 | Disposition: A | Discharge status: Home / Self Care | Payer: Medicare Other | Source: Ambulatory Visit | Attending: Cardiology | Admitting: Cardiology

## 2014-01-13 DIAGNOSIS — Z951 Presence of aortocoronary bypass graft: Secondary | ICD-10-CM | POA: Insufficient documentation

## 2014-01-13 DIAGNOSIS — I252 Old myocardial infarction: Secondary | ICD-10-CM | POA: Insufficient documentation

## 2014-01-13 DIAGNOSIS — Z5189 Encounter for other specified aftercare: Secondary | ICD-10-CM | POA: Insufficient documentation

## 2014-01-13 DIAGNOSIS — I4891 Unspecified atrial fibrillation: Secondary | ICD-10-CM | POA: Insufficient documentation

## 2014-01-14 ENCOUNTER — Ambulatory Visit (INDEPENDENT_AMBULATORY_CARE_PROVIDER_SITE_OTHER): Payer: Self-pay | Admitting: Cardiothoracic Surgery

## 2014-01-14 ENCOUNTER — Other Ambulatory Visit: Payer: Self-pay | Admitting: *Deleted

## 2014-01-14 ENCOUNTER — Ambulatory Visit
Admission: RE | Admit: 2014-01-14 | Discharge: 2014-01-14 | Disposition: A | Discharge status: Home / Self Care | Payer: Medicare Other | Source: Ambulatory Visit | Attending: Cardiothoracic Surgery | Admitting: Cardiothoracic Surgery

## 2014-01-14 ENCOUNTER — Encounter: Payer: Self-pay | Admitting: Cardiothoracic Surgery

## 2014-01-14 VITALS — BP 100/49 | HR 52 | Resp 16 | Ht 64.0 in | Wt 147.5 lb

## 2014-01-14 DIAGNOSIS — I2510 Atherosclerotic heart disease of native coronary artery without angina pectoris: Secondary | ICD-10-CM

## 2014-01-14 DIAGNOSIS — Z951 Presence of aortocoronary bypass graft: Secondary | ICD-10-CM

## 2014-01-14 DIAGNOSIS — I898 Other specified noninfective disorders of lymphatic vessels and lymph nodes: Secondary | ICD-10-CM

## 2014-01-14 DIAGNOSIS — T8189XA Other complications of procedures, not elsewhere classified, initial encounter: Secondary | ICD-10-CM

## 2014-01-14 NOTE — Progress Notes (Signed)
[facility] Record #[id number] Date of Birth: [birth year]  Primary cardiologists: Dr. Everett Graff, MD- primary care  Chief Complaint:   PostOp Follow Up Visit 10/21/2013  PREOPERATIVE DIAGNOSES: Recent non ST-elevation myocardial infarction  with critical left main obstruction and left ventricular dysfunction.  Ejection fraction approximately 30%. Atrial fibrillation.  POSTOPERATIVE DIAGNOSIS: Recent non ST-elevation myocardial infarction  with critical left main obstruction and left ventricular dysfunction.  Ejection fraction approximately 30% with incidental enlarged mammary  chain lymph node.  PROCEDURE PERFORMED: Coronary artery bypass grafting x4 with the left  internal mammary to the left anterior descending coronary artery,  sequential reverse saphenous vein graft to the obtuse marginal and  circumflex coronary artery, reverse saphenous vein graft to the distal  right coronary artery and placement of atrial clip, incidental biopsy,  mammary chain lymph node, and bilateral thigh endo vein harvesting.  SURGEON: Lanelle Bal, MD   History of Present Illness:      Patient 80 years old had recent coronary artery bypass grafting and placement of left atrial clip with bilateral thigh endo- vein harvesting.  He denies any incisional pain. He has no pedal edema. He continues on Coumadin and has had his protime to be checked on Monday discharge. He has noticed no episodes of rapid heartbeat. He is started in cardiac rehabilitation and making progress. His major complaint now is weakness in his legs when he walks a lot. Recently he was noted to have increasing edema in his legs cardiology started him on Lasix daily. BNP was slightly over 600. Patient notes that primary care started him on iron supplements.  History  Smoking status  . Former Smoker  Smokeless tobacco  . Not on file    Comment: During WWII       No Known  Allergies  Current Outpatient Prescriptions  Medication Sig Dispense Refill  . amiodarone (PACERONE) 200 MG tablet Take 200 mg by mouth daily.      [name] aspirin EC 81 MG EC tablet Take 1 tablet (81 mg total) by mouth daily.      [name] atorvastatin (LIPITOR) 10 MG tablet Take 1 tablet (10 mg total) by mouth daily at 6 PM.  30 tablet  1  . diltiazem (CARDIZEM CD) 240 MG 24 hr capsule Take 240 mg by mouth daily.      . ferrous sulfate 325 (65 FE) MG tablet Take 325 mg by mouth daily with breakfast.      . furosemide (LASIX) 20 MG tablet Take 2 tablets (40 mg total) by mouth daily.  60 tablet  12  . losartan (COZAAR) 25 MG tablet Take 1 tablet (25 mg total) by mouth daily.  90 tablet  3  . metoprolol tartrate (LOPRESSOR) 25 MG tablet Take 1 tablet (25 mg total) by mouth 2 (two) times daily.  60 tablet  1  . Multiple Vitamin (MULTIVITAMIN) tablet Take 1 tablet by mouth daily.       [name] warfarin (COUMADIN) 2 MG tablet Take 2 mg by mouth daily. OR AS DIRECTED BY COUMADIN CLINIC       No current facility-administered medications for this visit.       Physical Exam: BP 100/49  Pulse 52  Resp 16  Ht 5\' 4"  (1.626 m)  Wt 147 lb 7.8 oz (66.9 kg)  BMI 25.30 kg/m2  SpO2 98% On exam continues to be in  atrial fibrillation General appearance: alert, cooperative and no distress Neurologic: intact Heart: irregularly irregular rhythm Lungs: diminished breath sounds LLL Abdomen: soft, non-tender; bowel sounds normal; no masses,  no organomegaly Extremities: extremities normal, atraumatic, no cyanosis Homans sign is negative, no sign of DVT Wound: His sternum is stable and well healed , the left leg lymphocele at the knee is much smaller than previously and does not need further aspiration. He has mild edema at both ankles    Diagnostic Studies & Laboratory data:         Recent Radiology Findings: Dg Chest 2 View  01/14/2014   CLINICAL DATA:  History of CABG  EXAM: CHEST  2 VIEW  COMPARISON:  Chest x-ray  of 12/17/2013  FINDINGS: Opacity remains at the left lung base most consistent with left pleural effusion possibly loculated and left basilar atelectasis. There does appear to be a small right pleural effusion now present. Diffuse calcified pleural plaques are again noted bilaterally. Cardiomegaly is stable. Median sternotomy sutures are noted from prior CABG and a left atrial exclusion device remains. There are degenerative changes throughout the thoracic spine.  IMPRESSION: 1. No significant change in left pleural effusion, possibly loculated. 2. New small right pleural effusion. 3. Diffuse calcified pleural plaques.   Electronically Signed   By: Ivar Drape M.D.   On: 01/14/2014 11:03      Recent Labs: Lab Results  Component Value Date   WBC 7.0 10/27/2013   HGB 10.0* 10/27/2013   HCT 28.1* 10/27/2013   PLT 323 10/27/2013   GLUCOSE 113* 01/04/2014   ALT 11 10/18/2013   AST 18 10/18/2013   NA 137 01/04/2014   K 4.0 01/04/2014   CL 104 01/04/2014   CREATININE 1.1 01/04/2014   BUN 18 01/04/2014   CO2 28 01/04/2014   TSH 1.000 10/22/2013   INR 2.81* 10/29/2013   HGBA1C 6.0* 10/20/2013      Assessment / Plan:     Patient is status post coronary artery bypass grafting in for his age appears to be doing reasonably well.  He continues on diuretic therapy for lower extremity edema and mildly elevated BNP and small pleural effusions. I plan to see him back in 5 weeks He continues doing well in cardiac rehabilitation, both he and his wife note that he continues to gain strength and improve.   Zareya Tuckett B 01/14/2014 12:07 PM

## 2014-01-15 ENCOUNTER — Encounter (HOSPITAL_COMMUNITY)
Admission: RE | Admit: 2014-01-15 | Discharge: 2014-01-15 | Disposition: A | Discharge status: Home / Self Care | Payer: Medicare Other | Source: Ambulatory Visit | Attending: Cardiology | Admitting: Cardiology

## 2014-01-18 ENCOUNTER — Encounter (HOSPITAL_COMMUNITY)
Admission: RE | Admit: 2014-01-18 | Discharge: 2014-01-18 | Disposition: A | Discharge status: Home / Self Care | Payer: Medicare Other | Source: Ambulatory Visit | Attending: Cardiology | Admitting: Cardiology

## 2014-01-18 NOTE — Progress Notes (Signed)
[name] 80 y.o. male Nutrition Note Spoke with pt.  Nutrition Plan and Nutrition Survey goals reviewed with pt. Pt is following Step 1 of the Therapeutic Lifestyle Changes diet. Pt reports having a decreased appetite recently in the past, but reports his appetite is now improving and he is eating more. Pt has little room for improvement in the Therapeutic Lifestyle Changes diet as he currently follows a diet high in fruits, vegetables, and whole grains and monitors his saturated fat intake. Pt reports he does occasionally eat fried foods, but was educated that moderate intake of the foods he enjoys, such as fried foods, is fine. Pt is pre-diabetic. Pt was educated on what pre-diabetes is and that exercising and following a heart healthy diet is important for his health. Pt expressed understanding of the information. Pt aware of nutrition education classes offered and is unable to attend nutrition classes. Pt was given handouts of the information that will be covered during the classes.  Nutrition Diagnosis   Food-and nutrition-related knowledge deficit related to lack of exposure to information as related to diagnosis of: ? CVD ? Pre-DM (A1c 6.0) ?   Nutrition RX/ Estimated Daily Nutrition Needs for: wt maintenance 1700-1950 Kcal, 55-65 gm fat, 11-13 gm sat fat, 1.7-2.0 gm trans-fat, <1500 mg sodium  Nutrition Intervention   Pt's individual nutrition plan reviewed with pt.   Pt given handouts for: ? Nutrition I class ? Nutrition II class    Continue client-centered nutrition education by RD, as part of interdisciplinary care.  Goal(s)   Pt to describe the potential benefits of adopting Therapeutic Lifestyle Changes   Pt to describe the benefit of including fruits, vegetables, whole grains, and low-fat dairy products in a heart healthy meal plan.  Monitor and Evaluate progress toward nutrition goal with team. Nutrition Risk: Change to moderate  [name]  01/18/2014  3:42 PM Derek Mound, M.Ed, RD, LDN, CDE 01/18/2014 3:52 PM

## 2014-01-20 ENCOUNTER — Encounter (HOSPITAL_COMMUNITY)
Admission: RE | Admit: 2014-01-20 | Discharge: 2014-01-20 | Disposition: A | Discharge status: Home / Self Care | Payer: Medicare Other | Source: Ambulatory Visit | Attending: Cardiology | Admitting: Cardiology

## 2014-01-22 ENCOUNTER — Encounter (HOSPITAL_COMMUNITY)
Admission: RE | Admit: 2014-01-22 | Discharge: 2014-01-22 | Disposition: A | Discharge status: Home / Self Care | Payer: Medicare Other | Source: Ambulatory Visit | Attending: Cardiology | Admitting: Cardiology

## 2014-01-22 NOTE — Progress Notes (Signed)
[name] has a follow up appointment with Dr Stanford Breed exercise flow sheets faxed to office for review.

## 2014-01-25 ENCOUNTER — Encounter (HOSPITAL_COMMUNITY)
Admission: RE | Admit: 2014-01-25 | Discharge: 2014-01-25 | Disposition: A | Discharge status: Home / Self Care | Payer: Medicare Other | Source: Ambulatory Visit | Attending: Cardiology | Admitting: Cardiology

## 2014-01-25 ENCOUNTER — Ambulatory Visit (INDEPENDENT_AMBULATORY_CARE_PROVIDER_SITE_OTHER): Payer: Medicare Other | Admitting: Cardiology

## 2014-01-25 ENCOUNTER — Encounter: Payer: Self-pay | Admitting: Cardiology

## 2014-01-25 VITALS — BP 127/60 | HR 75 | Ht 64.0 in | Wt 150.0 lb

## 2014-01-25 DIAGNOSIS — I4891 Unspecified atrial fibrillation: Secondary | ICD-10-CM

## 2014-01-25 DIAGNOSIS — I10 Essential (primary) hypertension: Secondary | ICD-10-CM

## 2014-01-25 DIAGNOSIS — I255 Ischemic cardiomyopathy: Secondary | ICD-10-CM

## 2014-01-25 DIAGNOSIS — I2589 Other forms of chronic ischemic heart disease: Secondary | ICD-10-CM

## 2014-01-25 MED ORDER — POTASSIUM CHLORIDE CRYS ER 20 MEQ PO TBCR: 20.0000 meq | EXTENDED_RELEASE_TABLET | Freq: Every day | ORAL | Status: DC

## 2014-01-25 MED ORDER — FUROSEMIDE 40 MG PO TABS: 80.0000 mg | ORAL_TABLET | Freq: Every day | ORAL | Status: DC

## 2014-01-25 NOTE — Assessment & Plan Note (Signed)
Continue statin. 

## 2014-01-25 NOTE — Patient Instructions (Signed)
Your physician recommends that you schedule a follow-up appointment in:  [place] (FUROSEMIDE) TO 80 MG DAILY\  START POTASSIUM CHLORIDE 20 MEQ DAILY  STOP AMIODARONE  Your physician recommends that you return for lab work in: [place] 4/20

## 2014-01-25 NOTE — Assessment & Plan Note (Signed)
Patient remains in atrial fibrillation. He has been in this rhythm since 2011. Is not interested in pursuing cardioversion. I will continue Cardizem and metoprolol for rate control. Continue Coumadin. Discontinue amiodarone. Watch heart rate and increase other medications if it increases off of amiodarone.

## 2014-01-25 NOTE — Assessment & Plan Note (Signed)
Blood pressure controlled. Continue present medications. 

## 2014-01-25 NOTE — Assessment & Plan Note (Signed)
Continue ARB and beta blocker. Repeat echocardiogram when he returns in 8 weeks. He is volume overloaded on examination. Increase Lasix to 80 mg daily. Add potassium 20 mEq by mouth daily. Check potassium, renal function and BNP in one week.

## 2014-01-25 NOTE — Progress Notes (Signed)
HPI: FU coronary artery disease and atrial fibrillation. Echocardiogram in January of 2015 showed an ejection fraction of 35-40%. There was trace aortic insufficiency, moderate mitral regurgitation and moderate to severe left atrial enlargement. He underwent cardiac catheterization in January of 2015 which revealed severe coronary artery disease including an 80-90% distal left main. Ejection fraction was 35%. Preoperative carotid Dopplers in January of 2015 showed 1-39% bilateral stenosis. The patient underwent coronary artery bypassing graft with a LIMA to the LAD, saphenous vein graft to the obtuse marginal and circumflex and saphenous vein graft to the right coronary artery. He also had placement of atrial clip. It was noted at last office visit in February of 2015 that the patient had been in atrial fibrillation for quite some time. He elected rate control and anticoagulation. Holter monitor in February of 2015 revealed atrial fibrillation with controlled ventricular response. Since he was last seen He has some orthopnea and increased lower extremity edema. No chest pain, palpitations or syncope.   Current Outpatient Prescriptions  Medication Sig Dispense Refill  . amiodarone (PACERONE) 200 MG tablet Take 200 mg by mouth daily.      [name] aspirin EC 81 MG EC tablet Take 1 tablet (81 mg total) by mouth daily.      [name] atorvastatin (LIPITOR) 10 MG tablet Take 1 tablet (10 mg total) by mouth daily at 6 PM.  30 tablet  1  . diltiazem (CARDIZEM CD) 240 MG 24 hr capsule Take 240 mg by mouth daily.      . ferrous sulfate 325 (65 FE) MG tablet Take 325 mg by mouth daily with breakfast.      . furosemide (LASIX) 20 MG tablet Take 2 tablets (40 mg total) by mouth daily.  60 tablet  12  . losartan (COZAAR) 25 MG tablet Take 1 tablet (25 mg total) by mouth daily.  90 tablet  3  . metoprolol tartrate (LOPRESSOR) 25 MG tablet Take 1 tablet (25 mg total) by mouth 2 (two) times daily.  60 tablet  1  . Multiple  Vitamin (MULTIVITAMIN) tablet Take 1 tablet by mouth daily.       [name] warfarin (COUMADIN) 2 MG tablet Take 2 mg by mouth daily. OR AS DIRECTED BY COUMADIN CLINIC       No current facility-administered medications for this visit.     Past Medical History  Diagnosis Date  . Small bowel obstruction   . Atrial fibrillation     with rapid ventricular repsonse  . HTN (hypertension)   . Bone lesion     lytic  . Debility   . S/P CABG x 4 with atrial clip 10/21/2013    Past Surgical History  Procedure Laterality Date  . Appendectomy    . Hernia repair      x2  . Parathyroidectomy    . Coronary artery bypass graft N/A 10/21/2013    Procedure: CORONARY ARTERY BYPASS GRAFTING (CABG);  Surgeon: Grace Isaac, MD;  Location: [facility];  Service: Open Heart Surgery;  Laterality: N/A;  . Intraoperative transesophageal echocardiogram N/A 10/21/2013    Procedure: INTRAOPERATIVE TRANSESOPHAGEAL ECHOCARDIOGRAM;  Surgeon: Grace Isaac, MD;  Location: [facility];  Service: Open Heart Surgery;  Laterality: N/A;  . Clipping of atrial appendage  10/21/2013    Procedure: CLIPPING OF ATRIAL APPENDAGE;  Surgeon: Grace Isaac, MD;  Location: [facility];  Service: Open Heart Surgery;;    History   Social History  . Marital Status: Married  Spouse Name: N/A    Number of Children: N/A  . Years of Education: N/A   Occupational History  . retired    Social History Main Topics  . Smoking status: Former Research scientist (life sciences)  . Smokeless tobacco: Not on file     Comment: During WWII  . Alcohol Use: 4.2 oz/week    7 Glasses of wine per week  . Drug Use: No  . Sexual Activity: Not on file   Other Topics Concern  . 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 Remaining systems are  negative.  Physical Exam: Well-developed well-nourished in no acute distress.  Skin is warm and dry.  HEENT is normal.  Neck is supple.  Chest is clear to auscultation with normal expansion.  Cardiovascular exam is irregular Abdominal exam nontender or distended. No masses palpated. Extremities show 1-2+ ankle edema. neuro grossly intact

## 2014-01-27 ENCOUNTER — Encounter (HOSPITAL_COMMUNITY)
Admission: RE | Admit: 2014-01-27 | Discharge: 2014-01-27 | Disposition: A | Discharge status: Home / Self Care | Payer: Medicare Other | Source: Ambulatory Visit | Attending: Cardiology | Admitting: Cardiology

## 2014-01-29 ENCOUNTER — Encounter (HOSPITAL_COMMUNITY): Payer: Medicare Other

## 2014-02-01 ENCOUNTER — Encounter (HOSPITAL_COMMUNITY): Payer: Medicare Other

## 2014-02-03 ENCOUNTER — Encounter (HOSPITAL_COMMUNITY)
Admission: RE | Admit: 2014-02-03 | Discharge: 2014-02-03 | Disposition: A | Discharge status: Home / Self Care | Payer: Medicare Other | Source: Ambulatory Visit | Attending: Cardiology | Admitting: Cardiology

## 2014-02-05 ENCOUNTER — Encounter (HOSPITAL_COMMUNITY)
Admission: RE | Admit: 2014-02-05 | Discharge: 2014-02-05 | Disposition: A | Discharge status: Home / Self Care | Payer: Medicare Other | Source: Ambulatory Visit | Attending: Cardiology | Admitting: Cardiology

## 2014-02-08 ENCOUNTER — Encounter (HOSPITAL_COMMUNITY)
Admission: RE | Admit: 2014-02-08 | Discharge: 2014-02-08 | Disposition: A | Discharge status: Home / Self Care | Payer: Medicare Other | Source: Ambulatory Visit | Attending: Cardiology | Admitting: Cardiology

## 2014-02-10 ENCOUNTER — Encounter (HOSPITAL_COMMUNITY)
Admission: RE | Admit: 2014-02-10 | Discharge: 2014-02-10 | Disposition: A | Discharge status: Home / Self Care | Payer: Medicare Other | Source: Ambulatory Visit | Attending: Cardiology | Admitting: Cardiology

## 2014-02-10 ENCOUNTER — Other Ambulatory Visit (INDEPENDENT_AMBULATORY_CARE_PROVIDER_SITE_OTHER): Payer: Medicare Other

## 2014-02-10 DIAGNOSIS — I2589 Other forms of chronic ischemic heart disease: Secondary | ICD-10-CM

## 2014-02-10 DIAGNOSIS — I4891 Unspecified atrial fibrillation: Secondary | ICD-10-CM

## 2014-02-10 DIAGNOSIS — I255 Ischemic cardiomyopathy: Secondary | ICD-10-CM

## 2014-02-10 DIAGNOSIS — I10 Essential (primary) hypertension: Secondary | ICD-10-CM

## 2014-02-10 LAB — BASIC METABOLIC PANEL
BUN: 27 mg/dL — ABNORMAL HIGH (ref 6–23)
CALCIUM: 8.9 mg/dL (ref 8.4–10.5)
CHLORIDE: 100 meq/L (ref 96–112)
CO2: 30 mEq/L (ref 19–32)
Creatinine, Ser: 1.1 mg/dL (ref 0.4–1.5)
GFR: 64.97 mL/min (ref >60.00)
Glucose, Bld: 102 mg/dL — ABNORMAL HIGH (ref 70–99)
Potassium: 4.1 mEq/L (ref 3.5–5.1)
SODIUM: 137 meq/L (ref 135–145)

## 2014-02-10 LAB — BRAIN NATRIURETIC PEPTIDE: Pro B Natriuretic peptide (BNP): 346 pg/mL — ABNORMAL HIGH (ref 0.0–100.0)

## 2014-02-11 ENCOUNTER — Encounter: Payer: Self-pay | Admitting: Cardiology

## 2014-02-12 ENCOUNTER — Telehealth: Payer: Self-pay | Admitting: Cardiology

## 2014-02-12 ENCOUNTER — Encounter (HOSPITAL_COMMUNITY)
Admission: RE | Admit: 2014-02-12 | Discharge: 2014-02-12 | Disposition: A | Discharge status: Home / Self Care | Payer: Medicare Other | Source: Ambulatory Visit | Attending: Cardiology | Admitting: Cardiology

## 2014-02-12 DIAGNOSIS — Z5189 Encounter for other specified aftercare: Secondary | ICD-10-CM | POA: Insufficient documentation

## 2014-02-12 DIAGNOSIS — Z951 Presence of aortocoronary bypass graft: Secondary | ICD-10-CM | POA: Insufficient documentation

## 2014-02-12 DIAGNOSIS — I252 Old myocardial infarction: Secondary | ICD-10-CM | POA: Insufficient documentation

## 2014-02-12 DIAGNOSIS — I4891 Unspecified atrial fibrillation: Secondary | ICD-10-CM | POA: Insufficient documentation

## 2014-02-12 NOTE — Telephone Encounter (Signed)
Pt is aware of lab results.

## 2014-02-12 NOTE — Telephone Encounter (Signed)
°  Patient is returning call from Triage. He stated someone called him with test result. Please call and advise.

## 2014-02-15 ENCOUNTER — Encounter (HOSPITAL_COMMUNITY)
Admission: RE | Admit: 2014-02-15 | Discharge: 2014-02-15 | Disposition: A | Discharge status: Home / Self Care | Payer: Medicare Other | Source: Ambulatory Visit | Attending: Cardiology | Admitting: Cardiology

## 2014-02-17 ENCOUNTER — Encounter (HOSPITAL_COMMUNITY)
Admission: RE | Admit: 2014-02-17 | Discharge: 2014-02-17 | Disposition: A | Discharge status: Home / Self Care | Payer: Medicare Other | Source: Ambulatory Visit | Attending: Cardiology | Admitting: Cardiology

## 2014-02-18 ENCOUNTER — Ambulatory Visit (INDEPENDENT_AMBULATORY_CARE_PROVIDER_SITE_OTHER): Payer: Medicare Other | Admitting: Cardiothoracic Surgery

## 2014-02-18 ENCOUNTER — Encounter: Payer: Self-pay | Admitting: Cardiothoracic Surgery

## 2014-02-18 VITALS — BP 128/65 | HR 65 | Resp 20 | Ht 64.0 in | Wt 150.0 lb

## 2014-02-18 DIAGNOSIS — Z951 Presence of aortocoronary bypass graft: Secondary | ICD-10-CM

## 2014-02-18 DIAGNOSIS — I2510 Atherosclerotic heart disease of native coronary artery without angina pectoris: Secondary | ICD-10-CM

## 2014-02-18 NOTE — Progress Notes (Signed)
[facility] Record #[id number] Date of Birth: 1918-09-04  Primary cardiologists: Dr. Everett Graff, MD- primary care  Chief Complaint:   PostOp Follow Up Visit 10/21/2013  PREOPERATIVE DIAGNOSES: Recent non ST-elevation myocardial infarction  with critical left main obstruction and left ventricular dysfunction.  Ejection fraction approximately 30%. Atrial fibrillation.  POSTOPERATIVE DIAGNOSIS: Recent non ST-elevation myocardial infarction  with critical left main obstruction and left ventricular dysfunction.  Ejection fraction approximately 30% with incidental enlarged mammary  chain lymph node.  PROCEDURE PERFORMED: Coronary artery bypass grafting x4 with the left  internal mammary to the left anterior descending coronary artery,  sequential reverse saphenous vein graft to the obtuse marginal and  circumflex coronary artery, reverse saphenous vein graft to the distal  right coronary artery and placement of atrial clip, incidental biopsy,  mammary chain lymph node, and bilateral thigh endo vein harvesting.  SURGEON: Lanelle Bal, MD   History of Present Illness:      Patient 80 years old had recent coronary artery bypass grafting and placement of left atrial clip with bilateral thigh endo- vein harvesting.  He denies any incisional pain. He has no pedal edema. He continues on Coumadin and has had his protime to be checked on Monday discharge. He has noticed no episodes of rapid heartbeat. He is started in cardiac rehabilitation and making progress.  Patient continues with bilateral pedal edema, he is now on Lasix 80 mg once a day. The patient and his wife note that on this dosage swelling has improved. The area lymphocele at the left knee has not recurred.   History  Smoking status  . Former Smoker  Smokeless tobacco  . Not on file    Comment: During WWII       No Known Allergies  Current Outpatient  Prescriptions  Medication Sig Dispense Refill  . aspirin EC 81 MG EC tablet Take 1 tablet (81 mg total) by mouth daily.      [name] atorvastatin (LIPITOR) 10 MG tablet Take 1 tablet (10 mg total) by mouth daily at 6 PM.  30 tablet  1  . diltiazem (CARDIZEM CD) 240 MG 24 hr capsule Take 240 mg by mouth daily.      . ferrous sulfate 325 (65 FE) MG tablet Take 325 mg by mouth daily with breakfast.      . furosemide (LASIX) 40 MG tablet Take 2 tablets (80 mg total) by mouth daily.  60 tablet  12  . losartan (COZAAR) 25 MG tablet Take 1 tablet (25 mg total) by mouth daily.  90 tablet  3  . metoprolol tartrate (LOPRESSOR) 25 MG tablet Take 1 tablet (25 mg total) by mouth 2 (two) times daily.  60 tablet  1  . Multiple Vitamin (MULTIVITAMIN) tablet Take 1 tablet by mouth daily.       . potassium chloride SA (K-DUR,KLOR-CON) 20 MEQ tablet Take 1 tablet (20 mEq total) by mouth daily.  90 tablet  3  . warfarin (COUMADIN) 2 MG tablet Take 2 mg by mouth daily. OR AS DIRECTED BY COUMADIN CLINIC       No current facility-administered medications for this visit.       Physical Exam: BP 128/65  Pulse 65  Resp 20  Ht 5\' 4"  (1.626 m)  Wt 150 lb (68.04 kg)  BMI 25.73 kg/m2  SpO2 95% On exam continues to be in atrial  fibrillation General appearance: alert, cooperative and no distress Neurologic: intact Heart: irregularly irregular rhythm Lungs: diminished breath sounds LLL Abdomen: soft, non-tender; bowel sounds normal; no masses,  no organomegaly Extremities: extremities normal, atraumatic, no cyanosis Homans sign is negative, no sign of DVT Wound: His sternum is stable and well healed , the left leg lymphocele at the knee has resolved . He has mild edema at both ankles    Diagnostic Studies & Laboratory data:         Recent Radiology Findings: No results found.    Recent Labs: Lab Results  Component Value Date   WBC 7.0 10/27/2013   HGB 10.0* 10/27/2013   HCT 28.1* 10/27/2013   PLT 323  10/27/2013   GLUCOSE 102* 02/10/2014   ALT 11 10/18/2013   AST 18 10/18/2013   NA 137 02/10/2014   K 4.1 02/10/2014   CL 100 02/10/2014   CREATININE 1.1 02/10/2014   BUN 27* 02/10/2014   CO2 30 02/10/2014   TSH 1.000 10/22/2013   INR 2.81* 10/29/2013   HGBA1C 6.0* 10/20/2013      Assessment / Plan:    Patient is status post coronary artery bypass grafting in for his age appears to be doing reasonably well.  He continues on diuretic therapy for lower extremity edema and mildly elevated BNP  He continues doing well in cardiac rehabilitation, both he and his wife note that he continues to gain strength and improve. I plan to see him back when necessary.  ESVIN HNAT 02/18/2014 1:42 PM

## 2014-02-19 ENCOUNTER — Encounter (HOSPITAL_COMMUNITY): Payer: Medicare Other

## 2014-02-22 ENCOUNTER — Encounter (HOSPITAL_COMMUNITY)
Admission: RE | Admit: 2014-02-22 | Discharge: 2014-02-22 | Disposition: A | Discharge status: Home / Self Care | Payer: Medicare Other | Source: Ambulatory Visit | Attending: Cardiology | Admitting: Cardiology

## 2014-02-24 ENCOUNTER — Encounter (HOSPITAL_COMMUNITY)
Admission: RE | Admit: 2014-02-24 | Discharge: 2014-02-24 | Disposition: A | Discharge status: Home / Self Care | Payer: Medicare Other | Source: Ambulatory Visit | Attending: Cardiology | Admitting: Cardiology

## 2014-02-26 ENCOUNTER — Encounter (HOSPITAL_COMMUNITY)
Admission: RE | Admit: 2014-02-26 | Discharge: 2014-02-26 | Disposition: A | Discharge status: Home / Self Care | Payer: Medicare Other | Source: Ambulatory Visit | Attending: Cardiology | Admitting: Cardiology

## 2014-03-01 ENCOUNTER — Encounter (HOSPITAL_COMMUNITY)
Admission: RE | Admit: 2014-03-01 | Discharge: 2014-03-01 | Disposition: A | Discharge status: Home / Self Care | Payer: Medicare Other | Source: Ambulatory Visit | Attending: Cardiology | Admitting: Cardiology

## 2014-03-03 ENCOUNTER — Encounter (HOSPITAL_COMMUNITY)
Admission: RE | Admit: 2014-03-03 | Discharge: 2014-03-03 | Disposition: A | Discharge status: Home / Self Care | Payer: Medicare Other | Source: Ambulatory Visit | Attending: Cardiology | Admitting: Cardiology

## 2014-03-05 ENCOUNTER — Encounter (HOSPITAL_COMMUNITY)
Admission: RE | Admit: 2014-03-05 | Discharge: 2014-03-05 | Disposition: A | Discharge status: Home / Self Care | Payer: Medicare Other | Source: Ambulatory Visit | Attending: Cardiology | Admitting: Cardiology

## 2014-03-10 ENCOUNTER — Other Ambulatory Visit: Payer: Self-pay | Admitting: Dermatology

## 2014-03-10 ENCOUNTER — Encounter (HOSPITAL_COMMUNITY): Payer: Medicare Other

## 2014-03-10 ENCOUNTER — Telehealth (HOSPITAL_COMMUNITY): Payer: Self-pay | Admitting: Geriatric Medicine

## 2014-03-12 ENCOUNTER — Encounter (HOSPITAL_COMMUNITY)
Admission: RE | Admit: 2014-03-12 | Discharge: 2014-03-12 | Disposition: A | Discharge status: Home / Self Care | Payer: Medicare Other | Source: Ambulatory Visit | Attending: Cardiology | Admitting: Cardiology

## 2014-03-15 ENCOUNTER — Encounter (HOSPITAL_COMMUNITY)
Admission: RE | Admit: 2014-03-15 | Discharge: 2014-03-15 | Disposition: A | Discharge status: Home / Self Care | Payer: Medicare Other | Source: Ambulatory Visit | Attending: Cardiology | Admitting: Cardiology

## 2014-03-15 DIAGNOSIS — I4891 Unspecified atrial fibrillation: Secondary | ICD-10-CM | POA: Insufficient documentation

## 2014-03-15 DIAGNOSIS — Z5189 Encounter for other specified aftercare: Secondary | ICD-10-CM | POA: Insufficient documentation

## 2014-03-15 DIAGNOSIS — Z951 Presence of aortocoronary bypass graft: Secondary | ICD-10-CM | POA: Insufficient documentation

## 2014-03-15 DIAGNOSIS — I252 Old myocardial infarction: Secondary | ICD-10-CM | POA: Insufficient documentation

## 2014-03-17 ENCOUNTER — Encounter (HOSPITAL_COMMUNITY)
Admission: RE | Admit: 2014-03-17 | Discharge: 2014-03-17 | Disposition: A | Discharge status: Home / Self Care | Payer: Medicare Other | Source: Ambulatory Visit | Attending: Cardiology | Admitting: Cardiology

## 2014-03-18 ENCOUNTER — Encounter: Payer: Self-pay | Admitting: Cardiology

## 2014-03-18 ENCOUNTER — Encounter: Payer: Self-pay | Admitting: *Deleted

## 2014-03-18 ENCOUNTER — Ambulatory Visit (INDEPENDENT_AMBULATORY_CARE_PROVIDER_SITE_OTHER): Payer: Medicare Other | Admitting: Cardiology

## 2014-03-18 VITALS — BP 121/54 | HR 64 | Ht 67.0 in | Wt 144.0 lb

## 2014-03-18 DIAGNOSIS — I4891 Unspecified atrial fibrillation: Secondary | ICD-10-CM

## 2014-03-18 DIAGNOSIS — I429 Cardiomyopathy, unspecified: Secondary | ICD-10-CM

## 2014-03-18 MED ORDER — METOPROLOL SUCCINATE ER 50 MG PO TB24: 50.0000 mg | ORAL_TABLET | Freq: Every day | ORAL | Status: DC

## 2014-03-18 NOTE — Assessment & Plan Note (Signed)
Continue statin. Discontinue aspirin given need for Coumadin.

## 2014-03-18 NOTE — Assessment & Plan Note (Signed)
Patient now in permanent atrial fibrillation.Continue Cardizem and metoprolol for rate control. Continue Coumadin.

## 2014-03-18 NOTE — Progress Notes (Signed)
HPI: FU coronary artery disease and atrial fibrillation. Echocardiogram in January of 2015 showed an ejection fraction of 35-40%. There was trace aortic insufficiency, moderate mitral regurgitation and moderate to severe left atrial enlargement. He underwent cardiac catheterization in January of 2015 which revealed severe coronary artery disease including an 80-90% distal left main. Ejection fraction was 35%. Preoperative carotid Dopplers in January of 2015 showed 1-39% bilateral stenosis. The patient underwent coronary artery bypassing graft with a LIMA to the LAD, saphenous vein graft to the obtuse marginal and circumflex and saphenous vein graft to the right coronary artery. He also had placement of atrial clip. He elected rate control and anticoagulation for afib. Holter monitor in February of 2015 revealed atrial fibrillation with controlled ventricular response. Since he was last seen he Denies dyspnea, chest pain, palpitations or syncope. Mild pedal edema.   Current Outpatient Prescriptions  Medication Sig Dispense Refill  . aspirin EC 81 MG EC tablet Take 1 tablet (81 mg total) by mouth daily.      [name] atorvastatin (LIPITOR) 10 MG tablet Take 1 tablet (10 mg total) by mouth daily at 6 PM.  30 tablet  1  . diltiazem (CARDIZEM CD) 240 MG 24 hr capsule Take 240 mg by mouth daily.      . ferrous sulfate 325 (65 FE) MG tablet Take 325 mg by mouth daily with breakfast.      . furosemide (LASIX) 40 MG tablet Take 2 tablets (80 mg total) by mouth daily.  60 tablet  12  . losartan (COZAAR) 25 MG tablet Take 1 tablet (25 mg total) by mouth daily.  90 tablet  3  . metoprolol tartrate (LOPRESSOR) 25 MG tablet Take 1 tablet (25 mg total) by mouth 2 (two) times daily.  60 tablet  1  . Multiple Vitamin (MULTIVITAMIN) tablet Take 1 tablet by mouth daily.       . potassium chloride SA (K-DUR,KLOR-CON) 20 MEQ tablet Take 1 tablet (20 mEq total) by mouth daily.  90 tablet  3  . warfarin (COUMADIN) 2 MG  tablet Take 3 mg by mouth daily. OR AS DIRECTED BY COUMADIN CLINIC       No current facility-administered medications for this visit.     Past Medical History  Diagnosis Date  . Small bowel obstruction   . Atrial fibrillation     with rapid ventricular repsonse  . HTN (hypertension)   . Bone lesion     lytic  . Debility   . S/P CABG x 4 with atrial clip 10/21/2013    Past Surgical History  Procedure Laterality Date  . Appendectomy    . Hernia repair      x2  . Parathyroidectomy    . Coronary artery bypass graft N/A 10/21/2013    Procedure: CORONARY ARTERY BYPASS GRAFTING (CABG);  Surgeon: Grace Isaac, MD;  Location: [facility];  Service: Open Heart Surgery;  Laterality: N/A;  . Intraoperative transesophageal echocardiogram N/A 10/21/2013    Procedure: INTRAOPERATIVE TRANSESOPHAGEAL ECHOCARDIOGRAM;  Surgeon: Grace Isaac, MD;  Location: [facility];  Service: Open Heart Surgery;  Laterality: N/A;  . Clipping of atrial appendage  10/21/2013    Procedure: CLIPPING OF ATRIAL APPENDAGE;  Surgeon: Grace Isaac, MD;  Location: [facility];  Service: Open Heart Surgery;;    History   Social History  . Marital Status: Married    Spouse Name: N/A    Number of Children: N/A  . Years of Education: N/A  Occupational History  . retired    Social History Main Topics  . Smoking status: Former Research scientist (life sciences)  . Smokeless tobacco: Not on file     Comment: During WWII  . Alcohol Use: 4.2 oz/week    7 Glasses of wine per week  . Drug Use: No  . Sexual Activity: Not on file   Other Topics Concern  . 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 Remaining systems are negative.  Physical Exam: Well-developed well-nourished in no acute distress.  Skin is warm and dry.    HEENT is normal.  Neck is supple.  Chest is clear to auscultation with normal expansion.  Cardiovascular exam is irregular Abdominal exam nontender or distended. No masses palpated. Extremities show 1+ edema. neuro grossly intact

## 2014-03-18 NOTE — Assessment & Plan Note (Signed)
Blood pressure controlled. Continue present medications. 

## 2014-03-18 NOTE — Patient Instructions (Signed)
Your physician wants you to follow-up in: [place] will receive a reminder letter in the mail two months in advance. If you don't receive a letter, please call our office to schedule the follow-up appointment.   Your physician has requested that you have an echocardiogram. Echocardiography is a painless test that uses sound waves to create images of your heart. It provides your doctor with information about the size and shape of your heart and how well your heart's chambers and valves are working. This procedure takes approximately one hour. There are no restrictions for this procedure.   STOP ASPIRIN  STOP METOPROLOL TART  START METOPROLOL ER 50 MG ONCE DAILY

## 2014-03-18 NOTE — Assessment & Plan Note (Addendum)
Continue ARB and beta blocker. Change metoprolol to Toprol 50 mg daily. Repeat echocardiogram. Volume status is reasonable. Continue present dose of Lasix.

## 2014-03-19 ENCOUNTER — Encounter (HOSPITAL_COMMUNITY)
Admission: RE | Admit: 2014-03-19 | Discharge: 2014-03-19 | Disposition: A | Discharge status: Home / Self Care | Payer: Medicare Other | Source: Ambulatory Visit | Attending: Cardiology | Admitting: Cardiology

## 2014-03-22 ENCOUNTER — Encounter (HOSPITAL_COMMUNITY)
Admission: RE | Admit: 2014-03-22 | Discharge: 2014-03-22 | Disposition: A | Discharge status: Home / Self Care | Payer: Medicare Other | Source: Ambulatory Visit | Attending: Cardiology | Admitting: Cardiology

## 2014-03-24 ENCOUNTER — Encounter (HOSPITAL_COMMUNITY)
Admission: RE | Admit: 2014-03-24 | Discharge: 2014-03-24 | Disposition: A | Discharge status: Home / Self Care | Payer: Medicare Other | Source: Ambulatory Visit | Attending: Cardiology | Admitting: Cardiology

## 2014-03-26 ENCOUNTER — Encounter (HOSPITAL_COMMUNITY)
Admission: RE | Admit: 2014-03-26 | Discharge: 2014-03-26 | Disposition: A | Discharge status: Home / Self Care | Payer: Medicare Other | Source: Ambulatory Visit | Attending: Cardiology | Admitting: Cardiology

## 2014-03-29 ENCOUNTER — Encounter (HOSPITAL_COMMUNITY)
Admission: RE | Admit: 2014-03-29 | Discharge: 2014-03-29 | Disposition: A | Discharge status: Home / Self Care | Payer: Medicare Other | Source: Ambulatory Visit | Attending: Cardiology | Admitting: Cardiology

## 2014-03-29 NOTE — Progress Notes (Signed)
[name] graduates today he plans to continue exercise at the retirement community.

## 2014-03-31 ENCOUNTER — Encounter (HOSPITAL_COMMUNITY): Payer: Medicare Other

## 2014-04-02 ENCOUNTER — Encounter (HOSPITAL_COMMUNITY): Payer: Medicare Other

## 2014-04-05 ENCOUNTER — Encounter (HOSPITAL_COMMUNITY): Payer: Medicare Other

## 2014-04-07 ENCOUNTER — Encounter (HOSPITAL_COMMUNITY): Payer: Medicare Other

## 2014-04-09 ENCOUNTER — Encounter (HOSPITAL_COMMUNITY): Payer: Medicare Other

## 2014-04-12 ENCOUNTER — Encounter (HOSPITAL_COMMUNITY): Payer: Medicare Other

## 2014-04-14 ENCOUNTER — Encounter (HOSPITAL_COMMUNITY): Payer: Medicare Other

## 2014-04-16 ENCOUNTER — Encounter (HOSPITAL_COMMUNITY): Payer: Medicare Other

## 2014-04-19 ENCOUNTER — Encounter (HOSPITAL_COMMUNITY): Payer: Medicare Other

## 2014-04-21 ENCOUNTER — Encounter (HOSPITAL_COMMUNITY): Payer: Medicare Other

## 2014-04-21 ENCOUNTER — Ambulatory Visit (HOSPITAL_COMMUNITY)
Admission: RE | Admit: 2014-04-21 | Discharge: 2014-04-21 | Disposition: A | Discharge status: Home / Self Care | Payer: Medicare Other | Source: Ambulatory Visit | Attending: Cardiology | Admitting: Cardiology

## 2014-04-21 DIAGNOSIS — I429 Cardiomyopathy, unspecified: Secondary | ICD-10-CM | POA: Insufficient documentation

## 2014-04-21 DIAGNOSIS — I369 Nonrheumatic tricuspid valve disorder, unspecified: Secondary | ICD-10-CM

## 2014-04-21 DIAGNOSIS — I4891 Unspecified atrial fibrillation: Secondary | ICD-10-CM | POA: Insufficient documentation

## 2014-04-21 DIAGNOSIS — I255 Ischemic cardiomyopathy: Secondary | ICD-10-CM

## 2014-04-21 NOTE — Progress Notes (Signed)
2D Echocardiogram Complete.  04/21/2014   Caral Whan, RDCS  

## 2014-04-23 ENCOUNTER — Encounter (HOSPITAL_COMMUNITY): Payer: Medicare Other

## 2014-07-02 ENCOUNTER — Encounter: Payer: Self-pay | Admitting: *Deleted

## 2014-09-23 ENCOUNTER — Encounter (HOSPITAL_COMMUNITY): Payer: Self-pay | Admitting: Cardiovascular Disease

## 2014-10-18 NOTE — Progress Notes (Signed)
HPI: FU coronary artery disease and atrial fibrillation. Echocardiogram in January of 2015 showed an ejection fraction of 35-40%. There was trace aortic insufficiency, moderate mitral regurgitation and moderate to severe left atrial enlargement. He underwent cardiac catheterization in January of 2015 which revealed severe coronary artery disease including an 80-90% distal left main. Ejection fraction was 35%. Preoperative carotid Dopplers in January of 2015 showed 1-39% bilateral stenosis. The patient underwent coronary artery bypassing graft with a LIMA to the LAD, saphenous vein graft to the obtuse marginal and circumflex and saphenous vein graft to the right coronary artery. He also had placement of atrial clip. He elected rate control and anticoagulation for afib. Holter monitor in February of 2015 revealed atrial fibrillation with controlled ventricular response. Echocardiogram repeated in July 2015 and showed ejection fraction 50-55%, moderate left atrial enlargement and trace aortic insufficiency. Since he was last seen the patient denies any dyspnea on exertion, orthopnea, PND, pedal edema, palpitations, syncope or chest pain.    Current Outpatient Prescriptions  Medication Sig Dispense Refill  . atorvastatin (LIPITOR) 10 MG tablet Take 1 tablet (10 mg total) by mouth daily at 6 PM. 30 tablet 1  . diltiazem (CARDIZEM CD) 240 MG 24 hr capsule Take 240 mg by mouth daily.    . furosemide (LASIX) 40 MG tablet Take 40 mg by mouth.    . losartan (COZAAR) 25 MG tablet Take 1 tablet (25 mg total) by mouth daily. 90 tablet 3  . metoprolol succinate (TOPROL-XL) 50 MG 24 hr tablet Take 1 tablet (50 mg total) by mouth daily. Take with or immediately following a meal. 90 tablet 3  . Multiple Vitamin (MULTIVITAMIN) tablet Take 1 tablet by mouth daily.     . potassium chloride SA (K-DUR,KLOR-CON) 20 MEQ tablet Take 1 tablet (20 mEq total) by mouth daily. 90 tablet 3  . warfarin (COUMADIN) 2 MG tablet  Take 3 mg by mouth daily. OR AS DIRECTED BY COUMADIN CLINIC     No current facility-administered medications for this visit.     Past Medical History  Diagnosis Date  . Small bowel obstruction   . Atrial fibrillation     with rapid ventricular repsonse  . HTN (hypertension)   . Bone lesion     lytic  . Debility   . S/P CABG x 4 with atrial clip 10/21/2013    Past Surgical History  Procedure Laterality Date  . Appendectomy    . Hernia repair      x2  . Parathyroidectomy    . Coronary artery bypass graft N/A 10/21/2013    Procedure: CORONARY ARTERY BYPASS GRAFTING (CABG);  Surgeon: Grace Isaac, MD;  Location: [place];  Service: Open Heart Surgery;  Laterality: N/A;  . Intraoperative transesophageal echocardiogram N/A 10/21/2013    Procedure: INTRAOPERATIVE TRANSESOPHAGEAL ECHOCARDIOGRAM;  Surgeon: Grace Isaac, MD;  Location: [place];  Service: Open Heart Surgery;  Laterality: N/A;  . Clipping of atrial appendage  10/21/2013    Procedure: CLIPPING OF ATRIAL APPENDAGE;  Surgeon: Grace Isaac, MD;  Location: [place];  Service: Open Heart Surgery;;  . Left heart catheterization with coronary angiogram N/A 10/20/2013    Procedure: LEFT HEART CATHETERIZATION WITH CORONARY ANGIOGRAM;  Surgeon: Troy Sine, MD;  Location: [facility] CATH LAB;  Service: Cardiovascular;  Laterality: N/A;    History   Social History  . Marital Status: Married    Spouse Name: N/A    Number of Children: N/A  .  Years of Education: N/A   Occupational History  . retired    Social History Main Topics  . Smoking status: Former Research scientist (life sciences)  . Smokeless tobacco: Not on file     Comment: During WWII  . Alcohol Use: 4.2 oz/week    7 Glasses of wine per week  . Drug Use: No  . Sexual Activity: Not on file   Other Topics Concern  . 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 Remaining systems are negative.  Physical Exam: Well-developed well-nourished in no acute distress.  Skin is warm and dry.  HEENT is normal.  Neck is supple.  Chest is clear to auscultation with normal expansion.  Cardiovascular exam is irregular Abdominal exam nontender or distended. No masses palpated. Extremities show trace edema. neuro grossly intact  ECG atrial fibrillation at a rate of 73. Incomplete right bundle branch block. Nonspecific ST changes.

## 2014-10-19 ENCOUNTER — Encounter: Payer: Self-pay | Admitting: Cardiology

## 2014-10-19 ENCOUNTER — Ambulatory Visit (INDEPENDENT_AMBULATORY_CARE_PROVIDER_SITE_OTHER): Payer: Medicare Other | Admitting: Cardiology

## 2014-10-19 VITALS — BP 140/51 | HR 73 | Ht 63.0 in | Wt 154.0 lb

## 2014-10-19 DIAGNOSIS — I255 Ischemic cardiomyopathy: Secondary | ICD-10-CM

## 2014-10-19 DIAGNOSIS — I2510 Atherosclerotic heart disease of native coronary artery without angina pectoris: Secondary | ICD-10-CM

## 2014-10-19 DIAGNOSIS — I2583 Coronary atherosclerosis due to lipid rich plaque: Secondary | ICD-10-CM

## 2014-10-19 DIAGNOSIS — I10 Essential (primary) hypertension: Secondary | ICD-10-CM

## 2014-10-19 DIAGNOSIS — I4891 Unspecified atrial fibrillation: Secondary | ICD-10-CM

## 2014-10-19 NOTE — Assessment & Plan Note (Signed)
Continue statin.not on aspirin given need for Coumadin.

## 2014-10-19 NOTE — Patient Instructions (Signed)
Your physician wants you to follow-up in: ONE YEAR WITH DR CRENSHAW You will receive a reminder letter in the mail two months in advance. If you don't receive a letter, please call our office to schedule the follow-up appointment.  

## 2014-10-19 NOTE — Assessment & Plan Note (Signed)
Patient remains in permanent atrial fibrillation. Continue Cardizem and Toprol for rate control. Continue Coumadin. Hemoglobin monitored by primary care.

## 2014-10-19 NOTE — Assessment & Plan Note (Signed)
Continue ARB and beta blocker. LV function improved on most recent echocardiogram.

## 2014-10-19 NOTE — Assessment & Plan Note (Signed)
Blood pressure controlled. Continue present medications. 

## 2014-11-29 ENCOUNTER — Other Ambulatory Visit: Payer: Self-pay | Admitting: Cardiology

## 2014-11-29 NOTE — Telephone Encounter (Signed)
NEEDS APPT FOR REFILLS

## 2015-01-03 ENCOUNTER — Other Ambulatory Visit: Payer: Self-pay | Admitting: Cardiology

## 2015-02-25 ENCOUNTER — Other Ambulatory Visit: Payer: Self-pay | Admitting: Cardiology

## 2015-05-25 ENCOUNTER — Other Ambulatory Visit: Payer: Self-pay | Admitting: Adult Health

## 2015-07-15 ENCOUNTER — Other Ambulatory Visit: Payer: Self-pay

## 2015-07-15 MED ORDER — FUROSEMIDE 40 MG PO TABS: 40.0000 mg | ORAL_TABLET | Freq: Two times a day (BID) | ORAL | Status: DC

## 2015-08-04 ENCOUNTER — Ambulatory Visit
Admission: RE | Admit: 2015-08-04 | Discharge: 2015-08-04 | Disposition: A | Discharge status: Home / Self Care | Payer: Medicare Other | Source: Ambulatory Visit | Attending: Geriatric Medicine | Admitting: Geriatric Medicine

## 2015-08-04 ENCOUNTER — Other Ambulatory Visit: Payer: Self-pay | Admitting: Geriatric Medicine

## 2015-08-04 DIAGNOSIS — J61 Pneumoconiosis due to asbestos and other mineral fibers: Secondary | ICD-10-CM

## 2015-08-05 ENCOUNTER — Other Ambulatory Visit: Payer: Self-pay | Admitting: Geriatric Medicine

## 2015-08-05 DIAGNOSIS — R918 Other nonspecific abnormal finding of lung field: Secondary | ICD-10-CM

## 2015-08-08 ENCOUNTER — Ambulatory Visit
Admission: RE | Admit: 2015-08-08 | Discharge: 2015-08-08 | Disposition: A | Discharge status: Home / Self Care | Payer: Medicare Other | Source: Ambulatory Visit | Attending: Geriatric Medicine | Admitting: Geriatric Medicine

## 2015-08-08 DIAGNOSIS — R918 Other nonspecific abnormal finding of lung field: Secondary | ICD-10-CM

## 2015-08-08 MED ORDER — IOPAMIDOL (ISOVUE-300) INJECTION 61%
75.0000 mL | Freq: Once | INTRAVENOUS | Status: AC | PRN
  Administered 2015-08-08: 75 mL via INTRAVENOUS

## 2015-08-09 ENCOUNTER — Other Ambulatory Visit (HOSPITAL_COMMUNITY): Payer: Self-pay | Admitting: Geriatric Medicine

## 2015-08-09 DIAGNOSIS — R911 Solitary pulmonary nodule: Secondary | ICD-10-CM

## 2015-08-18 ENCOUNTER — Ambulatory Visit (HOSPITAL_COMMUNITY)
Admission: RE | Admit: 2015-08-18 | Discharge: 2015-08-18 | Disposition: A | Discharge status: Home / Self Care | Payer: Medicare Other | Source: Ambulatory Visit | Attending: Geriatric Medicine | Admitting: Geriatric Medicine

## 2015-08-18 DIAGNOSIS — J61 Pneumoconiosis due to asbestos and other mineral fibers: Secondary | ICD-10-CM | POA: Insufficient documentation

## 2015-08-18 DIAGNOSIS — J90 Pleural effusion, not elsewhere classified: Secondary | ICD-10-CM | POA: Diagnosis not present

## 2015-08-18 DIAGNOSIS — R911 Solitary pulmonary nodule: Secondary | ICD-10-CM | POA: Diagnosis not present

## 2015-08-18 LAB — GLUCOSE, CAPILLARY: Glucose-Capillary: 98 mg/dL (ref 65–99)

## 2015-08-18 MED ORDER — FLUDEOXYGLUCOSE F - 18 (FDG) INJECTION
7.6600 | Freq: Once | INTRAVENOUS | Status: DC | PRN
  Administered 2015-08-18: 7.66 via INTRAVENOUS
  Filled 2015-08-18: qty 7.66

## 2015-08-24 ENCOUNTER — Other Ambulatory Visit: Payer: Self-pay | Admitting: Cardiology

## 2015-08-24 ENCOUNTER — Other Ambulatory Visit: Payer: Self-pay

## 2015-08-24 MED ORDER — METOPROLOL SUCCINATE ER 50 MG PO TB24: ORAL_TABLET | ORAL | Status: DC

## 2015-08-24 MED ORDER — LOSARTAN POTASSIUM 25 MG PO TABS: ORAL_TABLET | ORAL | Status: DC

## 2015-08-24 NOTE — Telephone Encounter (Signed)
Lelon Perla, MD at 10/18/2014 4:01 PM  losartan (COZAAR) 25 MG tabletTake 1 tablet (25 mg total) by mouth daily Patient Instructions     Your physician wants you to follow-up in: [place] will receive a reminder letter in the mail two months in advance. If you don't receive a letter, please call our office to schedule the follow-up appointment

## 2015-09-29 ENCOUNTER — Other Ambulatory Visit: Payer: Self-pay | Admitting: Cardiology

## 2015-09-29 NOTE — Telephone Encounter (Signed)
Rx(s) sent to pharmacy electronically.  

## 2015-10-03 ENCOUNTER — Other Ambulatory Visit (HOSPITAL_COMMUNITY): Payer: Self-pay | Admitting: Otolaryngology

## 2015-10-03 DIAGNOSIS — R221 Localized swelling, mass and lump, neck: Principal | ICD-10-CM

## 2015-10-03 DIAGNOSIS — R220 Localized swelling, mass and lump, head: Secondary | ICD-10-CM

## 2015-10-14 ENCOUNTER — Other Ambulatory Visit: Payer: Self-pay | Admitting: Radiology

## 2015-10-17 ENCOUNTER — Other Ambulatory Visit: Payer: Self-pay | Admitting: Radiology

## 2015-10-18 ENCOUNTER — Ambulatory Visit (HOSPITAL_COMMUNITY)
Admission: RE | Admit: 2015-10-18 | Discharge: 2015-10-18 | Disposition: A | Discharge status: Home / Self Care | Payer: Medicare Other | Source: Ambulatory Visit | Attending: Otolaryngology | Admitting: Otolaryngology

## 2015-10-18 ENCOUNTER — Encounter (HOSPITAL_COMMUNITY): Payer: Self-pay

## 2015-10-18 DIAGNOSIS — R221 Localized swelling, mass and lump, neck: Secondary | ICD-10-CM | POA: Diagnosis not present

## 2015-10-18 DIAGNOSIS — R220 Localized swelling, mass and lump, head: Secondary | ICD-10-CM | POA: Insufficient documentation

## 2015-10-18 DIAGNOSIS — D490 Neoplasm of unspecified behavior of digestive system: Secondary | ICD-10-CM | POA: Diagnosis not present

## 2015-10-18 LAB — PROTIME-INR
INR: 1.31 (ref 0.00–1.49)
PROTHROMBIN TIME: 16.4 s — AB (ref 11.6–15.2)

## 2015-10-18 LAB — CBC
HCT: 42.8 % (ref 39.0–52.0)
Hemoglobin: 14.4 g/dL (ref 13.0–17.0)
MCH: 31 pg (ref 26.0–34.0)
MCHC: 33.6 g/dL (ref 30.0–36.0)
MCV: 92.2 fL (ref 78.0–100.0)
PLATELETS: 277 10*3/uL (ref 150–400)
RBC: 4.64 MIL/uL (ref 4.22–5.81)
RDW: 15.6 % — AB (ref 11.5–15.5)
WBC: 7.6 10*3/uL (ref 4.0–10.5)

## 2015-10-18 LAB — APTT: APTT: 33 s (ref 24–37)

## 2015-10-18 MED ORDER — SODIUM CHLORIDE 0.9 % IV SOLN: Freq: Once | INTRAVENOUS | Status: DC

## 2015-10-18 MED ORDER — LIDOCAINE HCL (PF) 1 % IJ SOLN
INTRAMUSCULAR | Status: DC
Start: 2015-10-18 — End: 2015-10-19
  Filled 2015-10-18: qty 10

## 2015-10-18 NOTE — Procedures (Signed)
R parotid core bx under [place], 18g x2 to surg path No complication No blood loss. See complete dictation in [facility].

## 2015-10-18 NOTE — H&P (Signed)
Chief Complaint: Patient was seen in consultation today for Right parotid mass biopsy at the request of Shoemaker,David  Referring Physician(s): Shoemaker,David  History of Present Illness: [name] is a 82 y.o. male   Pt has noticed this same parotid mass for years No change noted Abnormal CXR in Oct required follow up CT scan and PET +PET noted activity in this same area IMPRESSION: 1. No evidence of malignancy within the thorax. Soft tissue nodule along the lingula pleural surface with mild metabolic activity is felt inflammatory. 2. Bilateral bulky calcific plaques pleural consistent with asbestosis. No significant metabolic activity to suggests malignancy. 3. Small LEFT effusion. 4. Intensely hypermetabolic lymph node in the superficial RIGHT neck just beneath the parotid gland. Favor a primary parotid lesion (benign or malignant) within the tail of the RIGHT parotid gland. Cannot exclude a metastatic lymph node from occult primary. Recommend ENT evaluation.  Was referred to Dr Brendolyn Patty request for biopsy of Rt parotid mass Non smoker and no smokeless tobacco   Past Medical History  Diagnosis Date  . Small bowel obstruction ([place])   . Atrial fibrillation ([place])     with rapid ventricular repsonse  . HTN (hypertension)   . Bone lesion     lytic  . Debility   . S/P CABG x 4 with atrial clip 10/21/2013    Past Surgical History  Procedure Laterality Date  . Appendectomy    . Hernia repair      x2  . Parathyroidectomy    . Coronary artery bypass graft N/A 10/21/2013    Procedure: CORONARY ARTERY BYPASS GRAFTING (CABG);  Surgeon: Grace Isaac, MD;  Location: [place];  Service: Open Heart Surgery;  Laterality: N/A;  . Intraoperative transesophageal echocardiogram N/A 10/21/2013    Procedure: INTRAOPERATIVE TRANSESOPHAGEAL ECHOCARDIOGRAM;  Surgeon: Grace Isaac, MD;  Location: [place];  Service: Open Heart Surgery;  Laterality: N/A;  . Clipping of  atrial appendage  10/21/2013    Procedure: CLIPPING OF ATRIAL APPENDAGE;  Surgeon: Grace Isaac, MD;  Location: [place];  Service: Open Heart Surgery;;  . Left heart catheterization with coronary angiogram N/A 10/20/2013    Procedure: LEFT HEART CATHETERIZATION WITH CORONARY ANGIOGRAM;  Surgeon: Troy Sine, MD;  Location: [facility] CATH LAB;  Service: Cardiovascular;  Laterality: N/A;    Allergies: Review of patient's allergies indicates no known allergies.  Medications: Prior to Admission medications   Medication Sig Start Date End Date Taking? Authorizing Provider  diltiazem (CARDIZEM CD) 240 MG 24 hr capsule Take 240 mg by mouth daily.   Yes Historical Provider, MD  furosemide (LASIX) 40 MG tablet Take 1 tablet (40 mg total) by mouth 2 (two) times daily. Patient taking differently: Take 40 mg by mouth daily.  07/15/15  Yes Lelon Perla, MD  losartan (COZAAR) 25 MG tablet TAKE BY MOUTH ONE TABLET EVERY DAY Patient taking differently: Take 25 mg by mouth daily. TAKE BY MOUTH ONE TABLET EVERY DAY 08/24/15  Yes Lelon Perla, MD  metoprolol succinate (TOPROL-XL) 50 MG 24 hr tablet TAKE 1 TABLET BY MOUTH DAILY. TAKE WITH OR IMMEDIATELY FOLLOWING A MEAL 08/24/15  Yes Lelon Perla, MD  Multiple Vitamin (MULTIVITAMIN) tablet Take 1 tablet by mouth daily.    Yes Historical Provider, MD  potassium chloride SA (K-DUR,KLOR-CON) 20 MEQ tablet Take 1 tablet (20 mEq total) by mouth daily. Please keep upcoming appt 09/29/15  Yes Lelon Perla, MD  warfarin (COUMADIN) 2 MG tablet Take  3 mg by mouth daily. OR AS DIRECTED BY COUMADIN CLINIC 4 mg 2 days then 3 mg 1 day alternating   Yes Historical Provider, MD     History reviewed. No pertinent family history.  Social History   Social History  . Marital Status: Married    Spouse Name: N/A  . Number of Children: N/A  . Years of Education: N/A   Occupational History  . retired    Social History Main Topics  . Smoking status: Former Research scientist (life sciences)    . Smokeless tobacco: None     Comment: During WWII  . Alcohol Use: 4.2 oz/week    7 Glasses of wine per week  . Drug Use: No  . Sexual Activity: Not Asked   Other Topics Concern  . 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  All other systems are negative.  Review of Systems  Constitutional: Negative for fever, activity change and fatigue.  HENT: Negative for sore throat and trouble swallowing.   Respiratory: Negative for cough and shortness of breath.   Gastrointestinal: Negative for abdominal pain.  Genitourinary: Negative for urgency.  Musculoskeletal: Negative for neck stiffness.  Neurological: Negative for weakness.  Psychiatric/Behavioral: Negative for behavioral problems and confusion.    Vital Signs: BP 164/91 mmHg  Pulse 93  Temp(Src) 97.9 F (36.6 C) (Oral)  Resp 16  Ht 5\' 7"  (1.702 m)  Wt 155 lb (70.308 kg)  BMI 24.27 kg/m2  SpO2 96%  Physical Exam  Constitutional: He is oriented to person, place, and time.  Cardiovascular: Normal rate, regular rhythm and normal heart sounds.   Pulmonary/Chest: Effort normal and breath sounds normal. He has no wheezes.  Abdominal: Soft. Bowel sounds are normal. There is no tenderness.  Musculoskeletal: Normal range of motion.  Neurological: He is alert and oriented to person, place, and time.  Skin: Skin is warm and dry.  Psychiatric: He has a normal mood and affect. His behavior is normal. Judgment and thought content normal.  Nursing note and vitals reviewed.   Mallampati Score:  MD Evaluation Airway: WNL Heart: WNL Abdomen: WNL Chest/ Lungs: WNL ASA  Classification: 2 Mallampati/Airway Score: Two  Imaging: No results found.  Labs:  CBC:  Recent Labs  10/18/15 1215  WBC 7.6  HGB 14.4  HCT 42.8  PLT 277    COAGS:  Recent Labs  10/18/15 1215  INR 1.31  APTT  33    BMP: No results for input(s): NA, K, CL, CO2, GLUCOSE, BUN, CALCIUM, CREATININE, GFRNONAA, GFRAA in the last 8760 hours.  Invalid input(s): CMP  LIVER FUNCTION TESTS: No results for input(s): BILITOT, AST, ALT, ALKPHOS, PROT, ALBUMIN in the last 8760 hours.  TUMOR MARKERS: No results for input(s): AFPTM, CEA, CA199, CHROMGRNA in the last 8760 hours.  Assessment and Plan:  Rt parotid mass  +PET Scheduled for biopsy of same Risks and Benefits discussed with the patient including, but not limited to bleeding, infection, damage to adjacent structures or low yield requiring additional tests. All of the patient's questions were answered, patient is agreeable to proceed. Consent signed and in chart.   Thank you for this interesting consult.  I greatly enjoyed meeting [name] and look forward to participating in their care.  A copy of this report was sent to the requesting provider on this date.  Signed: Damali Broadfoot A 10/18/2015, 1:20 PM   I spent a total of  30 Minutes   in face to face in clinical consultation, greater than 50% of which was counseling/coordinating care for Rt parotid mass bx

## 2015-10-21 ENCOUNTER — Ambulatory Visit: Payer: Medicare Other | Admitting: Cardiology

## 2015-10-24 NOTE — Progress Notes (Signed)
HPI: FU coronary artery disease and atrial fibrillation. Cardiac catheterization in January of 2015 revealed severe coronary artery disease including an 80-90% distal left main. Ejection fraction was 35%. Preoperative carotid Dopplers in January of 2015 showed 1-39% bilateral stenosis. The patient underwent coronary artery bypassing graft with a LIMA to the LAD, saphenous vein graft to the obtuse marginal and circumflex and saphenous vein graft to the right coronary artery. He also had placement of atrial clip. He elected rate control and anticoagulation for afib. Holter monitor in February of 2015 revealed atrial fibrillation with controlled ventricular response. Echocardiogram repeated in July 2015 and showed ejection fraction 50-55%, moderate left atrial enlargement and trace aortic insufficiency. Since he was last seen the patient denies any dyspnea on exertion, orthopnea, PND, pedal edema, palpitations, syncope or chest pain.   Current Outpatient Prescriptions  Medication Sig Dispense Refill  . diltiazem (CARDIZEM CD) 240 MG 24 hr capsule Take 240 mg by mouth daily.    . furosemide (LASIX) 40 MG tablet Take 1 tablet (40 mg total) by mouth 2 (two) times daily. (Patient taking differently: Take 40 mg by mouth daily. ) 60 tablet 3  . losartan (COZAAR) 25 MG tablet TAKE BY MOUTH ONE TABLET EVERY DAY (Patient taking differently: Take 25 mg by mouth daily. TAKE BY MOUTH ONE TABLET EVERY DAY) 90 tablet 0  . metoprolol succinate (TOPROL-XL) 50 MG 24 hr tablet TAKE 1 TABLET BY MOUTH DAILY. TAKE WITH OR IMMEDIATELY FOLLOWING A MEAL 90 tablet 0  . Multiple Vitamin (MULTIVITAMIN) tablet Take 1 tablet by mouth daily.     . potassium chloride SA (K-DUR,KLOR-CON) 20 MEQ tablet Take 1 tablet (20 mEq total) by mouth daily. Please keep upcoming appt 90 tablet 0  . warfarin (COUMADIN) 2 MG tablet Take 3 mg by mouth daily. OR AS DIRECTED BY COUMADIN CLINIC 4 mg 2 days then 3 mg 1 day alternating     No  current facility-administered medications for this visit.     Past Medical History  Diagnosis Date  . Small bowel obstruction ([place])   . Atrial fibrillation ([place])     with rapid ventricular repsonse  . HTN (hypertension)   . Bone lesion     lytic  . Debility   . S/P CABG x 4 with atrial clip 10/21/2013    Past Surgical History  Procedure Laterality Date  . Appendectomy    . Hernia repair      x2  . Parathyroidectomy    . Coronary artery bypass graft N/A 10/21/2013    Procedure: CORONARY ARTERY BYPASS GRAFTING (CABG);  Surgeon: Grace Isaac, MD;  Location: [place];  Service: Open Heart Surgery;  Laterality: N/A;  . Intraoperative transesophageal echocardiogram N/A 10/21/2013    Procedure: INTRAOPERATIVE TRANSESOPHAGEAL ECHOCARDIOGRAM;  Surgeon: Grace Isaac, MD;  Location: [place];  Service: Open Heart Surgery;  Laterality: N/A;  . Clipping of atrial appendage  10/21/2013    Procedure: CLIPPING OF ATRIAL APPENDAGE;  Surgeon: Grace Isaac, MD;  Location: [place];  Service: Open Heart Surgery;;  . Left heart catheterization with coronary angiogram N/A 10/20/2013    Procedure: LEFT HEART CATHETERIZATION WITH CORONARY ANGIOGRAM;  Surgeon: Troy Sine, MD;  Location: [facility] CATH LAB;  Service: Cardiovascular;  Laterality: N/A;    Social History   Social History  . Marital Status: Married    Spouse Name: N/A  . Number of Children: N/A  . Years of Education: N/A   Occupational  History  . retired    Social History Main Topics  . Smoking status: Former Research scientist (life sciences)  . Smokeless tobacco: Not on file     Comment: During WWII  . Alcohol Use: 4.2 oz/week    7 Glasses of wine per week  . Drug Use: No  . Sexual Activity: Not on file   Other Topics Concern  . 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 No pertinent family history.  ROS: no fevers or chills, productive cough, hemoptysis, dysphasia, odynophagia, melena,  hematochezia, dysuria, hematuria, rash, seizure activity, orthopnea, PND, pedal edema, claudication. Remaining systems are negative.  Physical Exam: Well-developed well-nourished in no acute distress.  Skin is warm and dry.  HEENT is normal.  Neck is supple.  Chest is clear to auscultation with normal expansion.  Cardiovascular exam is irregular Abdominal exam nontender or distended. No masses palpated. Extremities show no edema. neuro grossly intact  ECG Atrial fibrillation at a rate of 65. RV conduction delay. No significant ST changes.

## 2015-10-28 ENCOUNTER — Encounter: Payer: Self-pay | Admitting: Cardiology

## 2015-10-28 ENCOUNTER — Ambulatory Visit (INDEPENDENT_AMBULATORY_CARE_PROVIDER_SITE_OTHER): Payer: Medicare Other | Admitting: Cardiology

## 2015-10-28 VITALS — BP 142/70 | HR 65 | Ht 66.0 in | Wt 155.0 lb

## 2015-10-28 DIAGNOSIS — I10 Essential (primary) hypertension: Secondary | ICD-10-CM | POA: Diagnosis not present

## 2015-10-28 DIAGNOSIS — I2583 Coronary atherosclerosis due to lipid rich plaque: Secondary | ICD-10-CM

## 2015-10-28 DIAGNOSIS — I4891 Unspecified atrial fibrillation: Secondary | ICD-10-CM | POA: Diagnosis not present

## 2015-10-28 DIAGNOSIS — I2510 Atherosclerotic heart disease of native coronary artery without angina pectoris: Secondary | ICD-10-CM

## 2015-10-28 NOTE — Assessment & Plan Note (Signed)
Blood pressure controlled. Continue present medications. 

## 2015-10-28 NOTE — Assessment & Plan Note (Signed)
LV function improved on most recent echo. Continue beta blocker and ARB.

## 2015-10-28 NOTE — Patient Instructions (Signed)
Your physician wants you to follow-up in: ONE YEAR WITH DR CRENSHAW You will receive a reminder letter in the mail two months in advance. If you don't receive a letter, please call our office to schedule the follow-up appointment.   If you need a refill on your cardiac medications before your next appointment, please call your pharmacy.  

## 2015-10-28 NOTE — Assessment & Plan Note (Signed)
Patient remains in permanent atrial fibrillation. Continue metoprolol and Cardizem for rate control.Continue Coumadin. We discussed DOAC. He will contact us if interested but at present he would like to continue Coumadin. INR is monitored by primary care.

## 2015-10-28 NOTE — Assessment & Plan Note (Signed)
Not on aspirin given need for Coumadin. Not interested in statins.

## 2015-11-21 ENCOUNTER — Other Ambulatory Visit: Payer: Self-pay | Admitting: Cardiology

## 2015-11-21 NOTE — Telephone Encounter (Signed)
Rx request sent to pharmacy.  

## 2015-11-29 DIAGNOSIS — Z7901 Long term (current) use of anticoagulants: Secondary | ICD-10-CM | POA: Diagnosis not present

## 2015-12-26 DIAGNOSIS — Z7901 Long term (current) use of anticoagulants: Secondary | ICD-10-CM | POA: Diagnosis not present

## 2015-12-27 ENCOUNTER — Other Ambulatory Visit: Payer: Self-pay | Admitting: Cardiology

## 2015-12-27 NOTE — Telephone Encounter (Signed)
REFILL 

## 2016-01-09 DIAGNOSIS — R220 Localized swelling, mass and lump, head: Secondary | ICD-10-CM | POA: Diagnosis not present

## 2016-01-16 DIAGNOSIS — Z7901 Long term (current) use of anticoagulants: Secondary | ICD-10-CM | POA: Diagnosis not present

## 2016-01-25 DIAGNOSIS — R220 Localized swelling, mass and lump, head: Secondary | ICD-10-CM | POA: Diagnosis not present

## 2016-01-27 DIAGNOSIS — R220 Localized swelling, mass and lump, head: Secondary | ICD-10-CM | POA: Diagnosis not present

## 2016-01-27 DIAGNOSIS — D110 Benign neoplasm of parotid gland: Secondary | ICD-10-CM | POA: Diagnosis not present

## 2016-01-27 DIAGNOSIS — D369 Benign neoplasm, unspecified site: Secondary | ICD-10-CM | POA: Diagnosis not present

## 2016-02-06 ENCOUNTER — Encounter (HOSPITAL_COMMUNITY): Payer: Self-pay

## 2016-02-09 DIAGNOSIS — I10 Essential (primary) hypertension: Secondary | ICD-10-CM | POA: Diagnosis not present

## 2016-02-09 DIAGNOSIS — Z7901 Long term (current) use of anticoagulants: Secondary | ICD-10-CM | POA: Diagnosis not present

## 2016-02-09 DIAGNOSIS — E7800 Pure hypercholesterolemia, unspecified: Secondary | ICD-10-CM | POA: Diagnosis not present

## 2016-02-09 DIAGNOSIS — I482 Chronic atrial fibrillation: Secondary | ICD-10-CM | POA: Diagnosis not present

## 2016-02-09 DIAGNOSIS — Z79899 Other long term (current) drug therapy: Secondary | ICD-10-CM | POA: Diagnosis not present

## 2016-03-08 DIAGNOSIS — Z7901 Long term (current) use of anticoagulants: Secondary | ICD-10-CM | POA: Diagnosis not present

## 2016-04-05 DIAGNOSIS — Z7901 Long term (current) use of anticoagulants: Secondary | ICD-10-CM | POA: Diagnosis not present

## 2016-04-19 ENCOUNTER — Other Ambulatory Visit: Payer: Self-pay | Admitting: *Deleted

## 2016-04-19 MED ORDER — FUROSEMIDE 40 MG PO TABS: 40.0000 mg | ORAL_TABLET | Freq: Two times a day (BID) | ORAL | Status: DC

## 2016-04-19 NOTE — Telephone Encounter (Signed)
Rx(s) sent to pharmacy electronically.  

## 2016-05-02 DIAGNOSIS — L821 Other seborrheic keratosis: Secondary | ICD-10-CM | POA: Diagnosis not present

## 2016-05-02 DIAGNOSIS — D1801 Hemangioma of skin and subcutaneous tissue: Secondary | ICD-10-CM | POA: Diagnosis not present

## 2016-05-02 DIAGNOSIS — Z85828 Personal history of other malignant neoplasm of skin: Secondary | ICD-10-CM | POA: Diagnosis not present

## 2016-05-02 DIAGNOSIS — L820 Inflamed seborrheic keratosis: Secondary | ICD-10-CM | POA: Diagnosis not present

## 2016-05-02 DIAGNOSIS — L218 Other seborrheic dermatitis: Secondary | ICD-10-CM | POA: Diagnosis not present

## 2016-05-02 DIAGNOSIS — L570 Actinic keratosis: Secondary | ICD-10-CM | POA: Diagnosis not present

## 2016-05-03 DIAGNOSIS — Z7901 Long term (current) use of anticoagulants: Secondary | ICD-10-CM | POA: Diagnosis not present

## 2016-05-09 DIAGNOSIS — H35373 Puckering of macula, bilateral: Secondary | ICD-10-CM | POA: Diagnosis not present

## 2016-05-31 DIAGNOSIS — Z7901 Long term (current) use of anticoagulants: Secondary | ICD-10-CM | POA: Diagnosis not present

## 2016-06-28 DIAGNOSIS — Z7901 Long term (current) use of anticoagulants: Secondary | ICD-10-CM | POA: Diagnosis not present

## 2016-07-25 DIAGNOSIS — K119 Disease of salivary gland, unspecified: Secondary | ICD-10-CM | POA: Diagnosis not present

## 2016-07-26 DIAGNOSIS — Z7901 Long term (current) use of anticoagulants: Secondary | ICD-10-CM | POA: Diagnosis not present

## 2016-08-21 ENCOUNTER — Other Ambulatory Visit: Payer: Self-pay | Admitting: Cardiology

## 2016-08-21 ENCOUNTER — Other Ambulatory Visit: Payer: Self-pay

## 2016-08-21 MED ORDER — FUROSEMIDE 40 MG PO TABS: 40.0000 mg | ORAL_TABLET | Freq: Two times a day (BID) | ORAL | 0 refills | Status: DC

## 2016-08-23 DIAGNOSIS — Z7901 Long term (current) use of anticoagulants: Secondary | ICD-10-CM | POA: Diagnosis not present

## 2016-09-18 ENCOUNTER — Ambulatory Visit
Admission: RE | Admit: 2016-09-18 | Discharge: 2016-09-18 | Disposition: A | Discharge status: Home / Self Care | Payer: Medicare Other | Source: Ambulatory Visit | Attending: Geriatric Medicine | Admitting: Geriatric Medicine

## 2016-09-18 ENCOUNTER — Other Ambulatory Visit: Payer: Self-pay | Admitting: Geriatric Medicine

## 2016-09-18 DIAGNOSIS — J61 Pneumoconiosis due to asbestos and other mineral fibers: Secondary | ICD-10-CM

## 2016-09-18 DIAGNOSIS — Z1389 Encounter for screening for other disorder: Secondary | ICD-10-CM | POA: Diagnosis not present

## 2016-09-18 DIAGNOSIS — Z79899 Other long term (current) drug therapy: Secondary | ICD-10-CM | POA: Diagnosis not present

## 2016-09-18 DIAGNOSIS — R918 Other nonspecific abnormal finding of lung field: Secondary | ICD-10-CM | POA: Diagnosis not present

## 2016-09-18 DIAGNOSIS — Z0000 Encounter for general adult medical examination without abnormal findings: Secondary | ICD-10-CM | POA: Diagnosis not present

## 2016-09-18 DIAGNOSIS — I482 Chronic atrial fibrillation: Secondary | ICD-10-CM | POA: Diagnosis not present

## 2016-09-18 DIAGNOSIS — E7800 Pure hypercholesterolemia, unspecified: Secondary | ICD-10-CM | POA: Diagnosis not present

## 2016-09-18 DIAGNOSIS — I10 Essential (primary) hypertension: Secondary | ICD-10-CM | POA: Diagnosis not present

## 2016-09-20 ENCOUNTER — Other Ambulatory Visit: Payer: Self-pay | Admitting: Cardiology

## 2016-10-09 DIAGNOSIS — Z7901 Long term (current) use of anticoagulants: Secondary | ICD-10-CM | POA: Diagnosis not present

## 2016-10-21 ENCOUNTER — Other Ambulatory Visit: Payer: Self-pay | Admitting: Cardiology

## 2016-10-22 ENCOUNTER — Other Ambulatory Visit: Payer: Self-pay

## 2016-10-23 DIAGNOSIS — Z7901 Long term (current) use of anticoagulants: Secondary | ICD-10-CM | POA: Diagnosis not present

## 2016-10-29 DIAGNOSIS — Z7901 Long term (current) use of anticoagulants: Secondary | ICD-10-CM | POA: Diagnosis not present

## 2016-10-31 NOTE — Progress Notes (Signed)
HPI: FU coronary artery disease and atrial fibrillation. Cardiac catheterization in January of 2015 revealed severe coronary artery disease including an 80-90% distal left main. Ejection fraction was 35%. Preoperative carotid Dopplers in January of 2015 showed 1-39% bilateral stenosis. The patient underwent coronary artery bypassing graft with a LIMA to the LAD, saphenous vein graft to the obtuse marginal and circumflex and saphenous vein graft to the right coronary artery. He also had placement of atrial clip. He elected rate control and anticoagulation for afib. Holter monitor in February of 2015 revealed atrial fibrillation with controlled ventricular response. Echocardiogram repeated in July 2015 and showed ejection fraction 50-55%, moderate left atrial enlargement and trace aortic insufficiency. Since he was last seen he denies dyspnea, chest pain, palpitations, syncope or bleeding.  Current Outpatient Prescriptions  Medication Sig Dispense Refill  . diltiazem (CARDIZEM CD) 240 MG 24 hr capsule Take 240 mg by mouth daily.    . furosemide (LASIX) 40 MG tablet TAKE 1 TABLET BY MOUTH TWICE DAILY 60 tablet 0  . losartan (COZAAR) 25 MG tablet TAKE 1 TABLET BY MOUTH EVERY DAY 90 tablet 3  . metoprolol succinate (TOPROL-XL) 50 MG 24 hr tablet TAKE 1 TABLET BY MOUTH DAILY WITH OR IMMEDIATELY FOLLOWING A MEAL 90 tablet 3  . Multiple Vitamin (MULTIVITAMIN) tablet Take 1 tablet by mouth daily.     . potassium chloride SA (K-DUR,KLOR-CON) 20 MEQ tablet TAKE 1 TABLET BY MOUTH DAILY 90 tablet 3  . warfarin (COUMADIN) 2 MG tablet Take 3 mg by mouth daily. OR AS DIRECTED BY COUMADIN CLINIC 4 mg 2 days then 3 mg 1 day alternating     No current facility-administered medications for this visit.      Past Medical History:  Diagnosis Date  . Atrial fibrillation ([place])    with rapid ventricular repsonse  . Bone lesion    lytic  . Debility   . HTN (hypertension)   . S/P CABG x 4 with atrial clip  10/21/2013  . Small bowel obstruction     Past Surgical History:  Procedure Laterality Date  . APPENDECTOMY    . CLIPPING OF ATRIAL APPENDAGE  10/21/2013   Procedure: CLIPPING OF ATRIAL APPENDAGE;  Surgeon: Grace Isaac, MD;  Location: [place];  Service: Open Heart Surgery;;  . CORONARY ARTERY BYPASS GRAFT N/A 10/21/2013   Procedure: CORONARY ARTERY BYPASS GRAFTING (CABG);  Surgeon: Grace Isaac, MD;  Location: [place];  Service: Open Heart Surgery;  Laterality: N/A;  . HERNIA REPAIR     x2  . INTRAOPERATIVE TRANSESOPHAGEAL ECHOCARDIOGRAM N/A 10/21/2013   Procedure: INTRAOPERATIVE TRANSESOPHAGEAL ECHOCARDIOGRAM;  Surgeon: Grace Isaac, MD;  Location: [place];  Service: Open Heart Surgery;  Laterality: N/A;  . LEFT HEART CATHETERIZATION WITH CORONARY ANGIOGRAM N/A 10/20/2013   Procedure: LEFT HEART CATHETERIZATION WITH CORONARY ANGIOGRAM;  Surgeon: Troy Sine, MD;  Location: [facility] CATH LAB;  Service: Cardiovascular;  Laterality: N/A;  . PARATHYROIDECTOMY      Social History   Social History  . Marital status: Married    Spouse name: N/A  . Number of children: N/A  . Years of education: N/A   Occupational History  . retired Retired   Social History Main Topics  . Smoking status: Former Research scientist (life sciences)  . Smokeless tobacco: Never Used     Comment: During WWII  . Alcohol use 4.2 oz/week    7 Glasses of wine per week  . Drug use: No  . Sexual activity:  Not on file   Other Topics Concern  . 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 No pertinent family history.  ROS: no fevers or chills, productive cough, hemoptysis, dysphasia, odynophagia, melena, hematochezia, dysuria, hematuria, rash, seizure activity, orthopnea, PND, pedal edema, claudication. Remaining systems are negative.  Physical Exam: Well-developed well-nourished in no acute distress.  Skin is warm and dry.  HEENT is normal.  Neck is supple.  Chest is clear  to auscultation with normal expansion.  Cardiovascular exam is irregular Abdominal exam nontender or distended. No masses palpated. Extremities show no edema. neuro grossly intact  ECG-Atrial fibrillation at a rate of 66. Left axis deviation. Incomplete right bundle branch block.  A/P  1 permanent atrial fibrillation-continue Cardizem and metoprolol for rate control. Continue Coumadin. INR is monitored by primary care. Hemoglobin also monitored by primary care.   2 coronary artery disease-patient not interested in statins. No aspirin given need for Coumadin.  3 hypertension-blood pressure borderline. Continue present medications and follow.Potassium and renal function monitored by primary care.   4 ischemic cardiomyopathy-LV function improved on most recent echocardiogram. Continue beta blocker and ARB.  Kirk Ruths, MD

## 2016-11-05 DIAGNOSIS — Z7901 Long term (current) use of anticoagulants: Secondary | ICD-10-CM | POA: Diagnosis not present

## 2016-11-08 ENCOUNTER — Encounter: Payer: Self-pay | Admitting: Cardiology

## 2016-11-08 ENCOUNTER — Ambulatory Visit (INDEPENDENT_AMBULATORY_CARE_PROVIDER_SITE_OTHER): Payer: Medicare Other | Admitting: Cardiology

## 2016-11-08 ENCOUNTER — Other Ambulatory Visit: Payer: Self-pay | Admitting: Cardiology

## 2016-11-08 VITALS — BP 152/80 | HR 66 | Ht 66.0 in | Wt 145.0 lb

## 2016-11-08 DIAGNOSIS — I4891 Unspecified atrial fibrillation: Secondary | ICD-10-CM | POA: Diagnosis not present

## 2016-11-08 DIAGNOSIS — I10 Essential (primary) hypertension: Secondary | ICD-10-CM | POA: Diagnosis not present

## 2016-11-08 DIAGNOSIS — I2510 Atherosclerotic heart disease of native coronary artery without angina pectoris: Secondary | ICD-10-CM

## 2016-11-08 NOTE — Telephone Encounter (Signed)
Rx request sent to pharmacy.  

## 2016-11-08 NOTE — Patient Instructions (Signed)
Your physician wants you to follow-up in: ONE YEAR WITH DR CRENSHAW You will receive a reminder letter in the mail two months in advance. If you don't receive a letter, please call our office to schedule the follow-up appointment.   If you need a refill on your cardiac medications before your next appointment, please call your pharmacy.  

## 2016-11-19 ENCOUNTER — Other Ambulatory Visit: Payer: Self-pay | Admitting: Cardiology

## 2016-11-19 DIAGNOSIS — Z7901 Long term (current) use of anticoagulants: Secondary | ICD-10-CM | POA: Diagnosis not present

## 2016-12-03 DIAGNOSIS — Z7901 Long term (current) use of anticoagulants: Secondary | ICD-10-CM | POA: Diagnosis not present

## 2016-12-10 DIAGNOSIS — Z7901 Long term (current) use of anticoagulants: Secondary | ICD-10-CM | POA: Diagnosis not present

## 2016-12-19 ENCOUNTER — Other Ambulatory Visit: Payer: Self-pay | Admitting: Cardiology

## 2016-12-20 ENCOUNTER — Other Ambulatory Visit: Payer: Self-pay | Admitting: Cardiology

## 2016-12-20 NOTE — Telephone Encounter (Signed)
Rx(s) sent to pharmacy electronically.  

## 2016-12-31 DIAGNOSIS — Z7901 Long term (current) use of anticoagulants: Secondary | ICD-10-CM | POA: Diagnosis not present

## 2017-01-08 DIAGNOSIS — C4442 Squamous cell carcinoma of skin of scalp and neck: Secondary | ICD-10-CM | POA: Diagnosis not present

## 2017-01-08 DIAGNOSIS — D485 Neoplasm of uncertain behavior of skin: Secondary | ICD-10-CM | POA: Diagnosis not present

## 2017-01-08 DIAGNOSIS — C44329 Squamous cell carcinoma of skin of other parts of face: Secondary | ICD-10-CM | POA: Diagnosis not present

## 2017-01-08 DIAGNOSIS — Z85828 Personal history of other malignant neoplasm of skin: Secondary | ICD-10-CM | POA: Diagnosis not present

## 2017-01-16 DIAGNOSIS — Z7901 Long term (current) use of anticoagulants: Secondary | ICD-10-CM | POA: Diagnosis not present

## 2017-01-23 DIAGNOSIS — C44329 Squamous cell carcinoma of skin of other parts of face: Secondary | ICD-10-CM | POA: Diagnosis not present

## 2017-01-23 DIAGNOSIS — Z85828 Personal history of other malignant neoplasm of skin: Secondary | ICD-10-CM | POA: Diagnosis not present

## 2017-01-26 ENCOUNTER — Ambulatory Visit (INDEPENDENT_AMBULATORY_CARE_PROVIDER_SITE_OTHER): Payer: Medicare Other | Admitting: Physician Assistant

## 2017-01-26 VITALS — BP 118/60 | HR 77 | Temp 97.7°F | Resp 18

## 2017-01-26 DIAGNOSIS — S0180XA Unspecified open wound of other part of head, initial encounter: Secondary | ICD-10-CM

## 2017-01-26 NOTE — Progress Notes (Signed)
   [name]  MRN: [id number] DOB: 06/23/1918  PCP: Mathews Argyle, MD  Chief Complaint  Patient presents with  . Wound Check    stitches on forehead x3days    Subjective:  Pt presents to clinic for wound dressing change.  He had a lesion removed on 4/11 - he had the dressing changed yesterday and is supposed to have it changed daily now.  He lives at a retirement center that has a Marine scientist during the week but not on the weeks.  He is having no problems with the area but it is starting to itch slightly.    Review of Systems  Constitutional: Negative for chills and fever.    Patient Active Problem List   Diagnosis Date Noted  . Swelling in head/neck   . CAD (coronary artery disease) 11/30/2013  . PAF history 10/27/2013  . Chronic anticoagulation 10/27/2013  . Cardiomyopathy, ischemic- EF 35-40% echo 10/19/12 10/27/2013  . S/P CABG x 4 with atrial clip 10/21/2013  . Left main coronary artery disease 10/20/2013  . Hx of asbestosis 10/20/2013  . NSTEMI (non-ST elevated myocardial infarction) ([place]) 10/17/2013  . Essential hypertension, benign 08/21/2010  . Atrial fibrillation with rapid ventricular response on adm 10/17/13 08/21/2010    Current Outpatient Prescriptions on File Prior to Visit  Medication Sig Dispense Refill  . diltiazem (CARDIZEM CD) 240 MG 24 hr capsule Take 240 mg by mouth daily.    . furosemide (LASIX) 40 MG tablet TAKE 1 TABLET BY MOUTH TWICE DAILY 60 tablet 11  . losartan (COZAAR) 25 MG tablet TAKE 1 TABLET BY MOUTH EVERY DAY 90 tablet 1  . metoprolol succinate (TOPROL-XL) 50 MG 24 hr tablet TAKE 1 TABLET BY MOUTH DAILY WITH OR IMMEDIATELY FOLLOWING A MEAL 90 tablet 1  . Multiple Vitamin (MULTIVITAMIN) tablet Take 1 tablet by mouth daily.     . potassium chloride SA (K-DUR,KLOR-CON) 20 MEQ tablet TAKE 1 TABLET BY MOUTH DAILY 90 tablet 3  . warfarin (COUMADIN) 2 MG tablet Take 3 mg by mouth daily. OR AS DIRECTED BY COUMADIN CLINIC 4 mg 2 days then 3 mg 1  day alternating     No current facility-administered medications on file prior to visit.     No Known Allergies  Pt patients past, family and social history were reviewed and updated.   Objective:  BP 118/60   Pulse 77   Temp 97.7 F (36.5 C)   Resp 18   SpO2 95%   Physical Exam  Constitutional: He is oriented to person, place, and time and well-developed, well-nourished, and in no distress.  HENT:  Head: Normocephalic and atraumatic.  Right Ear: External ear normal.  Left Ear: External ear normal.  Eyes: Conjunctivae are normal.  Neck: Normal range of motion.  Pulmonary/Chest: Effort normal.  Neurological: He is alert and oriented to person, place, and time. Gait normal.  Skin: Skin is warm and dry.  Psychiatric: Mood, memory, affect and judgment normal.   Healing wound on left temple area - stitches in place without any signs of infection Assessment and Plan :  Open wound of face, initial encounter - bandage change -keep clean and dry and ok to not change dressing tomorrow.  Windell Hummingbird PA-C  [facility] 01/26/2017 2:44 PM

## 2017-01-26 NOTE — Patient Instructions (Signed)
     IF you received an x-ray today, you will receive an invoice from [facility]. Please contact  Radiology at [phone] with questions or concerns regarding your invoice.   IF you received labwork today, you will receive an invoice from [facility]. Please contact [facility] at [phone] with questions or concerns regarding your invoice.   Our billing staff will not be able to assist you with questions regarding bills from these companies.  You will be contacted with the lab results as soon as they are available. The fastest way to get your results is to activate your My Chart account. Instructions are located on the last page of this paperwork. If you have not heard from us regarding the results in 2 weeks, please contact this office.     

## 2017-02-13 DIAGNOSIS — Z7901 Long term (current) use of anticoagulants: Secondary | ICD-10-CM | POA: Diagnosis not present

## 2017-02-27 DIAGNOSIS — Z7901 Long term (current) use of anticoagulants: Secondary | ICD-10-CM | POA: Diagnosis not present

## 2017-03-04 DIAGNOSIS — Z7901 Long term (current) use of anticoagulants: Secondary | ICD-10-CM | POA: Diagnosis not present

## 2017-03-12 DIAGNOSIS — Z7901 Long term (current) use of anticoagulants: Secondary | ICD-10-CM | POA: Diagnosis not present

## 2017-03-26 DIAGNOSIS — Z79899 Other long term (current) drug therapy: Secondary | ICD-10-CM | POA: Diagnosis not present

## 2017-03-26 DIAGNOSIS — Z7901 Long term (current) use of anticoagulants: Secondary | ICD-10-CM | POA: Diagnosis not present

## 2017-03-26 DIAGNOSIS — R300 Dysuria: Secondary | ICD-10-CM | POA: Diagnosis not present

## 2017-03-26 DIAGNOSIS — I10 Essential (primary) hypertension: Secondary | ICD-10-CM | POA: Diagnosis not present

## 2017-04-01 DIAGNOSIS — Z7901 Long term (current) use of anticoagulants: Secondary | ICD-10-CM | POA: Diagnosis not present

## 2017-04-05 DIAGNOSIS — Z7901 Long term (current) use of anticoagulants: Secondary | ICD-10-CM | POA: Diagnosis not present

## 2017-04-10 DIAGNOSIS — Z961 Presence of intraocular lens: Secondary | ICD-10-CM | POA: Diagnosis not present

## 2017-04-16 DIAGNOSIS — Z7901 Long term (current) use of anticoagulants: Secondary | ICD-10-CM | POA: Diagnosis not present

## 2017-05-01 DIAGNOSIS — L570 Actinic keratosis: Secondary | ICD-10-CM | POA: Diagnosis not present

## 2017-05-01 DIAGNOSIS — L821 Other seborrheic keratosis: Secondary | ICD-10-CM | POA: Diagnosis not present

## 2017-05-01 DIAGNOSIS — Z85828 Personal history of other malignant neoplasm of skin: Secondary | ICD-10-CM | POA: Diagnosis not present

## 2017-05-02 DIAGNOSIS — Z7901 Long term (current) use of anticoagulants: Secondary | ICD-10-CM | POA: Diagnosis not present

## 2017-05-09 DIAGNOSIS — Z79899 Other long term (current) drug therapy: Secondary | ICD-10-CM | POA: Diagnosis not present

## 2017-05-09 DIAGNOSIS — I10 Essential (primary) hypertension: Secondary | ICD-10-CM | POA: Diagnosis not present

## 2017-05-26 ENCOUNTER — Other Ambulatory Visit: Payer: Self-pay | Admitting: Cardiology

## 2017-05-30 DIAGNOSIS — Z79899 Other long term (current) drug therapy: Secondary | ICD-10-CM | POA: Diagnosis not present

## 2017-05-30 DIAGNOSIS — Z7901 Long term (current) use of anticoagulants: Secondary | ICD-10-CM | POA: Diagnosis not present

## 2017-06-13 DIAGNOSIS — Z7901 Long term (current) use of anticoagulants: Secondary | ICD-10-CM | POA: Diagnosis not present

## 2017-06-17 ENCOUNTER — Other Ambulatory Visit: Payer: Self-pay | Admitting: Cardiology

## 2017-06-18 NOTE — Telephone Encounter (Signed)
REFILL 

## 2017-07-11 DIAGNOSIS — Z7901 Long term (current) use of anticoagulants: Secondary | ICD-10-CM | POA: Diagnosis not present

## 2017-07-29 DIAGNOSIS — K119 Disease of salivary gland, unspecified: Secondary | ICD-10-CM | POA: Diagnosis not present

## 2017-08-01 DIAGNOSIS — Z7901 Long term (current) use of anticoagulants: Secondary | ICD-10-CM | POA: Diagnosis not present

## 2017-08-29 DIAGNOSIS — Z7901 Long term (current) use of anticoagulants: Secondary | ICD-10-CM | POA: Diagnosis not present

## 2017-09-03 DIAGNOSIS — I482 Chronic atrial fibrillation: Secondary | ICD-10-CM | POA: Diagnosis not present

## 2017-09-03 DIAGNOSIS — I10 Essential (primary) hypertension: Secondary | ICD-10-CM | POA: Diagnosis not present

## 2017-09-19 DIAGNOSIS — Z7901 Long term (current) use of anticoagulants: Secondary | ICD-10-CM | POA: Diagnosis not present

## 2017-09-20 DIAGNOSIS — Z0000 Encounter for general adult medical examination without abnormal findings: Secondary | ICD-10-CM | POA: Diagnosis not present

## 2017-09-20 DIAGNOSIS — I482 Chronic atrial fibrillation: Secondary | ICD-10-CM | POA: Diagnosis not present

## 2017-09-20 DIAGNOSIS — J61 Pneumoconiosis due to asbestos and other mineral fibers: Secondary | ICD-10-CM | POA: Diagnosis not present

## 2017-09-20 DIAGNOSIS — I10 Essential (primary) hypertension: Secondary | ICD-10-CM | POA: Diagnosis not present

## 2017-10-03 DIAGNOSIS — Z79899 Other long term (current) drug therapy: Secondary | ICD-10-CM | POA: Diagnosis not present

## 2017-10-03 DIAGNOSIS — Z7901 Long term (current) use of anticoagulants: Secondary | ICD-10-CM | POA: Diagnosis not present

## 2017-10-31 DIAGNOSIS — Z7901 Long term (current) use of anticoagulants: Secondary | ICD-10-CM | POA: Diagnosis not present

## 2017-11-07 DIAGNOSIS — Z7901 Long term (current) use of anticoagulants: Secondary | ICD-10-CM | POA: Diagnosis not present

## 2017-11-14 ENCOUNTER — Other Ambulatory Visit: Payer: Self-pay | Admitting: Cardiology

## 2017-11-14 NOTE — Telephone Encounter (Signed)
REFILL 

## 2017-12-05 DIAGNOSIS — Z7901 Long term (current) use of anticoagulants: Secondary | ICD-10-CM | POA: Diagnosis not present

## 2017-12-17 ENCOUNTER — Other Ambulatory Visit: Payer: Self-pay | Admitting: Cardiology

## 2017-12-19 DIAGNOSIS — Z7901 Long term (current) use of anticoagulants: Secondary | ICD-10-CM | POA: Diagnosis not present

## 2017-12-24 DIAGNOSIS — L570 Actinic keratosis: Secondary | ICD-10-CM | POA: Diagnosis not present

## 2017-12-24 DIAGNOSIS — L821 Other seborrheic keratosis: Secondary | ICD-10-CM | POA: Diagnosis not present

## 2017-12-24 DIAGNOSIS — Z85828 Personal history of other malignant neoplasm of skin: Secondary | ICD-10-CM | POA: Diagnosis not present

## 2017-12-24 DIAGNOSIS — D1801 Hemangioma of skin and subcutaneous tissue: Secondary | ICD-10-CM | POA: Diagnosis not present

## 2018-01-04 ENCOUNTER — Other Ambulatory Visit: Payer: Self-pay | Admitting: Cardiology

## 2018-01-06 ENCOUNTER — Other Ambulatory Visit: Payer: Self-pay | Admitting: Cardiology

## 2018-01-16 DIAGNOSIS — Z7901 Long term (current) use of anticoagulants: Secondary | ICD-10-CM | POA: Diagnosis not present

## 2018-02-11 ENCOUNTER — Other Ambulatory Visit: Payer: Self-pay | Admitting: Cardiology

## 2018-02-13 DIAGNOSIS — Z7901 Long term (current) use of anticoagulants: Secondary | ICD-10-CM | POA: Diagnosis not present

## 2018-02-27 DIAGNOSIS — Z7901 Long term (current) use of anticoagulants: Secondary | ICD-10-CM | POA: Diagnosis not present

## 2018-03-13 DIAGNOSIS — Z7901 Long term (current) use of anticoagulants: Secondary | ICD-10-CM | POA: Diagnosis not present

## 2018-03-17 ENCOUNTER — Other Ambulatory Visit: Payer: Self-pay | Admitting: *Deleted

## 2018-03-18 MED ORDER — POTASSIUM CHLORIDE CRYS ER 20 MEQ PO TBCR: 20.0000 meq | EXTENDED_RELEASE_TABLET | Freq: Every day | ORAL | 0 refills | Status: DC

## 2018-03-21 DIAGNOSIS — Z79899 Other long term (current) drug therapy: Secondary | ICD-10-CM | POA: Diagnosis not present

## 2018-03-21 DIAGNOSIS — I482 Chronic atrial fibrillation: Secondary | ICD-10-CM | POA: Diagnosis not present

## 2018-03-21 DIAGNOSIS — I10 Essential (primary) hypertension: Secondary | ICD-10-CM | POA: Diagnosis not present

## 2018-04-10 DIAGNOSIS — Z7901 Long term (current) use of anticoagulants: Secondary | ICD-10-CM | POA: Diagnosis not present

## 2018-04-10 DIAGNOSIS — Z79899 Other long term (current) drug therapy: Secondary | ICD-10-CM | POA: Diagnosis not present

## 2018-05-01 DIAGNOSIS — Z7901 Long term (current) use of anticoagulants: Secondary | ICD-10-CM | POA: Diagnosis not present

## 2018-05-04 ENCOUNTER — Other Ambulatory Visit: Payer: Self-pay | Admitting: Cardiology

## 2018-05-09 ENCOUNTER — Other Ambulatory Visit: Payer: Self-pay | Admitting: Cardiology

## 2018-05-09 NOTE — Telephone Encounter (Signed)
Rx request sent to pharmacy.  

## 2018-05-29 DIAGNOSIS — Z7901 Long term (current) use of anticoagulants: Secondary | ICD-10-CM | POA: Diagnosis not present

## 2018-06-14 ENCOUNTER — Other Ambulatory Visit: Payer: Self-pay | Admitting: Cardiology

## 2018-06-26 DIAGNOSIS — Z7901 Long term (current) use of anticoagulants: Secondary | ICD-10-CM | POA: Diagnosis not present

## 2018-07-03 ENCOUNTER — Encounter (HOSPITAL_COMMUNITY): Payer: Self-pay | Admitting: Emergency Medicine

## 2018-07-03 ENCOUNTER — Emergency Department (HOSPITAL_COMMUNITY): Payer: Medicare Other

## 2018-07-03 ENCOUNTER — Emergency Department (HOSPITAL_COMMUNITY)
Admission: EM | Admit: 2018-07-03 | Discharge: 2018-07-04 | Disposition: A | Discharge status: Home / Self Care | Payer: Medicare Other | Attending: Emergency Medicine | Admitting: Emergency Medicine

## 2018-07-03 ENCOUNTER — Other Ambulatory Visit: Payer: Self-pay

## 2018-07-03 DIAGNOSIS — S0083XA Contusion of other part of head, initial encounter: Secondary | ICD-10-CM | POA: Diagnosis not present

## 2018-07-03 DIAGNOSIS — Y998 Other external cause status: Secondary | ICD-10-CM | POA: Insufficient documentation

## 2018-07-03 DIAGNOSIS — Z87891 Personal history of nicotine dependence: Secondary | ICD-10-CM | POA: Diagnosis not present

## 2018-07-03 DIAGNOSIS — Z79899 Other long term (current) drug therapy: Secondary | ICD-10-CM | POA: Diagnosis not present

## 2018-07-03 DIAGNOSIS — I2510 Atherosclerotic heart disease of native coronary artery without angina pectoris: Secondary | ICD-10-CM | POA: Insufficient documentation

## 2018-07-03 DIAGNOSIS — W19XXXA Unspecified fall, initial encounter: Secondary | ICD-10-CM

## 2018-07-03 DIAGNOSIS — Z7901 Long term (current) use of anticoagulants: Secondary | ICD-10-CM | POA: Diagnosis not present

## 2018-07-03 DIAGNOSIS — Z23 Encounter for immunization: Secondary | ICD-10-CM | POA: Insufficient documentation

## 2018-07-03 DIAGNOSIS — Z951 Presence of aortocoronary bypass graft: Secondary | ICD-10-CM | POA: Insufficient documentation

## 2018-07-03 DIAGNOSIS — I4891 Unspecified atrial fibrillation: Secondary | ICD-10-CM | POA: Diagnosis not present

## 2018-07-03 DIAGNOSIS — S51011A Laceration without foreign body of right elbow, initial encounter: Secondary | ICD-10-CM

## 2018-07-03 DIAGNOSIS — S0990XA Unspecified injury of head, initial encounter: Secondary | ICD-10-CM | POA: Diagnosis not present

## 2018-07-03 DIAGNOSIS — Y9389 Activity, other specified: Secondary | ICD-10-CM | POA: Diagnosis not present

## 2018-07-03 DIAGNOSIS — S0003XA Contusion of scalp, initial encounter: Secondary | ICD-10-CM | POA: Diagnosis not present

## 2018-07-03 DIAGNOSIS — I252 Old myocardial infarction: Secondary | ICD-10-CM | POA: Insufficient documentation

## 2018-07-03 DIAGNOSIS — W0110XA Fall on same level from slipping, tripping and stumbling with subsequent striking against unspecified object, initial encounter: Secondary | ICD-10-CM | POA: Insufficient documentation

## 2018-07-03 DIAGNOSIS — R404 Transient alteration of awareness: Secondary | ICD-10-CM | POA: Diagnosis not present

## 2018-07-03 DIAGNOSIS — I10 Essential (primary) hypertension: Secondary | ICD-10-CM | POA: Insufficient documentation

## 2018-07-03 DIAGNOSIS — S199XXA Unspecified injury of neck, initial encounter: Secondary | ICD-10-CM | POA: Diagnosis not present

## 2018-07-03 DIAGNOSIS — R410 Disorientation, unspecified: Secondary | ICD-10-CM | POA: Diagnosis not present

## 2018-07-03 DIAGNOSIS — I4510 Unspecified right bundle-branch block: Secondary | ICD-10-CM | POA: Diagnosis not present

## 2018-07-03 DIAGNOSIS — Y92093 Driveway of other non-institutional residence as the place of occurrence of the external cause: Secondary | ICD-10-CM | POA: Insufficient documentation

## 2018-07-03 DIAGNOSIS — I959 Hypotension, unspecified: Secondary | ICD-10-CM | POA: Diagnosis not present

## 2018-07-03 NOTE — ED Provider Notes (Signed)
[facility] EMERGENCY DEPARTMENT Provider Note   CSN: [id number] Arrival date & time: 07/03/18  2138     History   Chief Complaint Chief Complaint  Patient presents with  . Fall    HPI [name] is a 84 y.o. male brought in by EMS for alcohol intoxication and fall.  Apparently patient had an entire bottle of wine tonight and was outside in his driveway stumbling around and lost his footing and landed on his right arm and hit his head.  We are unsure if the patient has baseline dementia however he is clearly confused.  Patient continually asking "where is all this blood coming from?" He is on chronic Coumadin for atrial fibrillation.  HPI  Past Medical History:  Diagnosis Date  . Atrial fibrillation ([place])    with rapid ventricular repsonse  . Bone lesion    lytic  . Debility   . HTN (hypertension)   . S/P CABG x 4 with atrial clip 10/21/2013  . Small bowel obstruction [facility])     Patient Active Problem List   Diagnosis Date Noted  . Swelling in head/neck   . CAD (coronary artery disease) 11/30/2013  . PAF history 10/27/2013  . Chronic anticoagulation 10/27/2013  . Cardiomyopathy, ischemic- EF 35-40% echo 10/19/12 10/27/2013  . S/P CABG x 4 with atrial clip 10/21/2013  . Left main coronary artery disease 10/20/2013  . Hx of asbestosis 10/20/2013  . NSTEMI (non-ST elevated myocardial infarction) ([place]) 10/17/2013  . Essential hypertension, benign 08/21/2010  . Atrial fibrillation with rapid ventricular response on adm 10/17/13 08/21/2010    Past Surgical History:  Procedure Laterality Date  . APPENDECTOMY    . CLIPPING OF ATRIAL APPENDAGE  10/21/2013   Procedure: CLIPPING OF ATRIAL APPENDAGE;  Surgeon: Grace Isaac, MD;  Location: [place];  Service: Open Heart Surgery;;  . CORONARY ARTERY BYPASS GRAFT N/A 10/21/2013   Procedure: CORONARY ARTERY BYPASS GRAFTING (CABG);  Surgeon: Grace Isaac, MD;  Location: [place];  Service: Open Heart Surgery;   Laterality: N/A;  . HERNIA REPAIR     x2  . INTRAOPERATIVE TRANSESOPHAGEAL ECHOCARDIOGRAM N/A 10/21/2013   Procedure: INTRAOPERATIVE TRANSESOPHAGEAL ECHOCARDIOGRAM;  Surgeon: Grace Isaac, MD;  Location: [place];  Service: Open Heart Surgery;  Laterality: N/A;  . LEFT HEART CATHETERIZATION WITH CORONARY ANGIOGRAM N/A 10/20/2013   Procedure: LEFT HEART CATHETERIZATION WITH CORONARY ANGIOGRAM;  Surgeon: Troy Sine, MD;  Location: [facility] CATH LAB;  Service: Cardiovascular;  Laterality: N/A;  . PARATHYROIDECTOMY          Home Medications    Prior to Admission medications   Medication Sig Start Date End Date Taking? Authorizing Provider  diltiazem (CARDIZEM CD) 240 MG 24 hr capsule Take 240 mg by mouth daily.    [provider]  furosemide (LASIX) 40 MG tablet Take 1 tablet (40 mg total) by mouth 2 (two) times daily. NEED OV. 05/05/18   Lelon Perla, MD  losartan (COZAAR) 25 MG tablet TAKE 1 TABLET BY MOUTH EVERY DAY 06/18/17   Lelon Perla, MD  metoprolol succinate (TOPROL-XL) 50 MG 24 hr tablet TAKE 1 TABLET BY MOUTH EVERY DAY WITH OR IMMEDIATELY FOLLOWING A MEAL. MUST BE SEEN FOR REFILLS 05/09/18   Lelon Perla, MD  Multiple Vitamin (MULTIVITAMIN) tablet Take 1 tablet by mouth daily.     [provider]  potassium chloride SA (K-DUR,KLOR-CON) 20 MEQ tablet Take 1 tablet (20 mEq total) by mouth daily. NEED  OV. 06/17/18   Lelon Perla, MD  warfarin (COUMADIN) 2 MG tablet Take 3 mg by mouth daily. OR AS DIRECTED BY COUMADIN CLINIC 4 mg 2 days then 3 mg 1 day alternating    [provider]    Family History No family history on file.  Social History Social History   Tobacco Use  . Smoking status: Former Research scientist (life sciences)  . Smokeless tobacco: Never Used  . Tobacco comment: During WWII  Substance Use Topics  . Alcohol use: Yes    Alcohol/week: 7.0 standard drinks    Types: 7 Glasses of wine per week  . Drug use: No     Allergies   Norvasc [amlodipine  besylate]; Symmetrel [amantadine]; and Hydrochlorothiazide   Review of Systems Review of Systems  Unable to review systems due to altered mental status Physical Exam Updated Vital Signs BP 135/83   Pulse 78   Temp (!) 97.5 F (36.4 C) (Oral)   Resp 13   SpO2 96%   Physical Exam  Constitutional: He appears well-developed and well-nourished. No distress.  HENT:  Head: Normocephalic.  Large hematoma on the right frontal scalp with abrasion.  No active bleeding  Eyes: Conjunctivae are normal. No scleral icterus.  Neck: Normal range of motion. Neck supple.  Cardiovascular: Normal rate, regular rhythm and normal heart sounds.  Pulmonary/Chest: Effort normal and breath sounds normal. No respiratory distress.  Abdominal: Soft. There is no tenderness.  Musculoskeletal: He exhibits no edema.  Neurological: He is alert.  Skin: Skin is warm and dry. He is not diaphoretic.  Psychiatric: His behavior is normal.  Nursing note and vitals reviewed.    ED Treatments / Results  Labs (all labs ordered are listed, but only abnormal results are displayed) Labs Reviewed - No data to display  EKG EKG Interpretation  Date/Time:  Thursday July 03 2018 21:46:02 EDT Ventricular Rate:  70 PR Interval:    QRS Duration: 122 QT Interval:  439 QTC Calculation: 474 R Axis:   -31 Text Interpretation:  Atrial fibrillation Right bundle branch block Confirmed by Palumbo, April ([id number]) on 07/03/2018 11:45:20 PM   Radiology No results found.  Procedures Procedures (including critical care time)  Medications Ordered in ED Medications  Tdap (BOOSTRIX) injection 0.5 mL (0.5 mLs Intramuscular Given 07/04/18 0040)     Initial Impression / Assessment and Plan / ED Course  I have reviewed the triage vital signs and the nursing notes.  Pertinent labs & imaging results that were available during my care of the patient were reviewed by me and considered in my medical decision making (see chart  for details).    Patient here with alcohol intoxication.  Seen and shared visit with Palombo.  Patient scalp was cleaned up.  He is able to ambulate here in the emergency department.  Personally reviewed the patient's CT head and C-spine which showed no significant or acute abnormalities.  He denies on anticoagulation for rate controlled A. fib.  His EKG shows rate controlled A. fib.   Final Clinical Impressions(s) / ED Diagnoses   Final diagnoses:  Fall, initial encounter  Traumatic hematoma of forehead, initial encounter  Skin tear of right elbow without complication, initial encounter    ED Discharge Orders    None       Margarita Mail, PA-C 07/05/18 2326    Palumbo, April, MD 07/06/18 0003

## 2018-07-03 NOTE — ED Triage Notes (Signed)
Pt to ED from [facility] where he lives independently with his wife. Pt drank bottle of wine tonight and didn't eat dinner. Baseline mental status is unclear but potentially a little bit of confusion. Pt was out in the driveway stumbling around d/t intoxication and had witnessed fall, hitting head on concrete driveway. Skin tears to arms, hematoma and abrasion to forehead. No LOC. C collar in place. On coumadin for afib.

## 2018-07-04 DIAGNOSIS — R279 Unspecified lack of coordination: Secondary | ICD-10-CM | POA: Diagnosis not present

## 2018-07-04 DIAGNOSIS — Z743 Need for continuous supervision: Secondary | ICD-10-CM | POA: Diagnosis not present

## 2018-07-04 MED ORDER — TETANUS-DIPHTH-ACELL PERTUSSIS 5-2.5-18.5 LF-MCG/0.5 IM SUSP
0.5000 mL | Freq: Once | INTRAMUSCULAR | Status: AC
  Administered 2018-07-04: 0.5 mL via INTRAMUSCULAR
  Filled 2018-07-04: qty 0.5

## 2018-07-04 NOTE — ED Notes (Signed)
Patient verbalizes understanding of discharge instructions. Opportunity for questioning and answers were provided. Armband removed by staff, pt discharged from ED via [place] back to [facility].

## 2018-07-04 NOTE — Discharge Instructions (Addendum)
Contact a health care provider if: You do not feel better after a few days. You are having problems at work, at school, or at home due to drinking. Get help right away if: You become shaky when you try to stop drinking. You shake uncontrollably (have a seizure). You vomit blood. Blood in vomit may look bright red, or it may look like coffee grounds. You have blood in your stool. Blood in stool may be bright red, or it may make stool appear black and tarry and make it smell bad. You become light-headed or you faint.

## 2018-07-04 NOTE — ED Notes (Signed)
[facility] called for transport.  

## 2018-07-04 NOTE — ED Provider Notes (Signed)
Medical screening examination/treatment/procedure(s) were conducted as a shared visit with non-physician practitioner(s) and myself.  I personally evaluated the patient during the encounter.  EKG Interpretation  Date/Time:  Thursday July 03 2018 21:46:02 EDT Ventricular Rate:  70 PR Interval:    QRS Duration: 122 QT Interval:  439 QTC Calculation: 474 R Axis:   -31 Text Interpretation:  Atrial fibrillation Right bundle branch block Confirmed by Alfrieda Tarry ([id number]) on 07/03/2018 11:45:20 PM  Cephalohematoma PERRL RRR CTAB NABS  Plan d/c as CT negative   Alysabeth Scalia, MD 07/04/18 [id number]

## 2018-07-08 DIAGNOSIS — I10 Essential (primary) hypertension: Secondary | ICD-10-CM | POA: Diagnosis not present

## 2018-07-08 DIAGNOSIS — I482 Chronic atrial fibrillation: Secondary | ICD-10-CM | POA: Diagnosis not present

## 2018-07-08 DIAGNOSIS — S0003XA Contusion of scalp, initial encounter: Secondary | ICD-10-CM | POA: Diagnosis not present

## 2018-07-21 DIAGNOSIS — K118 Other diseases of salivary glands: Secondary | ICD-10-CM | POA: Diagnosis not present

## 2018-07-24 DIAGNOSIS — Z7901 Long term (current) use of anticoagulants: Secondary | ICD-10-CM | POA: Diagnosis not present

## 2018-08-05 ENCOUNTER — Other Ambulatory Visit: Payer: Self-pay | Admitting: Cardiology

## 2018-08-07 DIAGNOSIS — Z7901 Long term (current) use of anticoagulants: Secondary | ICD-10-CM | POA: Diagnosis not present

## 2018-08-27 DIAGNOSIS — Z961 Presence of intraocular lens: Secondary | ICD-10-CM | POA: Diagnosis not present

## 2018-09-04 DIAGNOSIS — Z7901 Long term (current) use of anticoagulants: Secondary | ICD-10-CM | POA: Diagnosis not present

## 2018-09-13 ENCOUNTER — Other Ambulatory Visit: Payer: Self-pay | Admitting: Cardiology

## 2018-09-15 ENCOUNTER — Other Ambulatory Visit: Payer: Self-pay | Admitting: Cardiology

## 2018-09-24 DIAGNOSIS — J61 Pneumoconiosis due to asbestos and other mineral fibers: Secondary | ICD-10-CM | POA: Diagnosis not present

## 2018-09-24 DIAGNOSIS — Z0000 Encounter for general adult medical examination without abnormal findings: Secondary | ICD-10-CM | POA: Diagnosis not present

## 2018-09-24 DIAGNOSIS — Z7901 Long term (current) use of anticoagulants: Secondary | ICD-10-CM | POA: Diagnosis not present

## 2018-09-24 DIAGNOSIS — I10 Essential (primary) hypertension: Secondary | ICD-10-CM | POA: Diagnosis not present

## 2018-10-13 ENCOUNTER — Other Ambulatory Visit: Payer: Self-pay | Admitting: Cardiology

## 2018-10-16 DIAGNOSIS — Z7901 Long term (current) use of anticoagulants: Secondary | ICD-10-CM | POA: Diagnosis not present

## 2018-10-30 IMAGING — CR DG CHEST 2V
2 series · 2 of 2 positions shown · non-contrast
Comparison: Chest x-ray of 08/04/2015 and CT chest of 08/08/2015

CLINICAL DATA: History of pneumoconiosis, asbestosis, follow

EXAM:
CHEST  2 VIEW

[w chest pa]
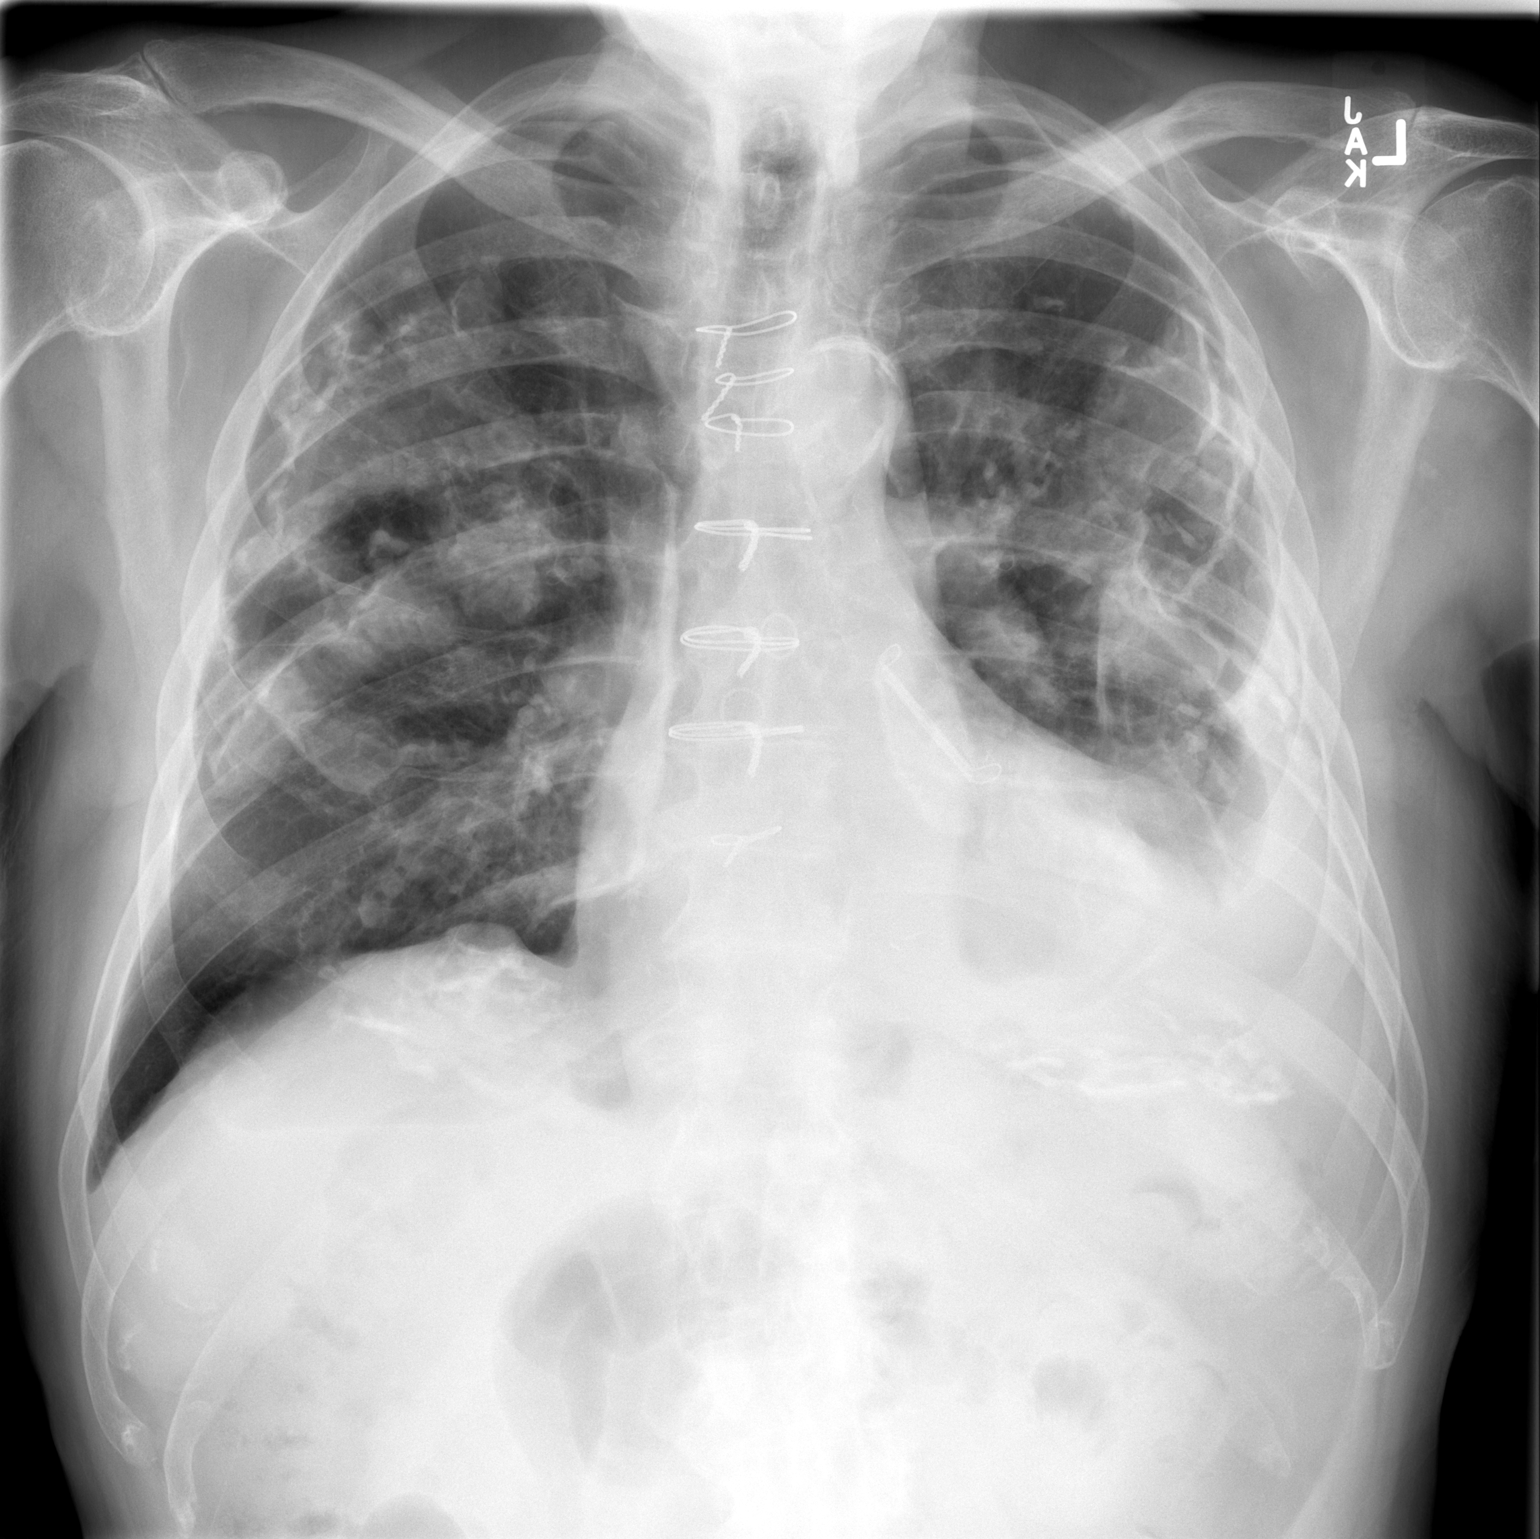

[w chest lat]
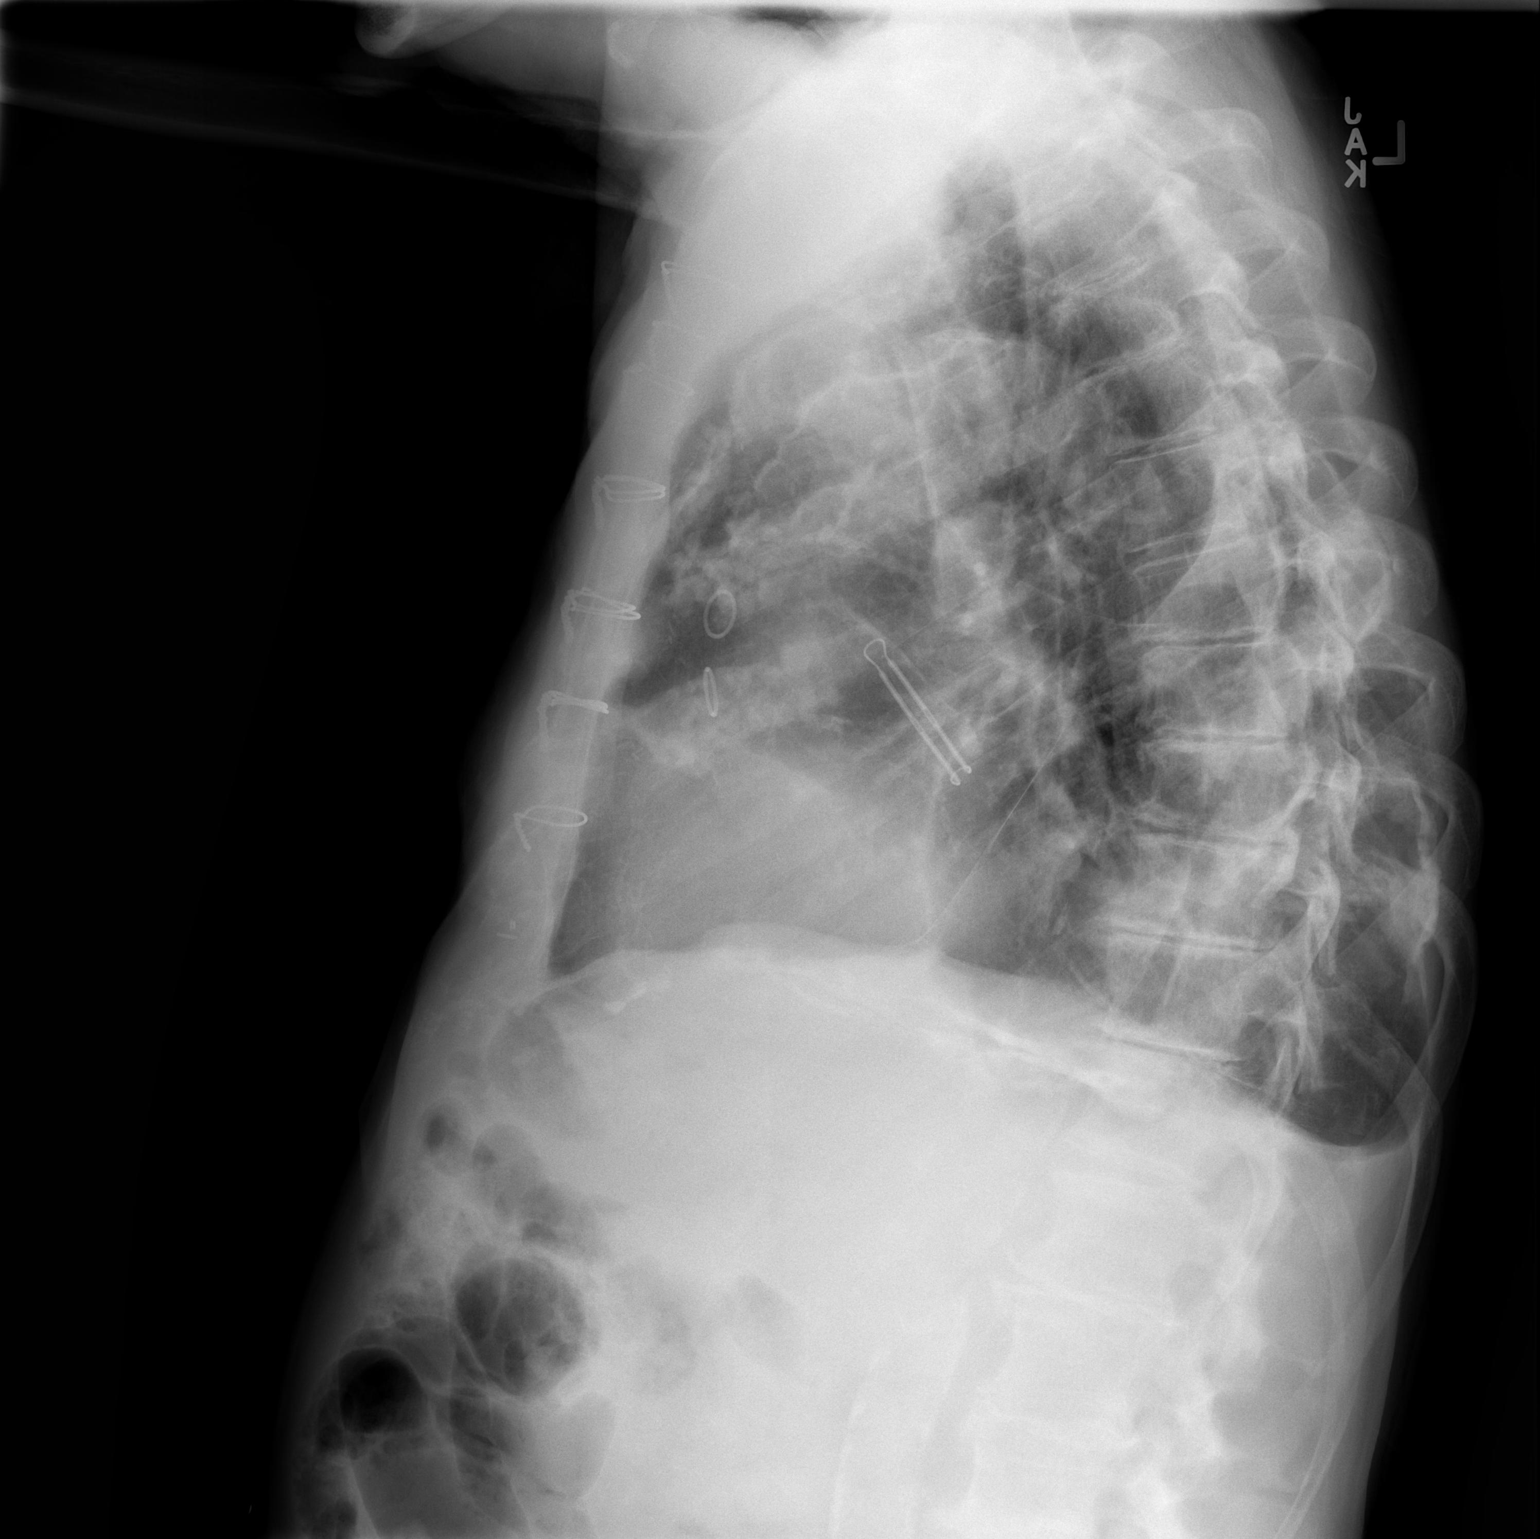

[2 of 2 positions shown; findings below may reference images not displayed]

FINDINGS: There is little change in diffuse calcified pleural plaque
formation. Both hemidiaphragms are calcified as noted previously,
consistent with asbestos related disease. Also, there is blunting of
the left costophrenic angle consistent with pleural thickening,
effusion, and atelectasis. No interval change is seen. Mediastinal
and hilar contours are unremarkable. Mild cardiomegaly is stable.
Median sternotomy sutures are present and a left atrial appendage
exclusion device remains. No acute bony abnormality is seen.
IMPRESSION: 1. Stable chronic changes with calcified pleural plaques, calcified
hemidiaphragms, and pleural thickening.
2. No definite active process.

## 2018-11-01 ENCOUNTER — Other Ambulatory Visit: Payer: Self-pay | Admitting: Cardiology

## 2018-11-03 ENCOUNTER — Other Ambulatory Visit: Payer: Self-pay | Admitting: Cardiology

## 2018-11-13 DIAGNOSIS — Z7901 Long term (current) use of anticoagulants: Secondary | ICD-10-CM | POA: Diagnosis not present

## 2018-11-30 ENCOUNTER — Other Ambulatory Visit: Payer: Self-pay | Admitting: Cardiology

## 2018-12-02 ENCOUNTER — Encounter: Payer: Self-pay | Admitting: Cardiology

## 2018-12-07 ENCOUNTER — Other Ambulatory Visit: Payer: Self-pay | Admitting: Cardiology

## 2018-12-11 DIAGNOSIS — Z7901 Long term (current) use of anticoagulants: Secondary | ICD-10-CM | POA: Diagnosis not present

## 2018-12-12 ENCOUNTER — Ambulatory Visit: Payer: Medicare Other | Admitting: Cardiology

## 2018-12-15 NOTE — Progress Notes (Signed)
HPI: FU coronary artery disease and atrial fibrillation. Cardiac catheterization in January of 2015 revealed severe coronary artery disease including an 80-90% distal left main. Ejection fraction was 35%. Preoperative carotid Dopplers in January of 2015 showed 1-39% bilateral stenosis. The patient underwent coronary artery bypassing graft with a LIMA to the LAD, saphenous vein graft to the obtuse marginal and circumflex and saphenous vein graft to the right coronary artery. He also had placement of atrial clip. He elected rate control and anticoagulation for afib. Holter monitor in February of 2015 revealed atrial fibrillation with controlled ventricular response. Echocardiogram repeated in July 2015 and showed ejection fraction 50-55%, moderate left atrial enlargement and trace aortic insufficiency.  Patient fell September 2019.  Since he was last seen 1/18, the patient denies any dyspnea on exertion, orthopnea, PND, pedal edema, palpitations, syncope or chest pain.  Current Outpatient Medications  Medication Sig Dispense Refill  . diltiazem (CARDIZEM CD) 240 MG 24 hr capsule Take 240 mg by mouth daily.    . furosemide (LASIX) 40 MG tablet Take 1 tablet (40 mg total) by mouth 2 (two) times daily. NEED OV. 180 tablet 0  . losartan (COZAAR) 25 MG tablet TAKE 1 TABLET BY MOUTH EVERY DAY 90 tablet 0  . metoprolol succinate (TOPROL-XL) 50 MG 24 hr tablet Take 1 tablet (50 mg total) by mouth daily. KEEP OV. 30 tablet 0  . Multiple Vitamin (MULTIVITAMIN) tablet Take 1 tablet by mouth daily.     . potassium chloride SA (K-DUR,KLOR-CON) 20 MEQ tablet TAKE 1 TABLET(20 MEQ) BY MOUTH DAILY. CONTACT OUT OFFICE FOR AN APPOINTMENT 30 tablet 1  . warfarin (COUMADIN) 2 MG tablet Take 3 mg by mouth daily. OR AS DIRECTED BY COUMADIN CLINIC 4 mg 2 days then 3 mg 1 day alternating     No current facility-administered medications for this visit.      Past Medical History:  Diagnosis Date  . Atrial  fibrillation ([place])    with rapid ventricular repsonse  . Bone lesion    lytic  . Debility   . HTN (hypertension)   . S/P CABG x 4 with atrial clip 10/21/2013  . Small bowel obstruction [facility])     Past Surgical History:  Procedure Laterality Date  . APPENDECTOMY    . CLIPPING OF ATRIAL APPENDAGE  10/21/2013   Procedure: CLIPPING OF ATRIAL APPENDAGE;  Surgeon: Grace Isaac, MD;  Location: [place];  Service: Open Heart Surgery;;  . CORONARY ARTERY BYPASS GRAFT N/A 10/21/2013   Procedure: CORONARY ARTERY BYPASS GRAFTING (CABG);  Surgeon: Grace Isaac, MD;  Location: [place];  Service: Open Heart Surgery;  Laterality: N/A;  . HERNIA REPAIR     x2  . INTRAOPERATIVE TRANSESOPHAGEAL ECHOCARDIOGRAM N/A 10/21/2013   Procedure: INTRAOPERATIVE TRANSESOPHAGEAL ECHOCARDIOGRAM;  Surgeon: Grace Isaac, MD;  Location: [place];  Service: Open Heart Surgery;  Laterality: N/A;  . LEFT HEART CATHETERIZATION WITH CORONARY ANGIOGRAM N/A 10/20/2013   Procedure: LEFT HEART CATHETERIZATION WITH CORONARY ANGIOGRAM;  Surgeon: Troy Sine, MD;  Location: [facility] CATH LAB;  Service: Cardiovascular;  Laterality: N/A;  . PARATHYROIDECTOMY      Social History   Socioeconomic History  . Marital status: Married    Spouse name: Not on file  . Number of children: Not on file  . Years of education: Not on file  . Highest education level: Not on file  Occupational History  . Occupation: retired    Fish farm manager: RETIRED  Social Needs  .  Financial resource strain: Not on file  . Food insecurity:    Worry: Not on file    Inability: Not on file  . Transportation needs:    Medical: Not on file    Non-medical: Not on file  Tobacco Use  . Smoking status: Former Research scientist (life sciences)  . Smokeless tobacco: Never Used  . Tobacco comment: During WWII  Substance and Sexual Activity  . Alcohol use: Yes    Alcohol/week: 7.0 standard drinks    Types: 7 Glasses of wine per week  . Drug use: No  . Sexual activity: Not on file  Lifestyle    . Physical activity:    Days per week: Not on file    Minutes per session: Not on file  . Stress: Not on file  Relationships  . Social connections:    Talks on phone: Not on file    Gets together: Not on file    Attends religious service: Not on file    Active member of club or organization: Not on file    Attends meetings of clubs or organizations: Not on file    Relationship status: Not on file  . Intimate partner violence:    Fear of current or ex partner: Not on file    Emotionally abused: Not on file    Physically abused: Not on file    Forced sexual activity: Not on file  Other Topics Concern  . Not on file  Social History Narrative   Retired from the railroad.  One child    History reviewed. No pertinent family history.  ROS: no fevers or chills, productive cough, hemoptysis, dysphasia, odynophagia, melena, hematochezia, dysuria, hematuria, rash, seizure activity, orthopnea, PND, pedal edema, claudication. Remaining systems are negative.  Physical Exam: Well-developed well-nourished in no acute distress.  Skin is warm and dry.  HEENT is normal.  Neck is supple.  Chest is clear to auscultation with normal expansion.  Cardiovascular exam is irregular Abdominal exam nontender or distended. No masses palpated. Extremities show no edema. neuro grossly intact   A/P  1 Permanent atrial fibrillation-patient doing well from a symptomatic standpoint.  Continue Cardizem and metoprolol for rate control.  Continue Coumadin.  INR is monitored by primary care. Does not want DOACs.  2 coronary artery disease-patient is not on aspirin given need for Coumadin.  He does not want to take statins.  3 hypertension-patient's blood pressure is controlled.  Continue present medications and follow.  4 ischemic cardiomyopathy-LV function has improved on most recent echocardiogram.  Continue ARB and beta-blocker.  Kirk Ruths, MD

## 2018-12-19 ENCOUNTER — Encounter: Payer: Self-pay | Admitting: Cardiology

## 2018-12-19 ENCOUNTER — Ambulatory Visit: Payer: Medicare Other | Admitting: Cardiology

## 2018-12-19 VITALS — BP 98/70 | HR 66 | Ht 66.0 in | Wt 144.0 lb

## 2018-12-19 DIAGNOSIS — I10 Essential (primary) hypertension: Secondary | ICD-10-CM

## 2018-12-19 DIAGNOSIS — I2510 Atherosclerotic heart disease of native coronary artery without angina pectoris: Secondary | ICD-10-CM

## 2018-12-19 DIAGNOSIS — I4821 Permanent atrial fibrillation: Secondary | ICD-10-CM | POA: Diagnosis not present

## 2018-12-19 DIAGNOSIS — E7800 Pure hypercholesterolemia, unspecified: Secondary | ICD-10-CM

## 2018-12-19 NOTE — Patient Instructions (Signed)

## 2018-12-29 ENCOUNTER — Other Ambulatory Visit: Payer: Self-pay | Admitting: Cardiology

## 2019-01-08 DIAGNOSIS — Z7901 Long term (current) use of anticoagulants: Secondary | ICD-10-CM | POA: Diagnosis not present

## 2019-01-09 ENCOUNTER — Other Ambulatory Visit: Payer: Self-pay

## 2019-01-09 ENCOUNTER — Emergency Department (HOSPITAL_COMMUNITY): Payer: Medicare Other

## 2019-01-09 ENCOUNTER — Observation Stay (HOSPITAL_COMMUNITY)
Admission: EM | Admit: 2019-01-09 | Discharge: 2019-01-10 | Disposition: A | Discharge status: Home / Self Care | Payer: Medicare Other | Attending: Internal Medicine | Admitting: Internal Medicine

## 2019-01-09 ENCOUNTER — Encounter (HOSPITAL_COMMUNITY): Payer: Self-pay | Admitting: *Deleted

## 2019-01-09 DIAGNOSIS — I482 Chronic atrial fibrillation, unspecified: Secondary | ICD-10-CM | POA: Diagnosis not present

## 2019-01-09 DIAGNOSIS — R948 Abnormal results of function studies of other organs and systems: Secondary | ICD-10-CM | POA: Diagnosis not present

## 2019-01-09 DIAGNOSIS — R945 Abnormal results of liver function studies: Secondary | ICD-10-CM | POA: Diagnosis not present

## 2019-01-09 DIAGNOSIS — R5381 Other malaise: Secondary | ICD-10-CM | POA: Diagnosis not present

## 2019-01-09 DIAGNOSIS — R748 Abnormal levels of other serum enzymes: Secondary | ICD-10-CM | POA: Diagnosis not present

## 2019-01-09 DIAGNOSIS — Z79899 Other long term (current) drug therapy: Secondary | ICD-10-CM | POA: Insufficient documentation

## 2019-01-09 DIAGNOSIS — N400 Enlarged prostate without lower urinary tract symptoms: Secondary | ICD-10-CM | POA: Insufficient documentation

## 2019-01-09 DIAGNOSIS — I4892 Unspecified atrial flutter: Secondary | ICD-10-CM | POA: Insufficient documentation

## 2019-01-09 DIAGNOSIS — Z87891 Personal history of nicotine dependence: Secondary | ICD-10-CM | POA: Diagnosis not present

## 2019-01-09 DIAGNOSIS — Z7901 Long term (current) use of anticoagulants: Secondary | ICD-10-CM | POA: Insufficient documentation

## 2019-01-09 DIAGNOSIS — R918 Other nonspecific abnormal finding of lung field: Secondary | ICD-10-CM | POA: Insufficient documentation

## 2019-01-09 DIAGNOSIS — K449 Diaphragmatic hernia without obstruction or gangrene: Secondary | ICD-10-CM | POA: Diagnosis not present

## 2019-01-09 DIAGNOSIS — R079 Chest pain, unspecified: Principal | ICD-10-CM

## 2019-01-09 DIAGNOSIS — I4510 Unspecified right bundle-branch block: Secondary | ICD-10-CM | POA: Diagnosis not present

## 2019-01-09 DIAGNOSIS — I4891 Unspecified atrial fibrillation: Secondary | ICD-10-CM | POA: Diagnosis not present

## 2019-01-09 DIAGNOSIS — I10 Essential (primary) hypertension: Secondary | ICD-10-CM | POA: Diagnosis present

## 2019-01-09 DIAGNOSIS — Z951 Presence of aortocoronary bypass graft: Secondary | ICD-10-CM | POA: Diagnosis not present

## 2019-01-09 DIAGNOSIS — R1084 Generalized abdominal pain: Secondary | ICD-10-CM | POA: Diagnosis not present

## 2019-01-09 DIAGNOSIS — R9389 Abnormal findings on diagnostic imaging of other specified body structures: Secondary | ICD-10-CM | POA: Diagnosis present

## 2019-01-09 DIAGNOSIS — I2510 Atherosclerotic heart disease of native coronary artery without angina pectoris: Secondary | ICD-10-CM | POA: Diagnosis not present

## 2019-01-09 DIAGNOSIS — R7989 Other specified abnormal findings of blood chemistry: Secondary | ICD-10-CM | POA: Diagnosis not present

## 2019-01-09 DIAGNOSIS — R109 Unspecified abdominal pain: Secondary | ICD-10-CM | POA: Diagnosis not present

## 2019-01-09 LAB — CBC
HCT: 38.4 % — ABNORMAL LOW (ref 39.0–52.0)
HEMOGLOBIN: 12.9 g/dL — AB (ref 13.0–17.0)
MCH: 31.9 pg (ref 26.0–34.0)
MCHC: 33.6 g/dL (ref 30.0–36.0)
MCV: 95 fL (ref 80.0–100.0)
PLATELETS: 187 10*3/uL (ref 150–400)
RBC: 4.04 MIL/uL — ABNORMAL LOW (ref 4.22–5.81)
RDW: 14 % (ref 11.5–15.5)
WBC: 7.8 10*3/uL (ref 4.0–10.5)
nRBC: 0 % (ref 0.0–0.2)

## 2019-01-09 LAB — COMPREHENSIVE METABOLIC PANEL
ALK PHOS: 170 U/L — AB (ref 38–126)
ALT: 213 U/L — AB (ref 0–44)
AST: 406 U/L — ABNORMAL HIGH (ref 15–41)
Albumin: 3.7 g/dL (ref 3.5–5.0)
Anion gap: 13 (ref 5–15)
BILIRUBIN TOTAL: 2.4 mg/dL — AB (ref 0.3–1.2)
BUN: 16 mg/dL (ref 8–23)
CALCIUM: 9.4 mg/dL (ref 8.9–10.3)
CO2: 24 mmol/L (ref 22–32)
CREATININE: 1.06 mg/dL (ref 0.61–1.24)
Chloride: 98 mmol/L (ref 98–111)
GFR, EST NON AFRICAN AMERICAN: 58 mL/min — AB (ref >60)
Glucose, Bld: 173 mg/dL — ABNORMAL HIGH (ref 70–99)
Potassium: 3.9 mmol/L (ref 3.5–5.1)
Sodium: 135 mmol/L (ref 135–145)
TOTAL PROTEIN: 6.8 g/dL (ref 6.5–8.1)

## 2019-01-09 LAB — I-STAT TROPONIN, ED: TROPONIN I, POC: 0.01 ng/mL (ref 0.00–0.08)

## 2019-01-09 LAB — LIPASE, BLOOD: LIPASE: 74 U/L — AB (ref 11–51)

## 2019-01-09 MED ORDER — IOHEXOL 300 MG/ML  SOLN
100.0000 mL | Freq: Once | INTRAMUSCULAR | Status: AC | PRN
  Administered 2019-01-09: 100 mL via INTRAVENOUS

## 2019-01-09 MED ORDER — SODIUM CHLORIDE 0.9% FLUSH: 3.0000 mL | Freq: Once | INTRAVENOUS | Status: DC

## 2019-01-09 NOTE — ED Notes (Signed)
The pt wants to leave he is confused

## 2019-01-09 NOTE — ED Provider Notes (Signed)
[facility] EMERGENCY DEPARTMENT Provider Note   CSN: [id number] Arrival date & time: 01/09/19  2032    History   Chief Complaint Chief Complaint  Patient presents with   Abdominal Pain    HPI [name] is a 85 y.o. male.     Nursing notes report abdominal pain since earlier today.  Patient reports left-sided chest pain.  No dyspnea, diaphoresis, nausea, vomiting, diarrhea, jaundicing, fever, sweats, chills.  No previous history of liver or gallbladder problems.  Patient lives independently at a nursing facility.  Severity of symptoms is mild to moderate.  Nothing makes symptoms better or worse     Past Medical History:  Diagnosis Date   Atrial fibrillation (HCC)    with rapid ventricular repsonse   Bone lesion    lytic   Debility    HTN (hypertension)    S/P CABG x 4 with atrial clip 10/21/2013   Small bowel obstruction [facility])     Patient Active Problem List   Diagnosis Date Noted   Swelling in head/neck    CAD (coronary artery disease) 11/30/2013   PAF history 10/27/2013   Chronic anticoagulation 10/27/2013   Cardiomyopathy, ischemic- EF 35-40% echo 10/19/12 10/27/2013   S/P CABG x 4 with atrial clip 10/21/2013   Left main coronary artery disease 10/20/2013   Hx of asbestosis 10/20/2013   NSTEMI (non-ST elevated myocardial infarction) ([place]) 10/17/2013   Essential hypertension, benign 08/21/2010   Atrial fibrillation with rapid ventricular response on adm 10/17/13 08/21/2010    Past Surgical History:  Procedure Laterality Date   APPENDECTOMY     CLIPPING OF ATRIAL APPENDAGE  10/21/2013   Procedure: CLIPPING OF ATRIAL APPENDAGE;  Surgeon: Grace Isaac, MD;  Location: [place];  Service: Open Heart Surgery;;   CORONARY ARTERY BYPASS GRAFT N/A 10/21/2013   Procedure: CORONARY ARTERY BYPASS GRAFTING (CABG);  Surgeon: Grace Isaac, MD;  Location: [place];  Service: Open Heart Surgery;  Laterality: N/A;   HERNIA REPAIR     x2   INTRAOPERATIVE TRANSESOPHAGEAL ECHOCARDIOGRAM N/A 10/21/2013   Procedure: INTRAOPERATIVE TRANSESOPHAGEAL ECHOCARDIOGRAM;  Surgeon: Grace Isaac, MD;  Location: [place];  Service: Open Heart Surgery;  Laterality: N/A;   LEFT HEART CATHETERIZATION WITH CORONARY ANGIOGRAM N/A 10/20/2013   Procedure: LEFT HEART CATHETERIZATION WITH CORONARY ANGIOGRAM;  Surgeon: Troy Sine, MD;  Location: [facility] CATH LAB;  Service: Cardiovascular;  Laterality: N/A;   PARATHYROIDECTOMY          Home Medications    Prior to Admission medications   Medication Sig Start Date End Date Taking? Authorizing Provider  diltiazem (CARDIZEM CD) 240 MG 24 hr capsule Take 240 mg by mouth daily.    [provider]  furosemide (LASIX) 40 MG tablet Take 1 tablet (40 mg total) by mouth 2 (two) times daily. NEED OV. 05/05/18   Lelon Perla, MD  losartan (COZAAR) 25 MG tablet TAKE 1 TABLET BY MOUTH EVERY DAY 06/18/17   Lelon Perla, MD  metoprolol succinate (TOPROL-XL) 50 MG 24 hr tablet TAKE 1 TABLET BY MOUTH DAILY 12/30/18   Lelon Perla, MD  Multiple Vitamin (MULTIVITAMIN) tablet Take 1 tablet by mouth daily.     [provider]  potassium chloride SA (K-DUR,KLOR-CON) 20 MEQ tablet TAKE 1 TABLET(20 MEQ) BY MOUTH DAILY. CONTACT OUT OFFICE FOR AN APPOINTMENT 12/09/18   Lelon Perla, MD  warfarin (COUMADIN) 2 MG tablet Take 3 mg by mouth daily. OR AS DIRECTED BY  COUMADIN CLINIC 4 mg 2 days then 3 mg 1 day alternating    [provider]    Family History No family history on file.  Social History Social History   Tobacco Use   Smoking status: Former Smoker   Smokeless tobacco: Never Used   Tobacco comment: During WWII  Substance Use Topics   Alcohol use: Yes    Alcohol/week: 7.0 standard drinks    Types: 7 Glasses of wine per week   Drug use: No     Allergies   Norvasc [amlodipine besylate]; Symmetrel [amantadine]; and Hydrochlorothiazide   Review of  Systems Review of Systems  All other systems reviewed and are negative.    Physical Exam Updated Vital Signs BP 135/60 (BP Location: Left Arm)    Pulse 79    Temp 98.5 F (36.9 C) (Oral)    Resp 20    Ht 5\' 8"  (1.727 m)    Wt 65.8 kg    SpO2 93%    BMI 22.05 kg/m   Physical Exam Vitals signs and nursing note reviewed.  Constitutional:      Appearance: He is well-developed.     Comments: ? jaundiced  HENT:     Head: Normocephalic and atraumatic.  Eyes:     Conjunctiva/sclera: Conjunctivae normal.  Neck:     Musculoskeletal: Neck supple.  Cardiovascular:     Rate and Rhythm: Normal rate and regular rhythm.  Pulmonary:     Effort: Pulmonary effort is normal.     Breath sounds: Normal breath sounds.  Abdominal:     General: Bowel sounds are normal.     Palpations: Abdomen is soft.     Comments: Nontender in epigastrium  Musculoskeletal: Normal range of motion.  Skin:    General: Skin is warm and dry.  Neurological:     Mental Status: He is alert and oriented to person, place, and time.  Psychiatric:        Behavior: Behavior normal.      ED Treatments / Results  Labs (all labs ordered are listed, but only abnormal results are displayed) Labs Reviewed  LIPASE, BLOOD - Abnormal; Notable for the following components:      Result Value   Lipase 74 (*)    All other components within normal limits  COMPREHENSIVE METABOLIC PANEL - Abnormal; Notable for the following components:   Glucose, Bld 173 (*)    AST 406 (*)    ALT 213 (*)    Alkaline Phosphatase 170 (*)    Total Bilirubin 2.4 (*)    GFR calc non Af Amer 58 (*)    All other components within normal limits  CBC - Abnormal; Notable for the following components:   RBC 4.04 (*)    Hemoglobin 12.9 (*)    HCT 38.4 (*)    All other components within normal limits  URINALYSIS, ROUTINE W REFLEX MICROSCOPIC  I-STAT TROPONIN, ED    EKG EKG Interpretation  Date/Time:  Friday January 09 2019 20:33:12  EDT Ventricular Rate:  79 PR Interval:    QRS Duration: 113 QT Interval:  353 QTC Calculation: 405 R Axis:   -39 Text Interpretation:  Atrial fibrillation Incomplete RBBB and LAFB LVH with secondary repolarization abnormality Confirmed by Nat Christen [phone]) on 01/09/2019 11:53:04 PM   Radiology Ct Abdomen Pelvis W Contrast  Result Date: 01/09/2019 CLINICAL DATA:  Abdominal pain since earlier today. Elevated liver function tests. EXAM: CT ABDOMEN AND PELVIS WITH CONTRAST TECHNIQUE: Multidetector CT imaging of  the abdomen and pelvis was performed using the standard protocol following bolus administration of intravenous contrast. CONTRAST:  149mL OMNIPAQUE IOHEXOL 300 MG/ML  SOLN COMPARISON:  Head CT 08/18/2015 FINDINGS: Lower chest: Cardiomegaly with coronary arteriosclerosis and aortic atherosclerosis. The patient is status post CABG. Calcified pleural plaque along the periphery of both lungs are identified with new rounded masslike opacities at each lung base compatible with rounded atelectasis given what appears to be prior asbestos exposure. The largest opacity at the right lung base measures at least 5.8 x 4.4 x 3.8 cm. Slightly more oblong opacity at the left lung base is noted measuring at least 5.4 x 3.5 x 4.1 cm. A lingular opacity which is partially included is again noted and based on prior imaging is contiguous with the pleural thickening. Hepatobiliary: 3 mm hypodensities in the right hepatic lobe too small to further characterize are identified statistically consistent with cysts. The gallbladder is physiologically distended. There may be some minimal internal debris without definite calculus or secondary signs of acute cholecystitis. Pancreas: No inflammation, ductal dilatation or mass. Spleen: Normal Adrenals/Urinary Tract: Normal bilateral adrenal glands. Subcentimeter hypodensities within both kidneys statistically consistent with cysts. No nephrolithiasis nor obstructive uropathy. No  hydroureteronephrosis. The urinary bladder is unremarkable for the degree of distention. Stomach/Bowel: Small hiatal hernia. Physiologic distention of the stomach. The duodenal sweep and ligament of Treitz are normal. No small bowel obstruction or inflammation. Moderate stool retention within the colon. The appendix is normal. Vascular/Lymphatic: Marked aortoiliac and branch vessel atherosclerosis without aneurysm, dissection or significant stenosis. No lymphadenopathy. Reproductive: Enlarged prostate measuring up to 5 cm transverse impressing upon the base of the bladder. Other: No abdominal wall hernia or abnormality. No abdominopelvic ascites. Musculoskeletal: Thoracolumbar spondylosis with marked disc space narrowing T8 through S1. IMPRESSION: 1. Calcified pleural plaque are redemonstrated. Rounded masslike opacities at each lung base are most consistent with rounded atelectasis which can be seen in the setting of prior asbestos exposure. Follow-up dedicated chest CT may help assess for disease elsewhere within the chest and to assure stability. 2. Tiny too small to characterize hypodensities within the liver and both kidneys statistically consistent with cysts. 3. Prostatomegaly. 4. Thoracolumbar spondylosis. 5. No acute bowel obstruction or inflammation. 6. Probable small volume of biliary sludge within the gallbladder without secondary signs of acute cholecystitis. Electronically Signed   By: Ashley Royalty M.D.   On: 01/09/2019 23:34   Dg Chest Port 1 View  Result Date: 01/09/2019 CLINICAL DATA:  85 year old with chest pain. EXAM: PORTABLE CHEST 1 VIEW COMPARISON:  Frontal and lateral views 09/18/2016 FINDINGS: Post median sternotomy and left atrial clipping. Mild cardiomegaly is similar to prior exam. Unchanged mediastinal contours with aortic atherosclerosis. Again seen bilateral calcified pleural plaques. Chronic left pleural effusion has decreased from prior exam. No evidence of acute airspace disease.  No pulmonary edema. IMPRESSION: 1. No acute findings. 2. Chronic left pleural effusion, decreased from prior exam. Mild cardiomegaly also unchanged. 3. Bilateral calcified pleural plaques. Electronically Signed   By: Keith Rake M.D.   On: 01/09/2019 21:48    Procedures Procedures (including critical care time)  Medications Ordered in ED Medications  sodium chloride flush (NS) 0.9 % injection 3 mL (has no administration in time range)  iohexol (OMNIPAQUE) 300 MG/ML solution 100 mL (100 mLs Intravenous Contrast Given 01/09/19 2237)     Initial Impression / Assessment and Plan / ED Course  I have reviewed the triage vital signs and the nursing notes.  Pertinent labs &  imaging results that were available during my care of the patient were reviewed by me and considered in my medical decision making (see chart for details).        Patient presents with chest and abdominal pain.  EKG shows no acute changes.  Troponin negative.  Liver functions pan elevated. CT reveals no acute biliary pathology.  Discussed with hospitalist.  Will get right upper quadrant ultrasound.  Admit. Final Clinical Impressions(s) / ED Diagnoses   Final diagnoses:  Chest pain, unspecified type  Elevated lipase  Elevated liver enzymes    ED Discharge Orders    None       Nat Christen, MD 01/09/19 2354

## 2019-01-09 NOTE — ED Triage Notes (Signed)
The pt is c/o ab pain since earlier today  He was seen by his doctor at 1200n tODAY  Then his abd started hurting

## 2019-01-09 NOTE — Discharge Instructions (Signed)
Your lipase which is a chemical associated with your pancreas gland was slightly elevated at

## 2019-01-10 ENCOUNTER — Other Ambulatory Visit: Payer: Self-pay

## 2019-01-10 DIAGNOSIS — R7989 Other specified abnormal findings of blood chemistry: Secondary | ICD-10-CM | POA: Diagnosis not present

## 2019-01-10 DIAGNOSIS — I2510 Atherosclerotic heart disease of native coronary artery without angina pectoris: Secondary | ICD-10-CM

## 2019-01-10 DIAGNOSIS — I2583 Coronary atherosclerosis due to lipid rich plaque: Secondary | ICD-10-CM

## 2019-01-10 DIAGNOSIS — I10 Essential (primary) hypertension: Secondary | ICD-10-CM | POA: Diagnosis not present

## 2019-01-10 DIAGNOSIS — R945 Abnormal results of liver function studies: Secondary | ICD-10-CM

## 2019-01-10 DIAGNOSIS — R9389 Abnormal findings on diagnostic imaging of other specified body structures: Secondary | ICD-10-CM | POA: Diagnosis present

## 2019-01-10 DIAGNOSIS — R748 Abnormal levels of other serum enzymes: Secondary | ICD-10-CM

## 2019-01-10 DIAGNOSIS — R079 Chest pain, unspecified: Secondary | ICD-10-CM | POA: Diagnosis not present

## 2019-01-10 DIAGNOSIS — R109 Unspecified abdominal pain: Secondary | ICD-10-CM

## 2019-01-10 LAB — COMPREHENSIVE METABOLIC PANEL
ALT: 463 U/L — AB (ref 0–44)
AST: 700 U/L — ABNORMAL HIGH (ref 15–41)
Albumin: 3.4 g/dL — ABNORMAL LOW (ref 3.5–5.0)
Alkaline Phosphatase: 171 U/L — ABNORMAL HIGH (ref 38–126)
Anion gap: 12 (ref 5–15)
BUN: 17 mg/dL (ref 8–23)
CALCIUM: 9.3 mg/dL (ref 8.9–10.3)
CO2: 22 mmol/L (ref 22–32)
Chloride: 101 mmol/L (ref 98–111)
Creatinine, Ser: 1.03 mg/dL (ref 0.61–1.24)
GFR calc Af Amer: >60 mL/min (ref >60)
GFR calc non Af Amer: >60 mL/min (ref >60)
Glucose, Bld: 150 mg/dL — ABNORMAL HIGH (ref 70–99)
Potassium: 3.2 mmol/L — ABNORMAL LOW (ref 3.5–5.1)
Sodium: 135 mmol/L (ref 135–145)
TOTAL PROTEIN: 6.2 g/dL — AB (ref 6.5–8.1)
Total Bilirubin: 3.3 mg/dL — ABNORMAL HIGH (ref 0.3–1.2)

## 2019-01-10 LAB — PROTIME-INR
INR: 2.2 — ABNORMAL HIGH (ref 0.8–1.2)
Prothrombin Time: 23.7 seconds — ABNORMAL HIGH (ref 11.4–15.2)

## 2019-01-10 LAB — TROPONIN I
Troponin I: 0.04 ng/mL (ref <0.03)
Troponin I: 0.05 ng/mL (ref <0.03)
Troponin I: 0.06 ng/mL (ref <0.03)

## 2019-01-10 LAB — MRSA PCR SCREENING: MRSA by PCR: NEGATIVE

## 2019-01-10 MED ORDER — ACETAMINOPHEN 650 MG RE SUPP: 650.0000 mg | Freq: Four times a day (QID) | RECTAL | Status: DC | PRN

## 2019-01-10 MED ORDER — FUROSEMIDE 40 MG PO TABS
40.0000 mg | ORAL_TABLET | Freq: Two times a day (BID) | ORAL | Status: DC
  Administered 2019-01-10: 40 mg via ORAL
  Filled 2019-01-10: qty 1

## 2019-01-10 MED ORDER — ONDANSETRON HCL 4 MG/2ML IJ SOLN: 4.0000 mg | Freq: Four times a day (QID) | INTRAMUSCULAR | Status: DC | PRN

## 2019-01-10 MED ORDER — SODIUM CHLORIDE 0.9% FLUSH
3.0000 mL | Freq: Two times a day (BID) | INTRAVENOUS | Status: DC
  Administered 2019-01-10 (×2): 3 mL via INTRAVENOUS

## 2019-01-10 MED ORDER — ONDANSETRON HCL 4 MG PO TABS: 4.0000 mg | ORAL_TABLET | Freq: Four times a day (QID) | ORAL | Status: DC | PRN

## 2019-01-10 MED ORDER — LOSARTAN POTASSIUM 25 MG PO TABS
25.0000 mg | ORAL_TABLET | Freq: Every day | ORAL | Status: DC
  Administered 2019-01-10: 25 mg via ORAL
  Filled 2019-01-10: qty 1

## 2019-01-10 MED ORDER — SODIUM CHLORIDE 0.9% FLUSH: 3.0000 mL | INTRAVENOUS | Status: DC | PRN

## 2019-01-10 MED ORDER — SODIUM CHLORIDE 0.9 % IV SOLN: 250.0000 mL | INTRAVENOUS | Status: DC | PRN

## 2019-01-10 MED ORDER — DILTIAZEM HCL ER COATED BEADS 240 MG PO CP24
240.0000 mg | ORAL_CAPSULE | Freq: Every day | ORAL | Status: DC
  Administered 2019-01-10: 240 mg via ORAL
  Filled 2019-01-10: qty 1

## 2019-01-10 MED ORDER — ACETAMINOPHEN 325 MG PO TABS: 650.0000 mg | ORAL_TABLET | Freq: Four times a day (QID) | ORAL | Status: DC | PRN

## 2019-01-10 MED ORDER — METOPROLOL SUCCINATE ER 50 MG PO TB24
50.0000 mg | ORAL_TABLET | Freq: Every day | ORAL | Status: DC
  Administered 2019-01-10: 50 mg via ORAL
  Filled 2019-01-10: qty 1

## 2019-01-10 MED ORDER — ADULT MULTIVITAMIN W/MINERALS CH
1.0000 | ORAL_TABLET | Freq: Every day | ORAL | Status: DC
  Administered 2019-01-10: 1 via ORAL
  Filled 2019-01-10: qty 1

## 2019-01-10 NOTE — H&P (Signed)
[facility]                                                                                    Patient Demographics  [name], is a 85 y.o. male  CSN: [id number]  MRN: [id number]  DOB - [birth year]  Admit Date - 01/09/2019  Outpatient Primary MD for the patient is Lajean Manes, MD   With History of -  Past Medical History:  Diagnosis Date  . Atrial fibrillation ([place])    with rapid ventricular repsonse  . Bone lesion    lytic  . Debility   . HTN (hypertension)   . S/P CABG x 4 with atrial clip 10/21/2013  . Small bowel obstruction [facility])       Past Surgical History:  Procedure Laterality Date  . APPENDECTOMY    . CLIPPING OF ATRIAL APPENDAGE  10/21/2013   Procedure: CLIPPING OF ATRIAL APPENDAGE;  Surgeon: Grace Isaac, MD;  Location: [place];  Service: Open Heart Surgery;;  . CORONARY ARTERY BYPASS GRAFT N/A 10/21/2013   Procedure: CORONARY ARTERY BYPASS GRAFTING (CABG);  Surgeon: Grace Isaac, MD;  Location: [place];  Service: Open Heart Surgery;  Laterality: N/A;  . HERNIA REPAIR     x2  . INTRAOPERATIVE TRANSESOPHAGEAL ECHOCARDIOGRAM N/A 10/21/2013   Procedure: INTRAOPERATIVE TRANSESOPHAGEAL ECHOCARDIOGRAM;  Surgeon: Grace Isaac, MD;  Location: [place];  Service: Open Heart Surgery;  Laterality: N/A;  . LEFT HEART CATHETERIZATION WITH CORONARY ANGIOGRAM N/A 10/20/2013   Procedure: LEFT HEART CATHETERIZATION WITH CORONARY ANGIOGRAM;  Surgeon: Troy Sine, MD;  Location: [facility] CATH LAB;  Service: Cardiovascular;  Laterality: N/A;  . PARATHYROIDECTOMY      in for   Chief Complaint  Patient presents with  . Abdominal Pain     HPI  [name]  is a 85 y.o. male, with past medical history significant for A. fib, hypertension and CAD status post CABG x4 presenting with 1 day history of epigastric/left-sided chest pain that started at noon today.  No associated cold sweats nausea vomiting shortness of breath.  No lower extremity edema.  No history  of fever/chills or cough. Doing his evaluation in the emergency room, patient was noted to have elevated liver function tests, CT of abdomen was negative except for sludge in the gallbladder and asbestosis like plaques in his lower chest. His EKG showed A. fib/flutter with controlled rate and no acute changes    Review of Systems    In addition to the HPI above, No Fever-chills, No Headache, No changes with Vision or hearing, No problems swallowing food or Liquids, No  Cough or Shortness of Breath, No Abdominal pain, No Nausea or Vommitting, Bowel movements are regular, No Blood in stool or Urine, No dysuria, No new skin rashes or bruises, No new joints pains-aches,  No new weakness, tingling, numbness in any extremity, No recent weight gain or loss, No polyuria, polydypsia or polyphagia, No significant Mental Stressors.  A full 10 point Review of Systems was done, except as stated above, all other Review of Systems were negative.   Social History Social History   Tobacco Use  . Smoking status: Former  Smoker  . Smokeless tobacco: Never Used  . Tobacco comment: During WWII  Substance Use Topics  . Alcohol use: Yes    Alcohol/week: 7.0 standard drinks    Types: 7 Glasses of wine per week     Family History Asked and reviewed, noncontributory  Prior to Admission medications   Medication Sig Start Date End Date Taking? Authorizing Provider  diltiazem (CARDIZEM CD) 240 MG 24 hr capsule Take 240 mg by mouth daily.    [provider]  furosemide (LASIX) 40 MG tablet Take 1 tablet (40 mg total) by mouth 2 (two) times daily. NEED OV. 05/05/18   Lelon Perla, MD  losartan (COZAAR) 25 MG tablet TAKE 1 TABLET BY MOUTH EVERY DAY 06/18/17   Lelon Perla, MD  metoprolol succinate (TOPROL-XL) 50 MG 24 hr tablet TAKE 1 TABLET BY MOUTH DAILY 12/30/18   Lelon Perla, MD  Multiple Vitamin (MULTIVITAMIN) tablet Take 1 tablet by mouth daily.     [provider]   potassium chloride SA (K-DUR,KLOR-CON) 20 MEQ tablet TAKE 1 TABLET(20 MEQ) BY MOUTH DAILY. CONTACT OUT OFFICE FOR AN APPOINTMENT 12/09/18   Lelon Perla, MD  warfarin (COUMADIN) 2 MG tablet Take 3 mg by mouth daily. OR AS DIRECTED BY COUMADIN CLINIC 4 mg 2 days then 3 mg 1 day alternating    [provider]    Allergies  Allergen Reactions  . Norvasc [Amlodipine Besylate] Other (See Comments)    Weakness   . Symmetrel [Amantadine] Cough  . Hydrochlorothiazide Palpitations    Physical Exam  Vitals  Blood pressure 135/60, pulse 79, temperature 98.5 F (36.9 C), temperature source Oral, resp. rate 20, height 5\' 8"  (1.727 m), weight 65.8 kg, SpO2 93 %.   1. General elderly male, no acute distress, pleasant  2. Normal affect and insight, Not Suicidal or Homicidal, Awake Alert, Oriented X 3.  3. No F.N deficits, grossly, patient moving all extremities.  4. Ears and Eyes appear Normal, Conjunctivae clear, PERRLA. Moist Oral Mucosa.  5. Supple Neck, No JVD, No cervical lymphadenopathy appriciated, No Carotid Bruits.  6. Symmetrical Chest wall movement, Good air movement bilaterally, CTAB.  7.  Irregular irregular, No Gallops, Rubs or Murmurs, No Parasternal Heave.  8. Positive Bowel Sounds, Abdomen Soft, Non tender, No organomegaly appriciated,No rebound -guarding or rigidity.  9.  No Cyanosis, Normal Skin Turgor, No Skin Rash or Bruise.  10. Good muscle tone,  joints appear normal , no effusions, Normal ROM.    Data Review  CBC Recent Labs  Lab 01/09/19 2108  WBC 7.8  HGB 12.9*  HCT 38.4*  PLT 187  MCV 95.0  MCH 31.9  MCHC 33.6  RDW 14.0   ------------------------------------------------------------------------------------------------------------------  Chemistries  Recent Labs  Lab 01/09/19 2108  NA 135  K 3.9  CL 98  CO2 24  GLUCOSE 173*  BUN 16  CREATININE 1.06  CALCIUM 9.4  AST 406*  ALT 213*  ALKPHOS 170*  BILITOT 2.4*    ------------------------------------------------------------------------------------------------------------------ estimated creatinine clearance is 36.2 mL/min (by C-G formula based on SCr of 1.06 mg/dL). ------------------------------------------------------------------------------------------------------------------ No results for input(s): TSH, T4TOTAL, T3FREE, THYROIDAB in the last 72 hours.  Invalid input(s): FREET3   Coagulation profile No results for input(s): INR, PROTIME in the last 168 hours. ------------------------------------------------------------------------------------------------------------------- No results for input(s): DDIMER in the last 72 hours. -------------------------------------------------------------------------------------------------------------------  Cardiac Enzymes No results for input(s): CKMB, TROPONINI, MYOGLOBIN in the last 168 hours.  Invalid input(s): CK ------------------------------------------------------------------------------------------------------------------ Invalid input(s): POCBNP   ---------------------------------------------------------------------------------------------------------------  Urinalysis    Component Value Date/Time   COLORURINE YELLOW 10/17/2013 1533   APPEARANCEUR CLEAR 10/17/2013 1533   LABSPEC 1.018 10/17/2013 1533   PHURINE 6.0 10/17/2013 1533   GLUCOSEU NEGATIVE 10/17/2013 1533   HGBUR NEGATIVE 10/17/2013 1533   BILIRUBINUR NEGATIVE 10/17/2013 1533   KETONESUR 40 (A) 10/17/2013 1533   PROTEINUR NEGATIVE 10/17/2013 1533   UROBILINOGEN 1.0 10/17/2013 1533   NITRITE NEGATIVE 10/17/2013 1533   LEUKOCYTESUR NEGATIVE 10/17/2013 1533    ----------------------------------------------------------------------------------------------------------------   Imaging results:   Ct Abdomen Pelvis W Contrast  Result Date: 01/09/2019 CLINICAL DATA:  Abdominal pain since earlier today. Elevated liver function  tests. EXAM: CT ABDOMEN AND PELVIS WITH CONTRAST TECHNIQUE: Multidetector CT imaging of the abdomen and pelvis was performed using the standard protocol following bolus administration of intravenous contrast. CONTRAST:  143mL OMNIPAQUE IOHEXOL 300 MG/ML  SOLN COMPARISON:  Head CT 08/18/2015 FINDINGS: Lower chest: Cardiomegaly with coronary arteriosclerosis and aortic atherosclerosis. The patient is status post CABG. Calcified pleural plaque along the periphery of both lungs are identified with new rounded masslike opacities at each lung base compatible with rounded atelectasis given what appears to be prior asbestos exposure. The largest opacity at the right lung base measures at least 5.8 x 4.4 x 3.8 cm. Slightly more oblong opacity at the left lung base is noted measuring at least 5.4 x 3.5 x 4.1 cm. A lingular opacity which is partially included is again noted and based on prior imaging is contiguous with the pleural thickening. Hepatobiliary: 3 mm hypodensities in the right hepatic lobe too small to further characterize are identified statistically consistent with cysts. The gallbladder is physiologically distended. There may be some minimal internal debris without definite calculus or secondary signs of acute cholecystitis. Pancreas: No inflammation, ductal dilatation or mass. Spleen: Normal Adrenals/Urinary Tract: Normal bilateral adrenal glands. Subcentimeter hypodensities within both kidneys statistically consistent with cysts. No nephrolithiasis nor obstructive uropathy. No hydroureteronephrosis. The urinary bladder is unremarkable for the degree of distention. Stomach/Bowel: Small hiatal hernia. Physiologic distention of the stomach. The duodenal sweep and ligament of Treitz are normal. No small bowel obstruction or inflammation. Moderate stool retention within the colon. The appendix is normal. Vascular/Lymphatic: Marked aortoiliac and branch vessel atherosclerosis without aneurysm, dissection or  significant stenosis. No lymphadenopathy. Reproductive: Enlarged prostate measuring up to 5 cm transverse impressing upon the base of the bladder. Other: No abdominal wall hernia or abnormality. No abdominopelvic ascites. Musculoskeletal: Thoracolumbar spondylosis with marked disc space narrowing T8 through S1. IMPRESSION: 1. Calcified pleural plaque are redemonstrated. Rounded masslike opacities at each lung base are most consistent with rounded atelectasis which can be seen in the setting of prior asbestos exposure. Follow-up dedicated chest CT may help assess for disease elsewhere within the chest and to assure stability. 2. Tiny too small to characterize hypodensities within the liver and both kidneys statistically consistent with cysts. 3. Prostatomegaly. 4. Thoracolumbar spondylosis. 5. No acute bowel obstruction or inflammation. 6. Probable small volume of biliary sludge within the gallbladder without secondary signs of acute cholecystitis. Electronically Signed   By: Ashley Royalty M.D.   On: 01/09/2019 23:34   Dg Chest Port 1 View  Result Date: 01/09/2019 CLINICAL DATA:  85 year old with chest pain. EXAM: PORTABLE CHEST 1 VIEW COMPARISON:  Frontal and lateral views 09/18/2016 FINDINGS: Post median sternotomy and left atrial clipping. Mild cardiomegaly is similar to prior exam. Unchanged mediastinal contours with aortic atherosclerosis. Again seen bilateral calcified pleural plaques. Chronic left pleural effusion has decreased from  prior exam. No evidence of acute airspace disease. No pulmonary edema. IMPRESSION: 1. No acute findings. 2. Chronic left pleural effusion, decreased from prior exam. Mild cardiomegaly also unchanged. 3. Bilateral calcified pleural plaques. Electronically Signed   By: Keith Rake M.D.   On: 01/09/2019 21:48    My personal review of EKG: Atrial flutter at 79 bpm with RBBB and LAF and LVH   Assessment & Plan  Chest pain with significant history of CAD Serial  troponins EKG does not show any acute changes  Elevated liver function tests and lipase.  Patient's pain is epigastric/left chest Follow in a.m. Ultrasound of liver and gallbladder is pending CT of abdomen showed only sludge with no signs of Coley cystitis  Atrial flutter/fib-chronic Controlled rate continue with Cardizem, Lopressor Continue with Coumadin  Hypertension Controlled with Cardizem, Lopressor and losartan  Lung abnormalities with calcified pleural plaques consistent with prior asbestos exposure.  Observe  DVT Prophylaxis Coumadin  AM Labs Ordered, also please review Full Orders    Code Status full  Disposition Plan: To assisted living facility  Time spent in minutes : 39 minutes  Condition GUARDED   @SIGNATURE @

## 2019-01-10 NOTE — Evaluation (Signed)
Physical Therapy Evaluation Patient Details Name: EDRIS FRIEDT MRN: [id number] DOB: 1918-06-07 Today's Date: 01/10/2019   History of Present Illness  Patient presented to hospital with chest pain. He deines chest pain at this time. PMH HTN, CABG, bone leasion, small bowel obsturction; debility; HTN  Clinical Impression  Patient appears to be at his baseline mobility. He required no assist with abny mobility tasks. He has no need for further skilled therapy at this time.     Follow Up Recommendations No PT follow up    Equipment Recommendations  None recommended by PT    Recommendations for Other Services       Precautions / Restrictions Precautions Precautions: None Restrictions Weight Bearing Restrictions: No      Mobility  Bed Mobility Overal bed mobility: Independent             General bed mobility comments: Patient sat up without assistance   Transfers Overall transfer level: Independent               General transfer comment: Patient had no loss of balance   Ambulation/Gait Ambulation/Gait assistance: Independent Gait Distance (Feet): 50 Feet Assistive device: None       General Gait Details: Patient required no assistance. No loss of balance and no fatigue reported. Patient feels like he is at his baseline  Financial trader Rankin (Stroke Patients Only)       Balance Overall balance assessment: Independent                                           Pertinent Vitals/Pain Pain Assessment: No/denies pain    Home Living Family/patient expects to be discharged to:: Other (Comment)                 Additional Comments: Indepdnent living at [facility]     Prior Function Level of Independence: Independent         Comments: Reports no balance issues      Hand Dominance   Dominant Hand: Right    Extremity/Trunk Assessment   Upper Extremity Assessment Upper  Extremity Assessment: Overall WFL for tasks assessed    Lower Extremity Assessment Lower Extremity Assessment: Overall WFL for tasks assessed    Cervical / Trunk Assessment Cervical / Trunk Assessment: Normal  Communication   Communication: No difficulties  Cognition Arousal/Alertness: Awake/alert Behavior During Therapy: WFL for tasks assessed/performed Overall Cognitive Status: Within Functional Limits for tasks assessed                                        General Comments      Exercises     Assessment/Plan    PT Assessment Patent does not need any further PT services  PT Problem List         PT Treatment Interventions      PT Goals (Current goals can be found in the Care Plan section)  Acute Rehab PT Goals Patient Stated Goal: to go home  PT Goal Formulation: With patient Time For Goal Achievement: 01/17/19 Potential to Achieve Goals: Good    Frequency     Barriers to discharge        Co-evaluation  AM-PAC PT "6 Clicks" Mobility  Outcome Measure Help needed turning from your back to your side while in a flat bed without using bedrails?: None Help needed moving from lying on your back to sitting on the side of a flat bed without using bedrails?: None Help needed moving to and from a bed to a chair (including a wheelchair)?: None Help needed standing up from a chair using your arms (e.g., wheelchair or bedside chair)?: None Help needed to walk in hospital room?: None Help needed climbing 3-5 steps with a railing? : None 6 Click Score: 24    End of Session   Activity Tolerance: Patient tolerated treatment well Patient left: in chair;with call bell/phone within reach Nurse Communication: Mobility status      Time: 8937-3428 PT Time Calculation (min) (ACUTE ONLY): 19 min   Charges:   PT Evaluation $PT Eval Low Complexity: 1 Low            Carney Living PT DPT  01/10/2019, 11:08 AM

## 2019-01-10 NOTE — Progress Notes (Signed)
ANTICOAGULATION CONSULT NOTE - Initial Consult  Pharmacy Consult for Warfarin  Indication: atrial fibrillation  Allergies  Allergen Reactions  . Norvasc [Amlodipine Besylate] Other (See Comments)    Weakness   . Symmetrel [Amantadine] Cough  . Hydrochlorothiazide Palpitations    Patient Measurements: Height: 5\' 8"  (172.7 cm) Weight: 145 lb (65.8 kg) IBW/kg (Calculated) : 68.4 Vital Signs: Temp: 98.5 F (36.9 C) (03/27 2035) Temp Source: Oral (03/27 2035) BP: 135/60 (03/27 2035) Pulse Rate: 79 (03/27 2035)  Labs: Recent Labs    01/09/19 2108  HGB 12.9*  HCT 38.4*  PLT 187  CREATININE 1.06    Estimated Creatinine Clearance: 36.2 mL/min (by C-G formula based on SCr of 1.06 mg/dL).   Medical History: Past Medical History:  Diagnosis Date  . Atrial fibrillation ([place])    with rapid ventricular repsonse  . Bone lesion    lytic  . Debility   . HTN (hypertension)   . S/P CABG x 4 with atrial clip 10/21/2013  . Small bowel obstruction [facility])     Assessment: 85 y/o M here with abdominal pain, on warfarin PTA for afib, no INR has been drawn yet  Goal of Therapy:  INR 2-3 Monitor platelets by anticoagulation protocol: Yes   Plan:  -INR with AM labs to assess dosing needs  Narda Bonds 01/10/2019,12:09 AM

## 2019-01-10 NOTE — Progress Notes (Signed)
Pt being discharged with not changed to medications dis pack given to [place] transported by family friend.

## 2019-01-10 NOTE — Progress Notes (Signed)
CRITICAL VALUE ALERT  Critical Value:  Troponin: 0.04  Date & Time Notied:  01/10/2019, 9030  Provider Notified: [facility]  Orders Received/Actions taken: awaiting

## 2019-01-10 NOTE — Progress Notes (Signed)
Pt has arrived onto unit [address]. Pt is alert and oriented but sometimes forgetful. Pt states he does want to talk to the MD in the morning about going home. Pt vitals are stable. Pt skin assessment complete. Pt in no acute distress. Will continue to monitor pt.

## 2019-01-10 NOTE — ED Notes (Signed)
Report called to rn on 3e 

## 2019-01-10 NOTE — Progress Notes (Signed)
Pt states  I am dying for a sip of water I have not had anything all day". MD notified. Waiting on orders. Will continue to monitor.

## 2019-01-10 NOTE — Plan of Care (Signed)
  Problem: Education: Goal: Knowledge of General Education information will improve Description Including pain rating scale, medication(s)/side effects and non-pharmacologic comfort measures Outcome: Progressing   Problem: Health Behavior/Discharge Planning: Goal: Ability to manage health-related needs will improve Outcome: Progressing   Problem: Clinical Measurements: Goal: Ability to maintain clinical measurements within normal limits will improve Outcome: Progressing Goal: Will remain free from infection Outcome: Progressing Goal: Respiratory complications will improve Outcome: Progressing Goal: Cardiovascular complication will be avoided Outcome: Progressing   Problem: Activity: Goal: Risk for activity intolerance will decrease Outcome: Progressing   Problem: Nutrition: Goal: Adequate nutrition will be maintained Outcome: Progressing   Problem: Elimination: Goal: Will not experience complications related to bowel motility Outcome: Progressing Goal: Will not experience complications related to urinary retention Outcome: Progressing   Problem: Pain Managment: Goal: General experience of comfort will improve Outcome: Progressing   Problem: Safety: Goal: Ability to remain free from injury will improve Outcome: Progressing   Problem: Skin Integrity: Goal: Risk for impaired skin integrity will decrease Outcome: Progressing   Problem: Education: Goal: Ability to demonstrate management of disease process will improve Outcome: Progressing Goal: Ability to verbalize understanding of medication therapies will improve Outcome: Progressing   Problem: Activity: Goal: Capacity to carry out activities will improve Outcome: Progressing   Problem: Cardiac: Goal: Ability to achieve and maintain adequate cardiopulmonary perfusion will improve Outcome: Progressing

## 2019-01-10 NOTE — Discharge Summary (Addendum)
Physician Discharge Summary  [name] DJM:[id number] DOB: 02/17/1918 DOA: 01/09/2019  PCP: Brent Manes, MD  Admit date: 01/09/2019 Discharge date: 01/10/2019  Time spent: 45 minutes  Recommendations for Outpatient Follow-up:  1. Call PCP 1-2 weeks for evaluation of elevated liver function tests.  2. Gastroenterology referral made via epic.  3. Back to [place] at [facility] 4. Cardiology will contact patient for follow up   Discharge Diagnoses:  Principal Problem:   Chest pain Active Problems:   Abdominal pain   Elevated LFTs   Essential hypertension, benign   CAD (coronary artery disease)   Abnormal CT of the chest   Discharge Condition: stable and pain free  Diet recommendation: heart healthy low fat small frequent meals  Filed Weights   01/09/19 2044 01/10/19 0106  Weight: 65.8 kg 62.5 kg    History of present illness:   [name]  is a 85 y.o. male, with past medical history significant for A. fib, hypertension and CAD status post CABG x4 presenting on 01/09/19 with 1 day history of epigastric/left-sided chest pain that started at noon.  No associated cold sweats nausea vomiting shortness of breath.  No lower extremity edema.  No history of fever/chills or cough. Doing his evaluation in the emergency room, patient was noted to have elevated liver function tests, CT of abdomen was negative except for sludge in the gallbladder and asbestosis like plaques in his lower chest. His EKG showed A. fib/flutter with controlled rate and no acute changes  Hospital Course:   Chest pain with significant history of CAD. Troponin slightly elevated and trended up slightly likely related to dehydration as pt npo since presentation 12 hours ago. EKG without acute changes. No further pain/discomfort. Pain likely related to gallbladder. Of note saw cardiology yesterday. No changes in management  Elevated liver function tests and lipase.  Patient's pain was epigastric/left chest.  LFT's trending up some what. Pain resolved. CT showed only sludge with no signs of cholecystitis. Ultrasound of liver and gallbladder reveals layering stones or sludge in gallbladder with no secondary finding of cholecystitis. Patient pain free tolerating diet and adamant about being discharged. Will discharge and advised he call PCP Monday for close OP follow up regarding elevated LFT. Patient verbalized understanding and agreement. Made GI refferral via epic  Atrial flutter/fib-chronic.Controlled rate continue with Cardizem, Lopressor. Continue with Coumadin  Hypertension Controlled with Cardizem, Lopressor and losartan  Lung abnormalities with calcified pleural plaques consistent with prior asbestos exposure.   Observe Procedures:  Consultations:    Discharge Exam: Vitals:   01/10/19 0441 01/10/19 0947  BP: 123/62 134/80  Pulse: 86 90  Resp: 20 16  Temp: 98.4 F (36.9 C) (!) 97.5 F (36.4 C)  SpO2: 95% 97%    General: awake alert oriented x3 Cardiovascular: irregularly irregular no MGR no LE edema Respiratory: normal effort BS clear bilaterally no wheeze  Discharge Instructions   Discharge Instructions    Ambulatory referral to Gastroenterology   Complete by:  As directed    Ambulatory referral to Gastroenterology   Complete by:  As directed    Elevated LFT's, gallbladder sludge.   Call MD for:  persistant nausea and vomiting   Complete by:  As directed    Call MD for:  severe uncontrolled pain   Complete by:  As directed    Call MD for:  temperature >100.4   Complete by:  As directed    Diet - low sodium heart healthy   Complete by:  As  directed    Discharge instructions   Complete by:  As directed    Avoid fatty foods Eat small frequent meals Return to ED if symptoms return Follow up with PCP 1-2 weeks for evaluation of gallbladder/elevated liver function test.   Increase activity slowly   Complete by:  As directed      Allergies as of 01/10/2019       Reactions   Norvasc [amlodipine Besylate] Other (See Comments)   Weakness   Symmetrel [amantadine] Cough   Hydrochlorothiazide Palpitations      Medication List    TAKE these medications   diltiazem 240 MG 24 hr capsule Commonly known as:  CARDIZEM CD Take 240 mg by mouth daily.   furosemide 40 MG tablet Commonly known as:  LASIX Take 1 tablet (40 mg total) by mouth 2 (two) times daily. NEED OV.   losartan 25 MG tablet Commonly known as:  COZAAR TAKE 1 TABLET BY MOUTH EVERY DAY   metoprolol succinate 50 MG 24 hr tablet Commonly known as:  TOPROL-XL TAKE 1 TABLET BY MOUTH DAILY   multivitamin tablet Take 1 tablet by mouth daily.   potassium chloride SA 20 MEQ tablet Commonly known as:  K-DUR,KLOR-CON TAKE 1 TABLET(20 MEQ) BY MOUTH DAILY. CONTACT OUT OFFICE FOR AN APPOINTMENT   warfarin 2 MG tablet Commonly known as:  COUMADIN Take 3 mg by mouth daily. OR AS DIRECTED BY COUMADIN CLINIC 4 mg 2 days then 3 mg 1 day alternating      Allergies  Allergen Reactions  . Norvasc [Amlodipine Besylate] Other (See Comments)    Weakness   . Symmetrel [Amantadine] Cough  . Hydrochlorothiazide Palpitations   Follow-up Information    Brent Breed Denice Bors, MD.   Specialty:  Cardiology Why:  Office will call for appointment Contact information: [place] [address] [phone]        Brent Manes, MD.   Specialty:  Internal Medicine Why:  Please call for appointment Contact information: [address]  [place] [address] [phone]            The results of significant diagnostics from this hospitalization (including imaging, microbiology, ancillary and laboratory) are listed below for reference.    Significant Diagnostic Studies: Ct Abdomen Pelvis W Contrast  Result Date: 01/09/2019 CLINICAL DATA:  Abdominal pain since earlier today. Elevated liver function tests. EXAM: CT ABDOMEN AND PELVIS WITH CONTRAST  TECHNIQUE: Multidetector CT imaging of the abdomen and pelvis was performed using the standard protocol following bolus administration of intravenous contrast. CONTRAST:  130mL OMNIPAQUE IOHEXOL 300 MG/ML  SOLN COMPARISON:  Head CT 08/18/2015 FINDINGS: Lower chest: Cardiomegaly with coronary arteriosclerosis and aortic atherosclerosis. The patient is status post CABG. Calcified pleural plaque along the periphery of both lungs are identified with new rounded masslike opacities at each lung base compatible with rounded atelectasis given what appears to be prior asbestos exposure. The largest opacity at the right lung base measures at least 5.8 x 4.4 x 3.8 cm. Slightly more oblong opacity at the left lung base is noted measuring at least 5.4 x 3.5 x 4.1 cm. A lingular opacity which is partially included is again noted and based on prior imaging is contiguous with the pleural thickening. Hepatobiliary: 3 mm hypodensities in the right hepatic lobe too small to further characterize are identified statistically consistent with cysts. The gallbladder is physiologically distended. There may be some minimal internal debris without definite calculus or secondary signs  of acute cholecystitis. Pancreas: No inflammation, ductal dilatation or mass. Spleen: Normal Adrenals/Urinary Tract: Normal bilateral adrenal glands. Subcentimeter hypodensities within both kidneys statistically consistent with cysts. No nephrolithiasis nor obstructive uropathy. No hydroureteronephrosis. The urinary bladder is unremarkable for the degree of distention. Stomach/Bowel: Small hiatal hernia. Physiologic distention of the stomach. The duodenal sweep and ligament of Treitz are normal. No small bowel obstruction or inflammation. Moderate stool retention within the colon. The appendix is normal. Vascular/Lymphatic: Marked aortoiliac and branch vessel atherosclerosis without aneurysm, dissection or significant stenosis. No lymphadenopathy. Reproductive:  Enlarged prostate measuring up to 5 cm transverse impressing upon the base of the bladder. Other: No abdominal wall hernia or abnormality. No abdominopelvic ascites. Musculoskeletal: Thoracolumbar spondylosis with marked disc space narrowing T8 through S1. IMPRESSION: 1. Calcified pleural plaque are redemonstrated. Rounded masslike opacities at each lung base are most consistent with rounded atelectasis which can be seen in the setting of prior asbestos exposure. Follow-up dedicated chest CT may help assess for disease elsewhere within the chest and to assure stability. 2. Tiny too small to characterize hypodensities within the liver and both kidneys statistically consistent with cysts. 3. Prostatomegaly. 4. Thoracolumbar spondylosis. 5. No acute bowel obstruction or inflammation. 6. Probable small volume of biliary sludge within the gallbladder without secondary signs of acute cholecystitis. Electronically Signed   By: Ashley Royalty M.D.   On: 01/09/2019 23:34   Dg Chest Port 1 View  Result Date: 01/09/2019 CLINICAL DATA:  85 year old with chest pain. EXAM: PORTABLE CHEST 1 VIEW COMPARISON:  Frontal and lateral views 09/18/2016 FINDINGS: Post median sternotomy and left atrial clipping. Mild cardiomegaly is similar to prior exam. Unchanged mediastinal contours with aortic atherosclerosis. Again seen bilateral calcified pleural plaques. Chronic left pleural effusion has decreased from prior exam. No evidence of acute airspace disease. No pulmonary edema. IMPRESSION: 1. No acute findings. 2. Chronic left pleural effusion, decreased from prior exam. Mild cardiomegaly also unchanged. 3. Bilateral calcified pleural plaques. Electronically Signed   By: Keith Rake M.D.   On: 01/09/2019 21:48   US Abdomen Limited Ruq  Result Date: 01/10/2019 CLINICAL DATA:  Elevated LFTs. EXAM: ULTRASOUND ABDOMEN LIMITED RIGHT UPPER QUADRANT COMPARISON:  Abdominal CT yesterday. FINDINGS: Gallbladder: Physiologically distended.  No wall thickening. Layering echogenic debris in the gallbladder likely combination of small stones and sludge. No sonographic Murphy sign noted by sonographer. Common bile duct: Diameter: 4 mm. Liver: No focal lesion identified. Within normal limits in parenchymal echogenicity. Portal vein is patent on color Doppler imaging with normal direction of blood flow towards the liver. Incidental right pleural effusion. IMPRESSION: 1. Layering stones or sludge in the gallbladder. No secondary findings of cholecystitis. 2. No explanation for elevated LFTs. Electronically Signed   By: Keith Rake M.D.   On: 01/10/2019 00:36    Microbiology: Recent Results (from the past 240 hour(s))  MRSA PCR Screening     Status: None   Collection Time: 01/10/19  1:37 AM  Result Value Ref Range Status   MRSA by PCR NEGATIVE NEGATIVE Final    Comment:        The GeneXpert MRSA Assay (FDA approved for NASAL specimens only), is one component of a comprehensive MRSA colonization surveillance program. It is not intended to diagnose MRSA infection nor to guide or monitor treatment for MRSA infections. Performed at [facility], [place] [address] [place], [place] [address]      Labs: Basic Metabolic Panel: Recent Labs  Lab 01/09/19 2108 01/10/19 0214  NA  135 135  K 3.9 3.2*  CL 98 101  CO2 24 22  GLUCOSE 173* 150*  BUN 16 17  CREATININE 1.06 1.03  CALCIUM 9.4 9.3   Liver Function Tests: Recent Labs  Lab 01/09/19 2108 01/10/19 0214  AST 406* 700*  ALT 213* 463*  ALKPHOS 170* 171*  BILITOT 2.4* 3.3*  PROT 6.8 6.2*  ALBUMIN 3.7 3.4*   Recent Labs  Lab 01/09/19 2108  LIPASE 74*   No results for input(s): AMMONIA in the last 168 hours. CBC: Recent Labs  Lab 01/09/19 2108  WBC 7.8  HGB 12.9*  HCT 38.4*  MCV 95.0  PLT 187   Cardiac Enzymes: Recent Labs  Lab 01/10/19 0214 01/10/19 0704 01/10/19 0834  TROPONINI 0.04* 0.05* 0.06*   BNP: BNP (last 3 results) No results for  input(s): BNP in the last 8760 hours.  ProBNP (last 3 results) No results for input(s): PROBNP in the last 8760 hours.  CBG: No results for input(s): GLUCAP in the last 168 hours.     SignedRadene Gunning NP [facility] 01/10/2019, 10:54 AM

## 2019-01-16 DIAGNOSIS — I10 Essential (primary) hypertension: Secondary | ICD-10-CM | POA: Diagnosis not present

## 2019-01-16 DIAGNOSIS — Z79899 Other long term (current) drug therapy: Secondary | ICD-10-CM | POA: Diagnosis not present

## 2019-01-16 DIAGNOSIS — R1033 Periumbilical pain: Secondary | ICD-10-CM | POA: Diagnosis not present

## 2019-01-16 DIAGNOSIS — I482 Chronic atrial fibrillation, unspecified: Secondary | ICD-10-CM | POA: Diagnosis not present

## 2019-01-20 ENCOUNTER — Telehealth: Payer: Self-pay | Admitting: Physician Assistant

## 2019-01-20 DIAGNOSIS — I482 Chronic atrial fibrillation, unspecified: Secondary | ICD-10-CM | POA: Diagnosis not present

## 2019-01-20 DIAGNOSIS — D649 Anemia, unspecified: Secondary | ICD-10-CM | POA: Diagnosis not present

## 2019-01-20 NOTE — Telephone Encounter (Signed)
Left message to call back to schedule appointment with Rosaria Ferries, PA.

## 2019-01-29 ENCOUNTER — Encounter: Payer: Self-pay | Admitting: Gastroenterology

## 2019-01-29 ENCOUNTER — Encounter: Payer: Self-pay | Admitting: Cardiology

## 2019-01-29 ENCOUNTER — Other Ambulatory Visit: Payer: Self-pay | Admitting: Cardiology

## 2019-01-29 ENCOUNTER — Other Ambulatory Visit: Payer: Self-pay

## 2019-01-29 ENCOUNTER — Telehealth (INDEPENDENT_AMBULATORY_CARE_PROVIDER_SITE_OTHER): Payer: Medicare Other | Admitting: Cardiology

## 2019-01-29 ENCOUNTER — Ambulatory Visit (INDEPENDENT_AMBULATORY_CARE_PROVIDER_SITE_OTHER): Payer: Medicare Other | Admitting: Gastroenterology

## 2019-01-29 VITALS — Ht 66.0 in | Wt 144.0 lb

## 2019-01-29 VITALS — BP 142/64 | HR 76 | Ht 66.0 in | Wt 144.0 lb

## 2019-01-29 DIAGNOSIS — I10 Essential (primary) hypertension: Secondary | ICD-10-CM | POA: Diagnosis not present

## 2019-01-29 DIAGNOSIS — I2510 Atherosclerotic heart disease of native coronary artery without angina pectoris: Secondary | ICD-10-CM | POA: Diagnosis not present

## 2019-01-29 DIAGNOSIS — R740 Nonspecific elevation of levels of transaminase and lactic acid dehydrogenase [LDH]: Secondary | ICD-10-CM | POA: Diagnosis not present

## 2019-01-29 DIAGNOSIS — K828 Other specified diseases of gallbladder: Secondary | ICD-10-CM

## 2019-01-29 DIAGNOSIS — R7401 Elevation of levels of liver transaminase levels: Secondary | ICD-10-CM

## 2019-01-29 DIAGNOSIS — I4821 Permanent atrial fibrillation: Secondary | ICD-10-CM

## 2019-01-29 NOTE — Telephone Encounter (Signed)
This encounter was created in error - please disregard.

## 2019-01-29 NOTE — Addendum Note (Signed)
Addended by: Wyline Beady on: 01/29/2019 04:07 PM   Modules accepted: Orders

## 2019-01-29 NOTE — Patient Instructions (Signed)
I will obtain your recent labs from Dr. Edison Pace. If your liver enzymes are normalizing, no additional evaluation will be needed.  Please let me know if I can help you in any way in the future.  Thank you for your patience with me and our technology today! Please stay home, safe, and healthy.

## 2019-01-29 NOTE — Telephone Encounter (Signed)
Furosemide 40 refilled.

## 2019-01-29 NOTE — Progress Notes (Addendum)
TELEHEALTH VISIT  Referring Provider: Radene Gunning, NP Primary Care Physician:  Lajean Manes, MD   Tele-visit due to COVID-19 pandemic Patient requested visit virtually, consented to the virtual encounter via voice enabled telemedicine application Contact made at: 12:35 pm 01/29/19 Patient verified by name and date of birth Location of patient: Home Location provider: [facility] Names of persons participating: Me, patient, [name] CMA Time spent on telehealth visit: 28 minutes I discussed the limitations of evaluation and management by telemedicine. The patient expressed understanding and agreed to proceed.  Reason for Consultation:  Abnormal liver enzymes   IMPRESSION:  Acute elevation in transaminases and mild elevation in the lipase Sludge in the gallbladder noted on recent ultrasound and CT scan 3 mm hypodensities in the right hepatic lobe too small to characterize but thought to be cysts on CT scan 01/09/2019  Acute elevation in liver enzymes and lipase in the setting of chest pain may be due to a passed gallstone.  Must also consider ischemia given the gradient between AST and ALT.  PLAN: Obtain recent labs from Dr. Leonides Schanz If not performed: hepatic function panel, GGTP, lipase, direct bilirubin No further evaluation needed if liver enzymes are normalizing   HPI: [name] is a 85 y.o. male referred in hospital follow-up abnormal liver enzymes.  The history is obtained to the patient and review of his electronic health record.  He has atrial fibrillation, hypertension and CAD status post CABG x4, and changes in his lungs on CT scan suggesting prior asbestos exposure.    He was recently hospitalized with acute chest pain and a slightly elevated troponin without significant signs of ischemia noted on EKG.  He was also found to have elevated liver enzymes and lipase.  No associated nausea, vomiting, or abdominal pain.  No other associated symptoms. No  identified exacerbating or relieving features.   CT scan showed a small volume of gallbladder sludge with no evidence for cholecystitis or pancreatitis. It also showed 3 mm hypodensities in the right hepatic lobe too small to characterize but thought to be cysts. Abdominal ultrasound showed layering stones or sludge in the gallbladder.  Although his liver enzymes were increasing, the patient's pain resolved quickly and he requested discharge.  He has had no recurrent pain since hospital discharge.  No history of elevated liver enzymes.  No history of similar symptoms.  No history of pancreatitis.  No prior blood donation.  No prior blood transfusion.  No history of jaundice or scleral icterus.  He drinks of bourbon water every day at 4 PM.  He denies any history of heavy alcohol use.  No history of DUI.  No history of use or experimentation with IV or intranasal street drugs.  No history of autoimmune disease.  No family history of liver disease.  He had a recent telephone follow-up visit with Dr. Felipa Eth his primary care provider.  Lab work was done at that time.  The patient believes his liver enzymes were normal.  He had normal liver enzymes in 2015 with an ALT of 11, AST 18, alk phos 50, total bilirubin bilirubin 0.7, albumin 2.8.  On 01/09/2019 his ALT was 213, AST 406, alk phos 170, total bilirubin 2.4, albumin 3.7, lipase 74.  Labs 01/10/2019 show a total bilirubin of 3.3, ALT 463, AST 700, alk phos 171, albumin 3.4.  No prior colonoscopy, endoscopy, or known colon cancer screening.  No known family history of colon cancer or polyps. No family history of  uterine/endometrial cancer, pancreatic cancer or gastric/stomach cancer.  Past Medical History:  Diagnosis Date  . Atrial fibrillation ([place])    with rapid ventricular repsonse  . Bone lesion    lytic  . Debility   . HTN (hypertension)   . S/P CABG x 4 with atrial clip 10/21/2013  . Small bowel obstruction [facility])     Past Surgical History:   Procedure Laterality Date  . APPENDECTOMY    . CLIPPING OF ATRIAL APPENDAGE  10/21/2013   Procedure: CLIPPING OF ATRIAL APPENDAGE;  Surgeon: Grace Isaac, MD;  Location: [place];  Service: Open Heart Surgery;;  . CORONARY ARTERY BYPASS GRAFT N/A 10/21/2013   Procedure: CORONARY ARTERY BYPASS GRAFTING (CABG);  Surgeon: Grace Isaac, MD;  Location: [place];  Service: Open Heart Surgery;  Laterality: N/A;  . HERNIA REPAIR     x2  . INTRAOPERATIVE TRANSESOPHAGEAL ECHOCARDIOGRAM N/A 10/21/2013   Procedure: INTRAOPERATIVE TRANSESOPHAGEAL ECHOCARDIOGRAM;  Surgeon: Grace Isaac, MD;  Location: [place];  Service: Open Heart Surgery;  Laterality: N/A;  . LEFT HEART CATHETERIZATION WITH CORONARY ANGIOGRAM N/A 10/20/2013   Procedure: LEFT HEART CATHETERIZATION WITH CORONARY ANGIOGRAM;  Surgeon: Troy Sine, MD;  Location: [facility] CATH LAB;  Service: Cardiovascular;  Laterality: N/A;  . PARATHYROIDECTOMY      Current Outpatient Medications  Medication Sig Dispense Refill  . diltiazem (CARDIZEM CD) 240 MG 24 hr capsule Take 240 mg by mouth daily.    [name] losartan (COZAAR) 25 MG tablet TAKE 1 TABLET BY MOUTH EVERY DAY 90 tablet 0  . metoprolol succinate (TOPROL-XL) 50 MG 24 hr tablet TAKE 1 TABLET BY MOUTH DAILY 30 tablet 11  . Multiple Vitamin (MULTIVITAMIN) tablet Take 1 tablet by mouth daily.     . potassium chloride SA (K-DUR,KLOR-CON) 20 MEQ tablet TAKE 1 TABLET(20 MEQ) BY MOUTH DAILY. CONTACT OUT OFFICE FOR AN APPOINTMENT 30 tablet 1  . warfarin (COUMADIN) 2 MG tablet Take 3 mg by mouth daily. OR AS DIRECTED BY COUMADIN CLINIC 4 mg 2 days then 3 mg 1 day alternating    . furosemide (LASIX) 40 MG tablet TAKE 1 TABLET(40 MG) BY MOUTH TWICE DAILY 180 tablet 0   No current facility-administered medications for this visit.     Allergies as of 01/29/2019 - Review Complete 01/29/2019  Allergen Reaction Noted  . Norvasc [amlodipine besylate] Other (See Comments) 07/03/2018  . Symmetrel [amantadine] Cough  07/03/2018  . Hydrochlorothiazide Palpitations 07/03/2018    History reviewed. No pertinent family history.  Social History   Socioeconomic History  . Marital status: Married    Spouse name: Not on file  . Number of children: Not on file  . Years of education: Not on file  . Highest education level: Not on file  Occupational History  . Occupation: retired    Fish farm manager: RETIRED  Social Needs  . Financial resource strain: Not on file  . Food insecurity:    Worry: Not on file    Inability: Not on file  . Transportation needs:    Medical: Not on file    Non-medical: Not on file  Tobacco Use  . Smoking status: Former Research scientist (life sciences)  . Smokeless tobacco: Never Used  . Tobacco comment: During WWII  Substance and Sexual Activity  . Alcohol use: Yes    Alcohol/week: 7.0 standard drinks    Types: 7 Glasses of wine per week  . Drug use: No  . Sexual activity: Not on file  Lifestyle  . Physical activity:  Days per week: Not on file    Minutes per session: Not on file  . Stress: Not on file  Relationships  . Social connections:    Talks on phone: Not on file    Gets together: Not on file    Attends religious service: Not on file    Active member of club or organization: Not on file    Attends meetings of clubs or organizations: Not on file    Relationship status: Not on file  . Intimate partner violence:    Fear of current or ex partner: Not on file    Emotionally abused: Not on file    Physically abused: Not on file    Forced sexual activity: Not on file  Other Topics Concern  . Not on file  Social History Narrative   Retired from the railroad.  One child    Review of Systems: ALL ROS discussed and all others negative except listed in HPI.  Physical Exam: General: in no acute distress Neuro: Alert and appropriate Psych: Normal affect and normal insight Exam otherwise limited due to telehealth technology.  Elany Felix L. Tarri Glenn, MD, MPH [facility]  01/29/2019, 1:27 PM

## 2019-01-29 NOTE — Patient Instructions (Signed)

## 2019-01-29 NOTE — Progress Notes (Signed)
Virtual Visit via Phone Note; pt does not have a smart phone.   This visit type was conducted due to national recommendations for restrictions regarding the COVID-19 Pandemic (e.g. social distancing) in an effort to limit this patient's exposure and mitigate transmission in our community.  Due to his co-morbid illnesses, this patient is at least at moderate risk for complications without adequate follow up.  This format is felt to be most appropriate for this patient at this time.  All issues noted in this document were discussed and addressed.  Please refer to the patient's chart for his consent to telehealth for [facility].   Evaluation Performed:  Follow-up visit  Date:  01/29/2019   ID:  [name], DOB 04/17/1918, MRN [id number]  Patient Location: Home Provider Location: Home  PCP:  Lajean Manes, MD  Cardiologist:  Dr Stanford Breed Electrophysiologist:  None   Chief Complaint:  CAD  History of Present Illness:    FU coronary artery disease and atrial fibrillation. Cardiac catheterization in January of 2015 revealed severe coronary artery disease including an 80-90% distal left main. Ejection fraction was 35%. Preoperative carotid Dopplers in January of 2015 showed 1-39% bilateral stenosis. The patient underwent coronary artery bypassing graft with a LIMA to the LAD, saphenous vein graft to the obtuse marginal and circumflex and saphenous vein graft to the right coronary artery. He also had placement of atrial clip. He elected rate control and anticoagulation for afib. Holter monitor in February of 2015 revealed atrial fibrillation with controlled ventricular response. Echocardiogram repeated in July 2015 and showed ejection fraction 50-55%, moderate left atrial enlargement and trace aortic insufficiency.  Patient fell September 2019.    Patient admitted March 2020 with chest pain.  Abdominal ultrasound showed stones or sludge in the gallbladder but no findings of cholecystitis.   Abdominal CT showed opacities in each lung base felt to be atelectasis, cysts in the liver and kidney.  They showed chronic left pleural effusion and calcified pleural plaques. Troponins 0.06, 0.05 and 0.04.  SGOT 700, SGPT 463, alkaline phosphatase 171 and bilirubin 3.3.  INR 2.2.  Patient's pain resolved and elevated liver functions felt possibly secondary to transient obstruction.  Since he was last seen discharged,   The patient does not have symptoms concerning for COVID-19 infection (fever, chills, cough, or new shortness of breath).    Past Medical History:  Diagnosis Date   Atrial fibrillation ([place])    with rapid ventricular repsonse   Bone lesion    lytic   Debility    HTN (hypertension)    S/P CABG x 4 with atrial clip 10/21/2013   Small bowel obstruction [facility])    Past Surgical History:  Procedure Laterality Date   APPENDECTOMY     CLIPPING OF ATRIAL APPENDAGE  10/21/2013   Procedure: CLIPPING OF ATRIAL APPENDAGE;  Surgeon: Grace Isaac, MD;  Location: [place];  Service: Open Heart Surgery;;   CORONARY ARTERY BYPASS GRAFT N/A 10/21/2013   Procedure: CORONARY ARTERY BYPASS GRAFTING (CABG);  Surgeon: Grace Isaac, MD;  Location: [place];  Service: Open Heart Surgery;  Laterality: N/A;   HERNIA REPAIR     x2   INTRAOPERATIVE TRANSESOPHAGEAL ECHOCARDIOGRAM N/A 10/21/2013   Procedure: INTRAOPERATIVE TRANSESOPHAGEAL ECHOCARDIOGRAM;  Surgeon: Grace Isaac, MD;  Location: [place];  Service: Open Heart Surgery;  Laterality: N/A;   LEFT HEART CATHETERIZATION WITH CORONARY ANGIOGRAM N/A 10/20/2013   Procedure: LEFT HEART CATHETERIZATION WITH CORONARY ANGIOGRAM;  Surgeon: Troy Sine, MD;  Location: [place] CATH LAB;  Service: Cardiovascular;  Laterality: N/A;   PARATHYROIDECTOMY       Current Meds  Medication Sig   diltiazem (CARDIZEM CD) 240 MG 24 hr capsule Take 240 mg by mouth daily.   furosemide (LASIX) 40 MG tablet Take 1 tablet (40 mg total) by mouth 2 (two) times  daily. NEED OV. (Patient taking differently: Take 40 mg by mouth daily. )   losartan (COZAAR) 25 MG tablet TAKE 1 TABLET BY MOUTH EVERY DAY   metoprolol succinate (TOPROL-XL) 50 MG 24 hr tablet TAKE 1 TABLET BY MOUTH DAILY   Multiple Vitamin (MULTIVITAMIN) tablet Take 1 tablet by mouth daily.    potassium chloride SA (K-DUR,KLOR-CON) 20 MEQ tablet TAKE 1 TABLET(20 MEQ) BY MOUTH DAILY. CONTACT OUT OFFICE FOR AN APPOINTMENT   warfarin (COUMADIN) 2 MG tablet Take 3 mg by mouth daily. OR AS DIRECTED BY COUMADIN CLINIC 4 mg 2 days then 3 mg 1 day alternating     Allergies:   Norvasc [amlodipine besylate]; Symmetrel [amantadine]; and Hydrochlorothiazide   Social History   Tobacco Use   Smoking status: Former Smoker   Smokeless tobacco: Never Used   Tobacco comment: During WWII  Substance Use Topics   Alcohol use: Yes    Alcohol/week: 7.0 standard drinks    Types: 7 Glasses of wine per week   Drug use: No     Family Hx: The patient's family history is not on file.  ROS:   Please see the history of present illness.    No fevers, chills, productive cough.  No recurrent epigastric pain. All other systems reviewed and are negative.  Labs/Other Tests and Data Reviewed:    EKG: January 09, 2019-atrial fibrillation, left axis deviation, RV conduction delay, left ventricular hypertrophy.  Recent Labs: 01/09/2019: Hemoglobin 12.9; Platelets 187 01/10/2019: ALT 463; BUN 17; Creatinine, Ser 1.03; Potassium 3.2; Sodium 135    Wt Readings from Last 3 Encounters:  01/29/19 144 lb (65.3 kg)  01/10/19 137 lb 11.2 oz (62.5 kg)  12/19/18 144 lb (65.3 kg)     Objective:    Vital Signs:  BP (!) 142/64    Pulse 76    Ht 5\' 6"  (1.676 m)    Wt 144 lb (65.3 kg)    BMI 23.24 kg/m    Well nourished, well developed male in no acute distress. Remainder of physical exam not performed (telehealth visit; coronavirus pandemic)  ASSESSMENT & PLAN:    1. Recent epigastric pain-liver functions  noted to be elevated at time.  Abdominal ultrasound and CT apparently not consistent with cholecystitis.  Patient will need follow-up with primary care and gastroenterology for follow-up liver functions and evaluation. 2. Permanent atrial fibrillation-plan to continue present dose of Cardizem and metoprolol for rate control.  Continue Coumadin.  As outlined previously he was not interested in DOACs. 3. Coronary artery disease-patient is not on aspirin given need for Coumadin.  He declined statins previously and we would not initiate in the setting of liver functions regardless. 4. Hypertension-patient's blood pressure has been controlled.  Continue present medications and follow. 5. History of ischemic cardiomyopathy-LV function has improved on most recent echocardiogram.  Continue present dose of ARB and beta-blocker.  COVID-19 Education: The importance of social distancing was discussed today.  Time:   Today, I have spent 15 minutes with the patient with telehealth technology discussing the above problems.     Medication Adjustments/Labs and Tests Ordered: Current medicines are reviewed at length with the  patient today.  Concerns regarding medicines are outlined above.   Tests Ordered: No orders of the defined types were placed in this encounter.   Medication Changes: No orders of the defined types were placed in this encounter.   Disposition:  Follow up 12 months  Signed, Kirk Ruths, MD  01/29/2019 9:15 AM    [place]

## 2019-01-29 NOTE — Telephone Encounter (Signed)
New Message:   Nurse from [facility] wants to know if you want pt to do a Virtual Visit today instead of telephone visit. She will be there with her Iphone.

## 2019-02-02 ENCOUNTER — Other Ambulatory Visit: Payer: Self-pay | Admitting: Cardiology

## 2019-02-19 DIAGNOSIS — I4891 Unspecified atrial fibrillation: Secondary | ICD-10-CM | POA: Diagnosis not present

## 2019-02-19 DIAGNOSIS — D649 Anemia, unspecified: Secondary | ICD-10-CM | POA: Diagnosis not present

## 2019-03-03 DIAGNOSIS — H1131 Conjunctival hemorrhage, right eye: Secondary | ICD-10-CM | POA: Diagnosis not present

## 2019-03-23 DIAGNOSIS — I4891 Unspecified atrial fibrillation: Secondary | ICD-10-CM | POA: Diagnosis not present

## 2019-03-23 DIAGNOSIS — Z79899 Other long term (current) drug therapy: Secondary | ICD-10-CM | POA: Diagnosis not present

## 2019-04-13 DIAGNOSIS — I4891 Unspecified atrial fibrillation: Secondary | ICD-10-CM | POA: Diagnosis not present

## 2019-04-13 DIAGNOSIS — Z79899 Other long term (current) drug therapy: Secondary | ICD-10-CM | POA: Diagnosis not present

## 2019-04-26 ENCOUNTER — Other Ambulatory Visit: Payer: Self-pay | Admitting: Cardiology

## 2019-05-14 DIAGNOSIS — Z79899 Other long term (current) drug therapy: Secondary | ICD-10-CM | POA: Diagnosis not present

## 2019-05-14 DIAGNOSIS — I4891 Unspecified atrial fibrillation: Secondary | ICD-10-CM | POA: Diagnosis not present

## 2019-06-09 DIAGNOSIS — Z79899 Other long term (current) drug therapy: Secondary | ICD-10-CM | POA: Diagnosis not present

## 2019-06-09 DIAGNOSIS — I4891 Unspecified atrial fibrillation: Secondary | ICD-10-CM | POA: Diagnosis not present

## 2019-07-07 DIAGNOSIS — I482 Chronic atrial fibrillation, unspecified: Secondary | ICD-10-CM | POA: Diagnosis not present

## 2019-07-07 DIAGNOSIS — D649 Anemia, unspecified: Secondary | ICD-10-CM | POA: Diagnosis not present

## 2019-07-07 DIAGNOSIS — Z79899 Other long term (current) drug therapy: Secondary | ICD-10-CM | POA: Diagnosis not present

## 2019-07-23 DIAGNOSIS — C4441 Basal cell carcinoma of skin of scalp and neck: Secondary | ICD-10-CM | POA: Diagnosis not present

## 2019-07-23 DIAGNOSIS — Z85828 Personal history of other malignant neoplasm of skin: Secondary | ICD-10-CM | POA: Diagnosis not present

## 2019-07-23 DIAGNOSIS — D485 Neoplasm of uncertain behavior of skin: Secondary | ICD-10-CM | POA: Diagnosis not present

## 2019-07-23 DIAGNOSIS — D0439 Carcinoma in situ of skin of other parts of face: Secondary | ICD-10-CM | POA: Diagnosis not present

## 2019-08-04 DIAGNOSIS — I482 Chronic atrial fibrillation, unspecified: Secondary | ICD-10-CM | POA: Diagnosis not present

## 2019-08-12 DIAGNOSIS — I10 Essential (primary) hypertension: Secondary | ICD-10-CM | POA: Diagnosis not present

## 2019-08-12 DIAGNOSIS — I482 Chronic atrial fibrillation, unspecified: Secondary | ICD-10-CM | POA: Diagnosis not present

## 2019-08-12 DIAGNOSIS — D6869 Other thrombophilia: Secondary | ICD-10-CM | POA: Diagnosis not present

## 2019-08-12 DIAGNOSIS — Z79899 Other long term (current) drug therapy: Secondary | ICD-10-CM | POA: Diagnosis not present

## 2019-08-27 DIAGNOSIS — R945 Abnormal results of liver function studies: Secondary | ICD-10-CM | POA: Diagnosis not present

## 2019-08-27 DIAGNOSIS — Z0189 Encounter for other specified special examinations: Secondary | ICD-10-CM | POA: Diagnosis not present

## 2019-09-21 DIAGNOSIS — H52223 Regular astigmatism, bilateral: Secondary | ICD-10-CM | POA: Diagnosis not present

## 2019-10-02 DIAGNOSIS — I482 Chronic atrial fibrillation, unspecified: Secondary | ICD-10-CM | POA: Diagnosis not present

## 2019-10-02 DIAGNOSIS — Z0000 Encounter for general adult medical examination without abnormal findings: Secondary | ICD-10-CM | POA: Diagnosis not present

## 2019-10-02 DIAGNOSIS — I10 Essential (primary) hypertension: Secondary | ICD-10-CM | POA: Diagnosis not present

## 2019-10-02 DIAGNOSIS — J61 Pneumoconiosis due to asbestos and other mineral fibers: Secondary | ICD-10-CM | POA: Diagnosis not present

## 2019-10-06 DIAGNOSIS — Z79899 Other long term (current) drug therapy: Secondary | ICD-10-CM | POA: Diagnosis not present

## 2019-10-06 DIAGNOSIS — I4891 Unspecified atrial fibrillation: Secondary | ICD-10-CM | POA: Diagnosis not present

## 2019-10-29 DIAGNOSIS — L821 Other seborrheic keratosis: Secondary | ICD-10-CM | POA: Diagnosis not present

## 2019-10-29 DIAGNOSIS — D485 Neoplasm of uncertain behavior of skin: Secondary | ICD-10-CM | POA: Diagnosis not present

## 2019-10-29 DIAGNOSIS — Z85828 Personal history of other malignant neoplasm of skin: Secondary | ICD-10-CM | POA: Diagnosis not present

## 2019-10-29 DIAGNOSIS — C44329 Squamous cell carcinoma of skin of other parts of face: Secondary | ICD-10-CM | POA: Diagnosis not present

## 2019-10-29 DIAGNOSIS — L570 Actinic keratosis: Secondary | ICD-10-CM | POA: Diagnosis not present

## 2019-10-29 DIAGNOSIS — D0439 Carcinoma in situ of skin of other parts of face: Secondary | ICD-10-CM | POA: Diagnosis not present

## 2019-11-03 DIAGNOSIS — I482 Chronic atrial fibrillation, unspecified: Secondary | ICD-10-CM | POA: Diagnosis not present

## 2019-11-10 DIAGNOSIS — Z79899 Other long term (current) drug therapy: Secondary | ICD-10-CM | POA: Diagnosis not present

## 2019-11-10 DIAGNOSIS — I4891 Unspecified atrial fibrillation: Secondary | ICD-10-CM | POA: Diagnosis not present

## 2019-11-19 DIAGNOSIS — R319 Hematuria, unspecified: Secondary | ICD-10-CM | POA: Diagnosis not present

## 2019-11-19 DIAGNOSIS — N390 Urinary tract infection, site not specified: Secondary | ICD-10-CM | POA: Diagnosis not present

## 2019-11-19 DIAGNOSIS — R410 Disorientation, unspecified: Secondary | ICD-10-CM | POA: Diagnosis not present

## 2019-11-24 DIAGNOSIS — Z79899 Other long term (current) drug therapy: Secondary | ICD-10-CM | POA: Diagnosis not present

## 2019-11-24 DIAGNOSIS — I4891 Unspecified atrial fibrillation: Secondary | ICD-10-CM | POA: Diagnosis not present

## 2019-12-08 DIAGNOSIS — Z79899 Other long term (current) drug therapy: Secondary | ICD-10-CM | POA: Diagnosis not present

## 2019-12-08 DIAGNOSIS — I4891 Unspecified atrial fibrillation: Secondary | ICD-10-CM | POA: Diagnosis not present

## 2019-12-18 ENCOUNTER — Other Ambulatory Visit: Payer: Self-pay

## 2019-12-18 MED ORDER — METOPROLOL SUCCINATE ER 50 MG PO TB24: 50.0000 mg | ORAL_TABLET | Freq: Every day | ORAL | 1 refills | Status: DC

## 2019-12-22 DIAGNOSIS — Z79899 Other long term (current) drug therapy: Secondary | ICD-10-CM | POA: Diagnosis not present

## 2019-12-22 DIAGNOSIS — I4891 Unspecified atrial fibrillation: Secondary | ICD-10-CM | POA: Diagnosis not present

## 2020-01-07 DIAGNOSIS — Z79899 Other long term (current) drug therapy: Secondary | ICD-10-CM | POA: Diagnosis not present

## 2020-01-07 DIAGNOSIS — I4891 Unspecified atrial fibrillation: Secondary | ICD-10-CM | POA: Diagnosis not present

## 2020-01-11 DIAGNOSIS — I482 Chronic atrial fibrillation, unspecified: Secondary | ICD-10-CM | POA: Diagnosis not present

## 2020-01-14 DIAGNOSIS — M6281 Muscle weakness (generalized): Secondary | ICD-10-CM | POA: Diagnosis not present

## 2020-01-14 DIAGNOSIS — Z7409 Other reduced mobility: Secondary | ICD-10-CM | POA: Diagnosis not present

## 2020-01-18 DIAGNOSIS — I482 Chronic atrial fibrillation, unspecified: Secondary | ICD-10-CM | POA: Diagnosis not present

## 2020-01-21 ENCOUNTER — Other Ambulatory Visit: Payer: Self-pay | Admitting: Cardiology

## 2020-01-21 DIAGNOSIS — M6281 Muscle weakness (generalized): Secondary | ICD-10-CM | POA: Diagnosis not present

## 2020-01-21 DIAGNOSIS — Z7409 Other reduced mobility: Secondary | ICD-10-CM | POA: Diagnosis not present

## 2020-01-22 ENCOUNTER — Other Ambulatory Visit: Payer: Self-pay | Admitting: Cardiology

## 2020-01-22 DIAGNOSIS — M6281 Muscle weakness (generalized): Secondary | ICD-10-CM | POA: Diagnosis not present

## 2020-01-22 DIAGNOSIS — Z7409 Other reduced mobility: Secondary | ICD-10-CM | POA: Diagnosis not present

## 2020-01-26 DIAGNOSIS — Z7409 Other reduced mobility: Secondary | ICD-10-CM | POA: Diagnosis not present

## 2020-01-26 DIAGNOSIS — M6281 Muscle weakness (generalized): Secondary | ICD-10-CM | POA: Diagnosis not present

## 2020-01-29 DIAGNOSIS — Z7409 Other reduced mobility: Secondary | ICD-10-CM | POA: Diagnosis not present

## 2020-01-29 DIAGNOSIS — M6281 Muscle weakness (generalized): Secondary | ICD-10-CM | POA: Diagnosis not present

## 2020-02-01 DIAGNOSIS — Z7409 Other reduced mobility: Secondary | ICD-10-CM | POA: Diagnosis not present

## 2020-02-01 DIAGNOSIS — M6281 Muscle weakness (generalized): Secondary | ICD-10-CM | POA: Diagnosis not present

## 2020-02-03 DIAGNOSIS — M6281 Muscle weakness (generalized): Secondary | ICD-10-CM | POA: Diagnosis not present

## 2020-02-03 DIAGNOSIS — Z7409 Other reduced mobility: Secondary | ICD-10-CM | POA: Diagnosis not present

## 2020-02-05 DIAGNOSIS — Z7409 Other reduced mobility: Secondary | ICD-10-CM | POA: Diagnosis not present

## 2020-02-05 DIAGNOSIS — M6281 Muscle weakness (generalized): Secondary | ICD-10-CM | POA: Diagnosis not present

## 2020-02-08 DIAGNOSIS — Z7409 Other reduced mobility: Secondary | ICD-10-CM | POA: Diagnosis not present

## 2020-02-08 DIAGNOSIS — M6281 Muscle weakness (generalized): Secondary | ICD-10-CM | POA: Diagnosis not present

## 2020-02-15 DIAGNOSIS — I482 Chronic atrial fibrillation, unspecified: Secondary | ICD-10-CM | POA: Diagnosis not present

## 2020-02-19 ENCOUNTER — Other Ambulatory Visit: Payer: Self-pay | Admitting: Cardiology

## 2020-02-22 ENCOUNTER — Telehealth (INDEPENDENT_AMBULATORY_CARE_PROVIDER_SITE_OTHER): Payer: Medicare Other | Admitting: Cardiology

## 2020-02-22 ENCOUNTER — Encounter: Payer: Self-pay | Admitting: Cardiology

## 2020-02-22 VITALS — BP 108/68 | HR 64 | Temp 98.0°F | Resp 16 | Ht 66.0 in | Wt 128.0 lb

## 2020-02-22 DIAGNOSIS — I255 Ischemic cardiomyopathy: Secondary | ICD-10-CM

## 2020-02-22 DIAGNOSIS — I10 Essential (primary) hypertension: Secondary | ICD-10-CM

## 2020-02-22 DIAGNOSIS — Z7901 Long term (current) use of anticoagulants: Secondary | ICD-10-CM | POA: Diagnosis not present

## 2020-02-22 DIAGNOSIS — Z951 Presence of aortocoronary bypass graft: Secondary | ICD-10-CM | POA: Diagnosis not present

## 2020-02-22 DIAGNOSIS — I482 Chronic atrial fibrillation, unspecified: Secondary | ICD-10-CM

## 2020-02-22 NOTE — Patient Instructions (Signed)
Medication Instructions:  Your physician recommends that you continue on your current medications as directed. Please refer to the Current Medication list given to you today.  *If you need a refill on your cardiac medications before your next appointment, please call your pharmacy*   Follow-Up: At [facility], you and your health needs are our priority.  As part of our continuing mission to provide you with exceptional heart care, we have created designated Provider Care Teams.  These Care Teams include your primary Cardiologist (physician) and Advanced Practice Providers (APPs -  Physician Assistants and Nurse Practitioners) who all work together to provide you with the care you need, when you need it.  We recommend signing up for the patient portal called "MyChart".  Sign up information is provided on this After Visit Summary.  MyChart is used to connect with patients for Virtual Visits (Telemedicine).  Patients are able to view lab/test results, encounter notes, upcoming appointments, etc.  Non-urgent messages can be sent to your provider as well.   To learn more about what you can do with MyChart, go to [email or web address].    Your next appointment:   12 month(s)  The format for your next appointment:   Either In Person or Virtual  Provider:   You may see Kirk Ruths, MD or one of the following Advanced Practice Providers on your designated Care Team:    Kerin Ransom, PA-C  [place], [place]  Coletta Memos,     Other Instructions Please call our office 2 months in advance to schedule your follow-up appointment with Dr. Stanford Breed.

## 2020-02-22 NOTE — Progress Notes (Signed)
Virtual Visit via Telephone Note   This visit type was conducted due to national recommendations for restrictions regarding the COVID-19 Pandemic (e.g. social distancing) in an effort to limit this patient's exposure and mitigate transmission in our community.  Due to his co-morbid illnesses, this patient is at least at moderate risk for complications without adequate follow up.  This format is felt to be most appropriate for this patient at this time.  The patient did not have access to video technology/had technical difficulties with video requiring transitioning to audio format only (telephone).  All issues noted in this document were discussed and addressed.  No physical exam could be performed with this format.  Please refer to the patient's chart for his  consent to telehealth for [facility].   The patient was identified using 2 identifiers.  Date:  02/22/2020   ID:  [name], DOB Dec 21, 1917, MRN [id number]  Patient Location: Home Provider Location: Home  PCP:  Brent Manes, MD  Cardiologist:  Dr [name] Electrophysiologist:  None   Evaluation Performed:  Follow-Up Visit  Chief Complaint:  none  History of Present Illness:    [name] is a 86 y.o. male with a history of CABG x4 in January 2015.  Prior to that he had an ischemic cardiomyopathy, his ejection fraction had normalized by echocardiogram July 2015.  He has chronic atrial fibrillation and at the time of his CABG he had left atrial clipping.  He is on chronic warfarin therapy followed by his PCP.  In March 2020 he had transient LFT elevations that resolved, I believe it was suspected he had passed a stone.  Other medical issues include hypertension which is controlled.  The patient was contacted today for a 1 year follow-up.  He sounds pretty remarkable for 86 years old.  He says he has had no issues.  He is tolerating his medications well and has no complaints.  There was a home health RN present who also  participated in the call.  The patient does not have symptoms concerning for COVID-19 infection (fever, chills, cough, or new shortness of breath).    Past Medical History:  Diagnosis Date  . Atrial fibrillation ([place])    with rapid ventricular repsonse  . Bone lesion    lytic  . Debility   . HTN (hypertension)   . S/P CABG x 4 with atrial clip 10/21/2013  . Small bowel obstruction [facility])    Past Surgical History:  Procedure Laterality Date  . APPENDECTOMY    . CLIPPING OF ATRIAL APPENDAGE  10/21/2013   Procedure: CLIPPING OF ATRIAL APPENDAGE;  Surgeon: Grace Isaac, MD;  Location: [place];  Service: Open Heart Surgery;;  . CORONARY ARTERY BYPASS GRAFT N/A 10/21/2013   Procedure: CORONARY ARTERY BYPASS GRAFTING (CABG);  Surgeon: Grace Isaac, MD;  Location: [place];  Service: Open Heart Surgery;  Laterality: N/A;  . HERNIA REPAIR     x2  . INTRAOPERATIVE TRANSESOPHAGEAL ECHOCARDIOGRAM N/A 10/21/2013   Procedure: INTRAOPERATIVE TRANSESOPHAGEAL ECHOCARDIOGRAM;  Surgeon: Grace Isaac, MD;  Location: [place];  Service: Open Heart Surgery;  Laterality: N/A;  . LEFT HEART CATHETERIZATION WITH CORONARY ANGIOGRAM N/A 10/20/2013   Procedure: LEFT HEART CATHETERIZATION WITH CORONARY ANGIOGRAM;  Surgeon: Troy Sine, MD;  Location: [facility] CATH LAB;  Service: Cardiovascular;  Laterality: N/A;  . PARATHYROIDECTOMY       Current Meds  Medication Sig  . diltiazem (CARDIZEM CD) 240 MG 24 hr capsule Take 240  mg by mouth daily.  . furosemide (LASIX) 40 MG tablet TAKE 1 TABLET(40 MG) BY MOUTH TWICE DAILY  . losartan (COZAAR) 25 MG tablet TAKE 1 TABLET BY MOUTH EVERY DAY  . metoprolol succinate (TOPROL-XL) 50 MG 24 hr tablet Take 1 tablet (50 mg total) by mouth daily. Take with or immediately following a meal.  . Multiple Vitamin (MULTIVITAMIN) tablet Take 1 tablet by mouth daily.   . potassium chloride SA (KLOR-CON) 20 MEQ tablet TAKE 1 TABLET BY MOUTH DAILY( SCHEDULE ANNUAL APPT WITH DOCTOR CRENSHAW  FOR REFILLS. [phone])  . warfarin (COUMADIN) 2 MG tablet Take 3 mg by mouth daily. OR AS DIRECTED BY COUMADIN CLINIC 4 mg 2 days then 3 mg 1 day alternating     Allergies:   Norvasc [amlodipine besylate], Symmetrel [amantadine], and Hydrochlorothiazide   Social History   Tobacco Use  . Smoking status: Former Research scientist (life sciences)  . Smokeless tobacco: Never Used  . Tobacco comment: During WWII  Substance Use Topics  . Alcohol use: Yes    Alcohol/week: 7.0 standard drinks    Types: 7 Glasses of wine per week  . Drug use: No     Family Hx: The patient's family history is not on file.  ROS:   Please see the history of present illness.    All other systems reviewed and are negative.   Prior CV studies:   The following studies were reviewed today:  Echo July 2015  Labs/Other Tests and Data Reviewed:    EKG:  An ECG dated 01/10/2019 was personally reviewed today and demonstrated:  AF with VR 88, LVH, incomplete BBB  Recent Labs: No results found for requested labs within last 8760 hours.   Recent Lipid Panel No results found for: CHOL, TRIG, HDL, CHOLHDL, LDLCALC, LDLDIRECT  Wt Readings from Last 3 Encounters:  02/22/20 128 lb (58.1 kg)  01/29/19 144 lb (65.3 kg)  01/29/19 144 lb (65.3 kg)     Objective:    Vital Signs:  BP 108/68   Pulse 64   Temp 98 F (36.7 C)   Resp 16   Ht 5\' 6"  (1.676 m)   Wt 128 lb (58.1 kg)   SpO2 94%   BMI 20.66 kg/m    VITAL SIGNS:  reviewed  ASSESSMENT & PLAN:    H/O CABG- CABG x 18 Oct 2013 with LA clip- no symptoms of angina  ICM- EF normalized by echo July 2015  CAF- Asymptomatic, rate controlled.  Anticoagulated- Pt opted for warfarin Rx- followed by PCP  HTN- Controlled  Plan- Same Rx- f/u Dr [name] in one year  COVID-19 Education: The signs and symptoms of COVID-19 were discussed with the patient and how to seek care for testing (follow up with PCP or arrange E-visit).  The importance of social distancing was  discussed today.  Time:   Today, I have spent 15 minutes with the patient with telehealth technology discussing the above problems.     Medication Adjustments/Labs and Tests Ordered: Current medicines are reviewed at length with the patient today.  Concerns regarding medicines are outlined above.   Tests Ordered: No orders of the defined types were placed in this encounter.   Medication Changes: No orders of the defined types were placed in this encounter.   Follow Up:  Either In Person or Virtual Dr [name] in one year  Signed, Kerin Ransom, Hershal Coria  02/22/2020 10:34 AM    [place]

## 2020-02-29 DIAGNOSIS — I4891 Unspecified atrial fibrillation: Secondary | ICD-10-CM | POA: Diagnosis not present

## 2020-02-29 DIAGNOSIS — Z79899 Other long term (current) drug therapy: Secondary | ICD-10-CM | POA: Diagnosis not present

## 2020-03-15 DIAGNOSIS — I4891 Unspecified atrial fibrillation: Secondary | ICD-10-CM | POA: Diagnosis not present

## 2020-03-15 DIAGNOSIS — Z79899 Other long term (current) drug therapy: Secondary | ICD-10-CM | POA: Diagnosis not present

## 2020-03-22 DIAGNOSIS — I4891 Unspecified atrial fibrillation: Secondary | ICD-10-CM | POA: Diagnosis not present

## 2020-03-22 DIAGNOSIS — Z79899 Other long term (current) drug therapy: Secondary | ICD-10-CM | POA: Diagnosis not present

## 2020-03-23 ENCOUNTER — Other Ambulatory Visit: Payer: Self-pay | Admitting: Cardiology

## 2020-03-23 MED ORDER — METOPROLOL SUCCINATE ER 50 MG PO TB24: 50.0000 mg | ORAL_TABLET | Freq: Every day | ORAL | 3 refills | Status: DC

## 2020-03-23 NOTE — Telephone Encounter (Signed)
*  STAT* If patient is at the pharmacy, call can be transferred to refill team.   1. Which medications need to be refilled? (please list name of each medication and dose if known) metoprolol succinate (TOPROL-XL) 50 MG 24 hr tablet  2. Which pharmacy/location (including street and city if local pharmacy) is medication to be sent to? [facility] [place], [place] - [address] AT [place]  3. Do they need a 30 day or 90 day supply? 90 day   Patient is out of medication

## 2020-03-24 DIAGNOSIS — I482 Chronic atrial fibrillation, unspecified: Secondary | ICD-10-CM | POA: Diagnosis not present

## 2020-03-24 DIAGNOSIS — I10 Essential (primary) hypertension: Secondary | ICD-10-CM | POA: Diagnosis not present

## 2020-03-24 DIAGNOSIS — D6869 Other thrombophilia: Secondary | ICD-10-CM | POA: Diagnosis not present

## 2020-03-24 DIAGNOSIS — D692 Other nonthrombocytopenic purpura: Secondary | ICD-10-CM | POA: Diagnosis not present

## 2020-03-29 DIAGNOSIS — I482 Chronic atrial fibrillation, unspecified: Secondary | ICD-10-CM | POA: Diagnosis not present

## 2020-04-01 DIAGNOSIS — I482 Chronic atrial fibrillation, unspecified: Secondary | ICD-10-CM | POA: Diagnosis not present

## 2020-04-15 DIAGNOSIS — D649 Anemia, unspecified: Secondary | ICD-10-CM | POA: Diagnosis not present

## 2020-04-27 DIAGNOSIS — Z85828 Personal history of other malignant neoplasm of skin: Secondary | ICD-10-CM | POA: Diagnosis not present

## 2020-04-27 DIAGNOSIS — L821 Other seborrheic keratosis: Secondary | ICD-10-CM | POA: Diagnosis not present

## 2020-04-27 DIAGNOSIS — C44319 Basal cell carcinoma of skin of other parts of face: Secondary | ICD-10-CM | POA: Diagnosis not present

## 2020-04-27 DIAGNOSIS — L570 Actinic keratosis: Secondary | ICD-10-CM | POA: Diagnosis not present

## 2020-05-05 DIAGNOSIS — D649 Anemia, unspecified: Secondary | ICD-10-CM | POA: Diagnosis not present

## 2020-05-06 DIAGNOSIS — D649 Anemia, unspecified: Secondary | ICD-10-CM | POA: Diagnosis not present

## 2020-05-13 ENCOUNTER — Other Ambulatory Visit (HOSPITAL_COMMUNITY): Payer: Self-pay | Admitting: Nurse Practitioner

## 2020-05-13 ENCOUNTER — Telehealth (HOSPITAL_COMMUNITY): Payer: Self-pay | Admitting: Nurse Practitioner

## 2020-05-13 DIAGNOSIS — U071 COVID-19: Secondary | ICD-10-CM

## 2020-05-13 MED ORDER — SODIUM CHLORIDE 0.9 % IV SOLN
Freq: Once | INTRAVENOUS | Status: DC
  Filled 2020-05-13: qty 5

## 2020-05-13 NOTE — Telephone Encounter (Signed)
Called to Discuss with patient about Covid symptoms and the use of a monoclonal antibody infusion for those with mild to moderate Covid symptoms and at a high risk of hospitalization.     Pt is qualified for this infusion at the [facility] infusion center due to co-morbid conditions and/or a member of an at-risk group.     Symptom onset 7/26. Spoke to Liz Claiborne at [employer]. Patient is poor historian. She recommended I speak to his son, Yvone Neu to discuss MAB. I've left message for him. Per Anola Gurney, transportation not available from [place] until Monday after 9am. Anola Gurney can be reached at [phone]  Beckey Rutter, [place], AGNP-C [phone] ([place])

## 2020-05-13 NOTE — Progress Notes (Signed)
I connected by phone with Burman Foster son, Yvone Neu, on behalf of patient on 05/13/2020 at 4:34 PM to discuss the potential use of an new treatment for mild to moderate COVID-19 viral infection in non-hospitalized patients. Patient's son is POA.   This patient is a 86 y.o. male that meets the FDA criteria for Emergency Use Authorization of  casirivimab\imdevimab.  Has a (+) direct SARS-CoV-2 viral test result  Has mild or moderate COVID-19  Is ? 84 years of age and weighs ? 40 kg  Is NOT hospitalized due to COVID-19  Is NOT requiring oxygen therapy or requiring an increase in baseline oxygen flow rate due to COVID-19  Is within 10 days of symptom onset  Has at least one of the high risk factor(s) for progression to severe COVID-19 and/or hospitalization as defined in EUA.  Specific high risk criteria : Older age (>/= 84 yo)  I have spoken and communicated the following to the patient or parent/caregiver:  1. FDA has authorized the emergency use of casirivimab\imdevimab for the treatment of mild to moderate COVID-19 in adults and pediatric patients with positive results of direct SARS-CoV-2 viral testing who are 61 years of age and older weighing at least 40 kg, and who are at high risk for progressing to severe COVID-19 and/or hospitalization.  2. The significant known and potential risks and benefits of casirivimab\imdevimab, and the extent to which such potential risks and benefits are unknown.  3. Information on available alternative treatments and the risks and benefits of those alternatives, including clinical trials.  4. Patients treated with bamlanivimab and casirivimab\imdevimab should continue to self-isolate and use infection control measures (e.g., wear mask, isolate, social distance, avoid sharing personal items, clean and disinfect "high touch" surfaces, and frequent handwashing) according to CDC guidelines.   5. The patient or parent/caregiver has the option to accept or  refuse casirivimab\imdevimab .  After reviewing this information with the patient, The patient agreed to proceed with receiving casirivimab\imdevimab infusion and will be provided a copy of the Fact sheet prior to receiving the infusion.Beckey Rutter, [place], AGNP-C [phone] ([place])

## 2020-05-14 ENCOUNTER — Ambulatory Visit (HOSPITAL_COMMUNITY)
Admission: RE | Admit: 2020-05-14 | Discharge: 2020-05-14 | Disposition: A | Discharge status: Home / Self Care | Payer: Medicare Other | Source: Ambulatory Visit | Attending: Pulmonary Disease | Admitting: Pulmonary Disease

## 2020-05-14 DIAGNOSIS — U071 COVID-19: Secondary | ICD-10-CM

## 2020-05-15 ENCOUNTER — Inpatient Hospital Stay (HOSPITAL_COMMUNITY): Payer: Medicare Other

## 2020-05-15 ENCOUNTER — Encounter (HOSPITAL_COMMUNITY): Payer: Self-pay

## 2020-05-15 ENCOUNTER — Inpatient Hospital Stay (HOSPITAL_COMMUNITY)
Admission: EM | Admit: 2020-05-15 | Discharge: 2020-05-20 | DRG: 871 | Disposition: A | Discharge status: Skilled Nursing Facility | Payer: Medicare Other | Attending: Internal Medicine | Admitting: Internal Medicine

## 2020-05-15 ENCOUNTER — Other Ambulatory Visit: Payer: Self-pay

## 2020-05-15 ENCOUNTER — Emergency Department (HOSPITAL_COMMUNITY): Payer: Medicare Other

## 2020-05-15 DIAGNOSIS — M2550 Pain in unspecified joint: Secondary | ICD-10-CM | POA: Diagnosis not present

## 2020-05-15 DIAGNOSIS — R4182 Altered mental status, unspecified: Secondary | ICD-10-CM | POA: Diagnosis not present

## 2020-05-15 DIAGNOSIS — I10 Essential (primary) hypertension: Secondary | ICD-10-CM | POA: Diagnosis present

## 2020-05-15 DIAGNOSIS — I129 Hypertensive chronic kidney disease with stage 1 through stage 4 chronic kidney disease, or unspecified chronic kidney disease: Secondary | ICD-10-CM | POA: Diagnosis present

## 2020-05-15 DIAGNOSIS — Z951 Presence of aortocoronary bypass graft: Secondary | ICD-10-CM

## 2020-05-15 DIAGNOSIS — I517 Cardiomegaly: Secondary | ICD-10-CM | POA: Diagnosis not present

## 2020-05-15 DIAGNOSIS — I2510 Atherosclerotic heart disease of native coronary artery without angina pectoris: Secondary | ICD-10-CM | POA: Diagnosis present

## 2020-05-15 DIAGNOSIS — L8990 Pressure ulcer of unspecified site, unspecified stage: Secondary | ICD-10-CM | POA: Insufficient documentation

## 2020-05-15 DIAGNOSIS — S065X0D Traumatic subdural hemorrhage without loss of consciousness, subsequent encounter: Secondary | ICD-10-CM | POA: Diagnosis not present

## 2020-05-15 DIAGNOSIS — L89151 Pressure ulcer of sacral region, stage 1: Secondary | ICD-10-CM | POA: Diagnosis not present

## 2020-05-15 DIAGNOSIS — A4151 Sepsis due to Escherichia coli [E. coli]: Secondary | ICD-10-CM | POA: Diagnosis not present

## 2020-05-15 DIAGNOSIS — M6281 Muscle weakness (generalized): Secondary | ICD-10-CM | POA: Diagnosis not present

## 2020-05-15 DIAGNOSIS — Z7901 Long term (current) use of anticoagulants: Secondary | ICD-10-CM

## 2020-05-15 DIAGNOSIS — K7200 Acute and subacute hepatic failure without coma: Secondary | ICD-10-CM | POA: Diagnosis not present

## 2020-05-15 DIAGNOSIS — R0689 Other abnormalities of breathing: Secondary | ICD-10-CM | POA: Diagnosis not present

## 2020-05-15 DIAGNOSIS — Z79899 Other long term (current) drug therapy: Secondary | ICD-10-CM

## 2020-05-15 DIAGNOSIS — N171 Acute kidney failure with acute cortical necrosis: Secondary | ICD-10-CM | POA: Diagnosis not present

## 2020-05-15 DIAGNOSIS — S065XAA Traumatic subdural hemorrhage with loss of consciousness status unknown, initial encounter: Secondary | ICD-10-CM

## 2020-05-15 DIAGNOSIS — U071 COVID-19: Secondary | ICD-10-CM

## 2020-05-15 DIAGNOSIS — N390 Urinary tract infection, site not specified: Secondary | ICD-10-CM | POA: Diagnosis present

## 2020-05-15 DIAGNOSIS — E872 Acidosis, unspecified: Secondary | ICD-10-CM

## 2020-05-15 DIAGNOSIS — R6520 Severe sepsis without septic shock: Secondary | ICD-10-CM | POA: Diagnosis present

## 2020-05-15 DIAGNOSIS — R1312 Dysphagia, oropharyngeal phase: Secondary | ICD-10-CM | POA: Diagnosis not present

## 2020-05-15 DIAGNOSIS — N179 Acute kidney failure, unspecified: Secondary | ICD-10-CM | POA: Diagnosis not present

## 2020-05-15 DIAGNOSIS — E890 Postprocedural hypothyroidism: Secondary | ICD-10-CM | POA: Diagnosis not present

## 2020-05-15 DIAGNOSIS — R404 Transient alteration of awareness: Secondary | ICD-10-CM | POA: Diagnosis not present

## 2020-05-15 DIAGNOSIS — I4891 Unspecified atrial fibrillation: Secondary | ICD-10-CM

## 2020-05-15 DIAGNOSIS — R2689 Other abnormalities of gait and mobility: Secondary | ICD-10-CM | POA: Diagnosis not present

## 2020-05-15 DIAGNOSIS — R7989 Other specified abnormal findings of blood chemistry: Secondary | ICD-10-CM | POA: Diagnosis not present

## 2020-05-15 DIAGNOSIS — I6201 Nontraumatic acute subdural hemorrhage: Secondary | ICD-10-CM | POA: Diagnosis not present

## 2020-05-15 DIAGNOSIS — N1831 Chronic kidney disease, stage 3a: Secondary | ICD-10-CM | POA: Diagnosis present

## 2020-05-15 DIAGNOSIS — Z7401 Bed confinement status: Secondary | ICD-10-CM | POA: Diagnosis not present

## 2020-05-15 DIAGNOSIS — I452 Bifascicular block: Secondary | ICD-10-CM | POA: Diagnosis not present

## 2020-05-15 DIAGNOSIS — I2583 Coronary atherosclerosis due to lipid rich plaque: Secondary | ICD-10-CM | POA: Diagnosis not present

## 2020-05-15 DIAGNOSIS — G9341 Metabolic encephalopathy: Secondary | ICD-10-CM | POA: Diagnosis not present

## 2020-05-15 DIAGNOSIS — R5381 Other malaise: Secondary | ICD-10-CM | POA: Diagnosis not present

## 2020-05-15 DIAGNOSIS — J90 Pleural effusion, not elsewhere classified: Secondary | ICD-10-CM | POA: Diagnosis not present

## 2020-05-15 DIAGNOSIS — R41841 Cognitive communication deficit: Secondary | ICD-10-CM | POA: Diagnosis not present

## 2020-05-15 DIAGNOSIS — F1010 Alcohol abuse, uncomplicated: Secondary | ICD-10-CM | POA: Diagnosis present

## 2020-05-15 DIAGNOSIS — R410 Disorientation, unspecified: Secondary | ICD-10-CM

## 2020-05-15 DIAGNOSIS — Z66 Do not resuscitate: Secondary | ICD-10-CM | POA: Diagnosis present

## 2020-05-15 DIAGNOSIS — I252 Old myocardial infarction: Secondary | ICD-10-CM | POA: Diagnosis not present

## 2020-05-15 DIAGNOSIS — Z8709 Personal history of other diseases of the respiratory system: Secondary | ICD-10-CM

## 2020-05-15 DIAGNOSIS — I482 Chronic atrial fibrillation, unspecified: Secondary | ICD-10-CM | POA: Diagnosis present

## 2020-05-15 DIAGNOSIS — R2681 Unsteadiness on feet: Secondary | ICD-10-CM | POA: Diagnosis not present

## 2020-05-15 DIAGNOSIS — S065X9A Traumatic subdural hemorrhage with loss of consciousness of unspecified duration, initial encounter: Secondary | ICD-10-CM

## 2020-05-15 DIAGNOSIS — Z888 Allergy status to other drugs, medicaments and biological substances status: Secondary | ICD-10-CM

## 2020-05-15 DIAGNOSIS — E876 Hypokalemia: Secondary | ICD-10-CM | POA: Diagnosis not present

## 2020-05-15 DIAGNOSIS — R278 Other lack of coordination: Secondary | ICD-10-CM | POA: Diagnosis not present

## 2020-05-15 LAB — TROPONIN I (HIGH SENSITIVITY)
Troponin I (High Sensitivity): 248 ng/L (ref <18)
Troponin I (High Sensitivity): 222 ng/L (ref <18)

## 2020-05-15 LAB — CBC WITH DIFFERENTIAL/PLATELET
Abs Immature Granulocytes: 1.58 10*3/uL — ABNORMAL HIGH (ref 0.00–0.07)
Basophils Absolute: 0 10*3/uL (ref 0.0–0.1)
Basophils Relative: 0 %
Eosinophils Absolute: 0 10*3/uL (ref 0.0–0.5)
Eosinophils Relative: 0 %
HCT: 41.7 % (ref 39.0–52.0)
Hemoglobin: 13.6 g/dL (ref 13.0–17.0)
Immature Granulocytes: 9 %
Lymphocytes Relative: 7 %
Lymphs Abs: 1.3 10*3/uL (ref 0.7–4.0)
MCH: 30.6 pg (ref 26.0–34.0)
MCHC: 32.6 g/dL (ref 30.0–36.0)
MCV: 93.9 fL (ref 80.0–100.0)
Monocytes Absolute: 0.5 10*3/uL (ref 0.1–1.0)
Monocytes Relative: 3 %
Neutro Abs: 15.3 10*3/uL — ABNORMAL HIGH (ref 1.7–7.7)
Neutrophils Relative %: 81 %
Platelets: 122 10*3/uL — ABNORMAL LOW (ref 150–400)
RBC: 4.44 MIL/uL (ref 4.22–5.81)
RDW: 14.1 % (ref 11.5–15.5)
WBC: 18.6 10*3/uL — ABNORMAL HIGH (ref 4.0–10.5)
nRBC: 0 % (ref 0.0–0.2)

## 2020-05-15 LAB — COMPREHENSIVE METABOLIC PANEL
ALT: 221 U/L — ABNORMAL HIGH (ref 0–44)
AST: 370 U/L — ABNORMAL HIGH (ref 15–41)
Albumin: 3 g/dL — ABNORMAL LOW (ref 3.5–5.0)
Alkaline Phosphatase: 211 U/L — ABNORMAL HIGH (ref 38–126)
Anion gap: 14 (ref 5–15)
BUN: 42 mg/dL — ABNORMAL HIGH (ref 8–23)
CO2: 22 mmol/L (ref 22–32)
Calcium: 8.1 mg/dL — ABNORMAL LOW (ref 8.9–10.3)
Chloride: 101 mmol/L (ref 98–111)
Creatinine, Ser: 2.28 mg/dL — ABNORMAL HIGH (ref 0.61–1.24)
GFR calc Af Amer: 26 mL/min — ABNORMAL LOW (ref >60)
GFR calc non Af Amer: 23 mL/min — ABNORMAL LOW (ref >60)
Glucose, Bld: 138 mg/dL — ABNORMAL HIGH (ref 70–99)
Potassium: 4.2 mmol/L (ref 3.5–5.1)
Sodium: 137 mmol/L (ref 135–145)
Total Bilirubin: 3.5 mg/dL — ABNORMAL HIGH (ref 0.3–1.2)
Total Protein: 6.2 g/dL — ABNORMAL LOW (ref 6.5–8.1)

## 2020-05-15 LAB — URINALYSIS, ROUTINE W REFLEX MICROSCOPIC
Bilirubin Urine: NEGATIVE
Glucose, UA: NEGATIVE mg/dL
Ketones, ur: 5 mg/dL — AB
Leukocytes,Ua: NEGATIVE
Nitrite: NEGATIVE
Protein, ur: 100 mg/dL — AB
Specific Gravity, Urine: 1.016 (ref 1.005–1.030)
pH: 5 (ref 5.0–8.0)

## 2020-05-15 LAB — LACTIC ACID, PLASMA
Lactic Acid, Venous: 2.5 mmol/L (ref 0.5–1.9)
Lactic Acid, Venous: 2 mmol/L (ref 0.5–1.9)

## 2020-05-15 LAB — SARS CORONAVIRUS 2 BY RT PCR (HOSPITAL ORDER, PERFORMED IN ~~LOC~~ HOSPITAL LAB): SARS Coronavirus 2: POSITIVE — AB

## 2020-05-15 LAB — CK: Total CK: 150 U/L (ref 49–397)

## 2020-05-15 LAB — PROTIME-INR
INR: 1.5 — ABNORMAL HIGH (ref 0.8–1.2)
Prothrombin Time: 17.8 seconds — ABNORMAL HIGH (ref 11.4–15.2)

## 2020-05-15 MED ORDER — WARFARIN SODIUM 3 MG PO TABS: 3.0000 mg | ORAL_TABLET | Freq: Every day | ORAL | Status: DC

## 2020-05-15 MED ORDER — DILTIAZEM HCL 25 MG/5ML IV SOLN
10.0000 mg | Freq: Once | INTRAVENOUS | Status: AC
  Administered 2020-05-15: 10 mg via INTRAVENOUS
  Filled 2020-05-15: qty 5

## 2020-05-15 MED ORDER — IPRATROPIUM BROMIDE HFA 17 MCG/ACT IN AERS
2.0000 | INHALATION_SPRAY | Freq: Four times a day (QID) | RESPIRATORY_TRACT | Status: DC
  Administered 2020-05-15 – 2020-05-20 (×16): 2 via RESPIRATORY_TRACT
  Filled 2020-05-15 (×2): qty 12.9

## 2020-05-15 MED ORDER — THIAMINE HCL 100 MG PO TABS
100.0000 mg | ORAL_TABLET | Freq: Every day | ORAL | Status: DC
  Administered 2020-05-17 – 2020-05-20 (×4): 100 mg via ORAL
  Filled 2020-05-15 (×6): qty 1

## 2020-05-15 MED ORDER — SODIUM CHLORIDE 0.9 % IV SOLN
1.0000 g | Freq: Once | INTRAVENOUS | Status: AC
  Administered 2020-05-15: 1 g via INTRAVENOUS
  Filled 2020-05-15: qty 10

## 2020-05-15 MED ORDER — LACTATED RINGERS IV BOLUS
500.0000 mL | Freq: Once | INTRAVENOUS | Status: AC
  Administered 2020-05-15: 500 mL via INTRAVENOUS

## 2020-05-15 MED ORDER — ONDANSETRON HCL 4 MG PO TABS: 4.0000 mg | ORAL_TABLET | Freq: Four times a day (QID) | ORAL | Status: DC | PRN

## 2020-05-15 MED ORDER — SODIUM CHLORIDE 0.9 % IV SOLN: INTRAVENOUS | Status: AC

## 2020-05-15 MED ORDER — DEXAMETHASONE 6 MG PO TABS
6.0000 mg | ORAL_TABLET | ORAL | Status: DC
  Filled 2020-05-15: qty 2

## 2020-05-15 MED ORDER — GUAIFENESIN-DM 100-10 MG/5ML PO SYRP: 10.0000 mL | ORAL_SOLUTION | ORAL | Status: DC | PRN

## 2020-05-15 MED ORDER — APIXABAN 2.5 MG PO TABS
2.5000 mg | ORAL_TABLET | Freq: Two times a day (BID) | ORAL | Status: DC
  Filled 2020-05-15: qty 1

## 2020-05-15 MED ORDER — FOLIC ACID 1 MG PO TABS
1.0000 mg | ORAL_TABLET | Freq: Every day | ORAL | Status: DC
  Administered 2020-05-17 – 2020-05-20 (×4): 1 mg via ORAL
  Filled 2020-05-15 (×6): qty 1

## 2020-05-15 MED ORDER — DOXYCYCLINE HYCLATE 100 MG PO TABS: 100.0000 mg | ORAL_TABLET | Freq: Two times a day (BID) | ORAL | Status: DC

## 2020-05-15 MED ORDER — SODIUM CHLORIDE 0.9 % IV SOLN
200.0000 mg | Freq: Once | INTRAVENOUS | Status: AC
  Administered 2020-05-15: 200 mg via INTRAVENOUS
  Filled 2020-05-15: qty 40

## 2020-05-15 MED ORDER — SODIUM CHLORIDE 0.9 % IV SOLN
100.0000 mg | Freq: Every day | INTRAVENOUS | Status: AC
  Administered 2020-05-16 – 2020-05-19 (×4): 100 mg via INTRAVENOUS
  Filled 2020-05-15 (×5): qty 20

## 2020-05-15 MED ORDER — ADULT MULTIVITAMIN W/MINERALS CH: 1.0000 | ORAL_TABLET | Freq: Every day | ORAL | Status: DC

## 2020-05-15 MED ORDER — ADULT MULTIVITAMIN W/MINERALS CH
1.0000 | ORAL_TABLET | Freq: Every day | ORAL | Status: DC
  Administered 2020-05-17 – 2020-05-20 (×4): 1 via ORAL
  Filled 2020-05-15 (×6): qty 1

## 2020-05-15 MED ORDER — ACETAMINOPHEN 325 MG PO TABS
650.0000 mg | ORAL_TABLET | Freq: Four times a day (QID) | ORAL | Status: DC | PRN
  Administered 2020-05-19: 650 mg via ORAL
  Filled 2020-05-15: qty 2

## 2020-05-15 MED ORDER — LORAZEPAM 2 MG/ML IJ SOLN: 1.0000 mg | INTRAMUSCULAR | Status: DC | PRN

## 2020-05-15 MED ORDER — SODIUM CHLORIDE 0.9 % IV BOLUS
500.0000 mL | Freq: Once | INTRAVENOUS | Status: AC
  Administered 2020-05-15: 500 mL via INTRAVENOUS

## 2020-05-15 MED ORDER — DILTIAZEM HCL-DEXTROSE 125-5 MG/125ML-% IV SOLN (PREMIX)
5.0000 mg/h | INTRAVENOUS | Status: DC
  Administered 2020-05-15: 5 mg/h via INTRAVENOUS
  Administered 2020-05-16 (×2): 12.5 mg/h via INTRAVENOUS
  Filled 2020-05-15 (×5): qty 125

## 2020-05-15 MED ORDER — LORAZEPAM 1 MG PO TABS: 1.0000 mg | ORAL_TABLET | ORAL | Status: DC | PRN

## 2020-05-15 MED ORDER — THIAMINE HCL 100 MG/ML IJ SOLN
100.0000 mg | Freq: Every day | INTRAMUSCULAR | Status: DC
  Administered 2020-05-16: 100 mg via INTRAVENOUS
  Filled 2020-05-15: qty 2

## 2020-05-15 MED ORDER — ONDANSETRON HCL 4 MG/2ML IJ SOLN: 4.0000 mg | Freq: Four times a day (QID) | INTRAMUSCULAR | Status: DC | PRN

## 2020-05-15 MED ORDER — METOPROLOL TARTRATE 25 MG PO TABS: 25.0000 mg | ORAL_TABLET | Freq: Two times a day (BID) | ORAL | Status: DC

## 2020-05-15 MED ORDER — AEROCHAMBER PLUS FLO-VU LARGE MISC
Status: AC
  Administered 2020-05-15: 1
  Filled 2020-05-15: qty 1

## 2020-05-15 NOTE — H&P (Signed)
History and Physical    Youssef Footman Varone KXF:[id number] DOB: Jul 27, 1918 DOA: 05/15/2020  PCP: Lajean Manes, MD (Confirm with patient/family/NH records and if not entered, this has to be entered at [facility] point of entry) Patient coming from: Assisted living  I have personally briefly reviewed patient's old medical records in [place]  Chief Complaint: Change of mentation  HPI: [name] is a 86 y.o. male with medical history significant of chronic A. fib, hypertension, CAD status post CABG x4, alcohol abuse, presented with altered mental status.  Patient lives in senior assisted living, received 2 doses of COVID-19 vaccination in March.  Facility reported that the patient has a girlfriend who recently was diagnosed with COVID-19, and patient went to tested on Friday turn positive.  As a PCPs recommendation patient received COVID-19 monoclonal antibody yesterday.  This morning front dest staff checked the patient on phone patient very confused with labored breathing and they called EMS. Pt however denies any fever chills, he denies have some dry cough but no short of breath.  Denies any loss of taste, no diarrhea. ED Course: Patient was found to be in rapid A. fib, O2 saturation initially was "done decreased to 90 to 91% on room air.  Chest x-ray shows similar chronic pleural calcifications changes as in March, WBC 2018, creatinine 2.2 compared to 1.0 in March of this year, and UA showed questionable UTI.  Review of Systems: As per HPI otherwise 10 point review of systems negative.    Past Medical History:  Diagnosis Date  . Atrial fibrillation ([place])    with rapid ventricular repsonse  . Bone lesion    lytic  . Debility   . HTN (hypertension)   . S/P CABG x 4 with atrial clip 10/21/2013  . Small bowel obstruction [facility])     Past Surgical History:  Procedure Laterality Date  . APPENDECTOMY    . CLIPPING OF ATRIAL APPENDAGE  10/21/2013   Procedure: CLIPPING OF ATRIAL APPENDAGE;   Surgeon: Grace Isaac, MD;  Location: [place];  Service: Open Heart Surgery;;  . CORONARY ARTERY BYPASS GRAFT N/A 10/21/2013   Procedure: CORONARY ARTERY BYPASS GRAFTING (CABG);  Surgeon: Grace Isaac, MD;  Location: [place];  Service: Open Heart Surgery;  Laterality: N/A;  . HERNIA REPAIR     x2  . INTRAOPERATIVE TRANSESOPHAGEAL ECHOCARDIOGRAM N/A 10/21/2013   Procedure: INTRAOPERATIVE TRANSESOPHAGEAL ECHOCARDIOGRAM;  Surgeon: Grace Isaac, MD;  Location: [place];  Service: Open Heart Surgery;  Laterality: N/A;  . LEFT HEART CATHETERIZATION WITH CORONARY ANGIOGRAM N/A 10/20/2013   Procedure: LEFT HEART CATHETERIZATION WITH CORONARY ANGIOGRAM;  Surgeon: Troy Sine, MD;  Location: [facility] C[facility];  Service: Cardiovascular;  Laterality: N/A;  . PARATHYROIDECTOMY       reports that he has quit smoking. He has never used smokeless tobacco. He reports current alcohol use of about 7.0 standard drinks of alcohol per week. He reports that he does not use drugs.  Allergies  Allergen Reactions  . Norvasc [Amlodipine Besylate] Other (See Comments)    Weakness   . Symmetrel [Amantadine] Cough  . Hydrochlorothiazide Palpitations    No family history on file.   Prior to Admission medications   Medication Sig Start Date End Date Taking? Authorizing Provider  diltiazem (CARDIZEM CD) 240 MG 24 hr capsule Take 240 mg by mouth daily.    [provider]  furosemide (LASIX) 40 MG tablet TAKE 1 TABLET(40 MG) BY MOUTH TWICE DAILY 04/27/19  Lelon Perla, MD  losartan (COZAAR) 25 MG tablet TAKE 1 TABLET BY MOUTH EVERY DAY 06/18/17   Lelon Perla, MD  metoprolol succinate (TOPROL-XL) 50 MG 24 hr tablet Take 1 tablet (50 mg total) by mouth daily. Take with or immediately following a meal. 03/23/20   Kilroy, Doreene Burke, PA-C  Multiple Vitamin (MULTIVITAMIN) tablet Take 1 tablet by mouth daily.     [provider]  potassium chloride SA (KLOR-CON) 20 MEQ tablet TAKE 1 TABLET BY MOUTH  DAILY( SCHEDULE ANNUAL APPT WITH DOCTOR CRENSHAW FOR REFILLS. [phone]) 02/19/20   Lelon Perla, MD  warfarin (COUMADIN) 2 MG tablet Take 3 mg by mouth daily. OR AS DIRECTED BY COUMADIN CLINIC 4 mg 2 days then 3 mg 1 day alternating    [provider]    Physical Exam: Vitals:   05/15/20 1800 05/15/20 1803 05/15/20 1811 05/15/20 1830  BP: (!) 141/89  (!) 144/92 118/65  Pulse: (!) 133  (!) 120 (!) 136  Resp:  (!) 25 (!) 26 23  Temp:      TempSrc:      SpO2: 95%  94% 93%    Constitutional: NAD, calm, comfortable Vitals:   05/15/20 1800 05/15/20 1803 05/15/20 1811 05/15/20 1830  BP: (!) 141/89  (!) 144/92 118/65  Pulse: (!) 133  (!) 120 (!) 136  Resp:  (!) 25 (!) 26 23  Temp:      TempSrc:      SpO2: 95%  94% 93%   Eyes: PERRL, lids and conjunctivae normal ENMT: Mucous membranes are dry. Posterior pharynx clear of any exudate or lesions.Normal dentition.  Neck: normal, supple, no masses, no thyromegaly Respiratory: clear to auscultation bilaterally, no wheezing, no crackles. Increased respiratory effort. Positive signs of using accessory muscle use.  Cardiovascular: Regular rate and rhythm, no murmurs / rubs / gallops. No extremity edema. 2+ pedal pulses. No carotid bruits.  Abdomen: no tenderness, no masses palpated. No hepatosplenomegaly. Bowel sounds positive.  Musculoskeletal: no clubbing / cyanosis. No joint deformity upper and lower extremities. Good ROM, no contractures. Normal muscle tone.  Skin: no rashes, lesions, ulcers. No induration Neurologic: CN 2-12 grossly intact. Sensation intact, DTR normal. Strength 5/5 in all 4.  Psychiatric: Normal judgment and insight.  Oriented to himself, confused about time and place.   Labs on Admission: I have personally reviewed following labs and imaging studies  CBC: Recent Labs  Lab 05/15/20 1533  WBC 18.6*  NEUTROABS 15.3*  HGB 13.6  HCT 41.7  MCV 93.9  PLT 962*   Basic Metabolic Panel: Recent Labs  Lab  05/15/20 1533  NA 137  K 4.2  CL 101  CO2 22  GLUCOSE 138*  BUN 42*  CREATININE 2.28*  CALCIUM 8.1*   GFR: CrCl cannot be calculated (Unknown ideal weight.). Liver Function Tests: Recent Labs  Lab 05/15/20 1533  AST 370*  ALT 221*  ALKPHOS 211*  BILITOT 3.5*  PROT 6.2*  ALBUMIN 3.0*   No results for input(s): LIPASE, AMYLASE in the last 168 hours. No results for input(s): AMMONIA in the last 168 hours. Coagulation Profile: Recent Labs  Lab 05/15/20 1533  INR 1.5*   Cardiac Enzymes: Recent Labs  Lab 05/15/20 1533  CKTOTAL 150   BNP (last 3 results) No results for input(s): PROBNP in the last 8760 hours. HbA1C: No results for input(s): HGBA1C in the last 72 hours. CBG: No results for input(s): GLUCAP in the last 168 hours. Lipid Profile: No results  for input(s): CHOL, HDL, LDLCALC, TRIG, CHOLHDL, LDLDIRECT in the last 72 hours. Thyroid Function Tests: No results for input(s): TSH, T4TOTAL, FREET4, T3FREE, THYROIDAB in the last 72 hours. Anemia Panel: No results for input(s): VITAMINB12, FOLATE, FERRITIN, TIBC, IRON, RETICCTPCT in the last 72 hours. Urine analysis:    Component Value Date/Time   COLORURINE AMBER (A) 05/15/2020 1657   APPEARANCEUR CLOUDY (A) 05/15/2020 1657   LABSPEC 1.016 05/15/2020 1657   PHURINE 5.0 05/15/2020 1657   GLUCOSEU NEGATIVE 05/15/2020 1657   HGBUR MODERATE (A) 05/15/2020 1657   BILIRUBINUR NEGATIVE 05/15/2020 1657   KETONESUR 5 (A) 05/15/2020 1657   PROTEINUR 100 (A) 05/15/2020 1657   UROBILINOGEN 1.0 10/17/2013 1533   NITRITE NEGATIVE 05/15/2020 1657   LEUKOCYTESUR NEGATIVE 05/15/2020 1657    Radiological Exams on Admission: DG Chest Portable 1 View  Result Date: 05/15/2020 CLINICAL DATA:  COVID-19 positive EXAM: PORTABLE CHEST 1 VIEW COMPARISON:  Radiograph 01/09/2019, CT 08/08/2015 FINDINGS: No new consolidative opacity is present however the extensive partially calcified pleural plaque seen on multiple prior  comparison imaging may obscure detection of underlying airspace disease. Chronic blunting of the left costophrenic sulcus is present compatible with chronic effusion seen on priors. Stable cardiomegaly with a calcified, tortuous aorta as well as features of prior sternotomy and CABG as well as placement of the left atrial occluder device. Dense coronary artery calcifications as well as coronary stenting is noted. No acute osseous or soft tissue abnormality. Telemetry leads overlie the chest. IMPRESSION: 1. No new consolidative opacity however the extensive partially calcified pleural plaque may obscure detection of underlying airspace disease. 2. Chronic left pleural effusion. 3. Stable cardiomegaly and postsurgical changes. Electronically Signed   By: Lovena Le M.D.   On: 05/15/2020 16:24    EKG: Independently reviewed.  Rapid A. fib  Assessment/Plan Active Problems:   COVID-19 virus infection   COVID-19  (please populate well all problems here in Problem List. (For example, if patient is on BP meds at home and you resume or decide to hold them, it is a problem that needs to be her. Same for CAD, COPD, HLD and so on)  COVID 22 -S/P COVID-19 vaccination x2 and monoclonal antibody infusions -Continue remdesivir and steroid -Breathing treatment  Sepsis -Probably combination of COVID-19 infection, but also suspect concurrent bacterial infection.  Potential source can be atypical pneumonia given no significant left chest x-ray pattern changes, also notes patient has history of cholelithiasis, and his urine suspicious for UTI, decided to cover with ceftriaxone plus doxycycline for all above potential sources. -Given the significant elevation of creatinine level compared to baseline and elevated lactic acid, will hold his BP meds and start IV fluids for 12 hours and evaluate.  Rapid A. Fib -Continue Cardizem drip for now -Consider increase beta-blocker dosage if Cardizem drip not  sufficient -Renal dose Eliquis  AKI  -Hydration and reevaluate, hold Lasix -Check renal ultrasound  Acute on chronic transaminitis and bilirubinemia -According to facility staff, patient is a chronic drinker with unknown amount -Will cover for cholecystitis and check RUQ ultrasound -Trend LFTs, given patient age and other conditions I do not feel he is a good candidate for aggressive work-up or treatment.  EtOH abuse -As of now, there is no symptoms signs of withdrawal symptoms, will cover with as needed benzos with CIWA protocol  Acute metabolic encephalopathy -Multifactorial from COVID infection and sepsis -Treat source and reevaluate  DVT prophylaxis: Eliquis Code Status: DNR Family Communication: Son over phone Disposition Plan:  Sick, likely will need at least 3 to 4 days Consults called: None Admission status: PCU   Lequita Halt MD [facility] Pager [phone]  05/15/2020, 6:54 PM

## 2020-05-15 NOTE — ED Provider Notes (Signed)
[facility] EMERGENCY DEPARTMENT Provider Note   CSN: [id number] Arrival date & time: 05/15/20  1519     History Chief Complaint  Patient presents with  . Altered Mental Status    [name] is a 86 y.o. male.   Altered Mental Status Presenting symptoms: disorientation   Severity:  Moderate Most recent episode:  Today Episode history:  Single Timing:  Constant Progression:  Unchanged Chronicity:  New Context comment:  Covid pos      Past Medical History:  Diagnosis Date  . Atrial fibrillation ([place])    with rapid ventricular repsonse  . Bone lesion    lytic  . Debility   . HTN (hypertension)   . S/P CABG x 4 with atrial clip 10/21/2013  . Small bowel obstruction [facility])     Patient Active Problem List   Diagnosis Date Noted  . Chest pain 01/10/2019  . Abdominal pain 01/10/2019  . Elevated LFTs 01/10/2019  . Abnormal CT of the chest 01/10/2019  . Swelling in head/neck   . CAD (coronary artery disease) 11/30/2013  . Chronic atrial fibrillation ([place]) 10/27/2013  . Chronic anticoagulation 10/27/2013  . Cardiomyopathy, ischemic- EF 35-40% echo 10/19/12 10/27/2013  . S/P CABG x 4 with atrial clip 10/21/2013  . Left main coronary artery disease 10/20/2013  . Hx of asbestosis 10/20/2013  . NSTEMI (non-ST elevated myocardial infarction) ([place]) 10/17/2013  . Essential hypertension, benign 08/21/2010  . Atrial fibrillation with rapid ventricular response on adm 10/17/13 08/21/2010    Past Surgical History:  Procedure Laterality Date  . APPENDECTOMY    . CLIPPING OF ATRIAL APPENDAGE  10/21/2013   Procedure: CLIPPING OF ATRIAL APPENDAGE;  Surgeon: Grace Isaac, MD;  Location: [place];  Service: Open Heart Surgery;;  . CORONARY ARTERY BYPASS GRAFT N/A 10/21/2013   Procedure: CORONARY ARTERY BYPASS GRAFTING (CABG);  Surgeon: Grace Isaac, MD;  Location: [place];  Service: Open Heart Surgery;  Laterality: N/A;  . HERNIA REPAIR     x2  .  INTRAOPERATIVE TRANSESOPHAGEAL ECHOCARDIOGRAM N/A 10/21/2013   Procedure: INTRAOPERATIVE TRANSESOPHAGEAL ECHOCARDIOGRAM;  Surgeon: Grace Isaac, MD;  Location: [place];  Service: Open Heart Surgery;  Laterality: N/A;  . LEFT HEART CATHETERIZATION WITH CORONARY ANGIOGRAM N/A 10/20/2013   Procedure: LEFT HEART CATHETERIZATION WITH CORONARY ANGIOGRAM;  Surgeon: Troy Sine, MD;  Location: [facility] CATH LAB;  Service: Cardiovascular;  Laterality: N/A;  . PARATHYROIDECTOMY         No family history on file.  Social History   Tobacco Use  . Smoking status: Former Research scientist (life sciences)  . Smokeless tobacco: Never Used  . Tobacco comment: During WWII  Vaping Use  . Vaping Use: Never used  Substance Use Topics  . Alcohol use: Yes    Alcohol/week: 7.0 standard drinks    Types: 7 Glasses of wine per week  . Drug use: No    Home Medications Prior to Admission medications   Medication Sig Start Date End Date Taking? Authorizing Provider  diltiazem (CARDIZEM CD) 240 MG 24 hr capsule Take 240 mg by mouth daily.    [provider]  furosemide (LASIX) 40 MG tablet TAKE 1 TABLET(40 MG) BY MOUTH TWICE DAILY 04/27/19   Lelon Perla, MD  losartan (COZAAR) 25 MG tablet TAKE 1 TABLET BY MOUTH EVERY DAY 06/18/17   Lelon Perla, MD  metoprolol succinate (TOPROL-XL) 50 MG 24 hr tablet Take 1 tablet (50 mg total) by mouth daily. Take with or immediately  following a meal. 03/23/20   Kilroy, Doreene Burke, PA-C  Multiple Vitamin (MULTIVITAMIN) tablet Take 1 tablet by mouth daily.     [provider]  potassium chloride SA (KLOR-CON) 20 MEQ tablet TAKE 1 TABLET BY MOUTH DAILY( SCHEDULE ANNUAL APPT WITH DOCTOR CRENSHAW FOR REFILLS. [phone]) 02/19/20   Lelon Perla, MD  warfarin (COUMADIN) 2 MG tablet Take 3 mg by mouth daily. OR AS DIRECTED BY COUMADIN CLINIC 4 mg 2 days then 3 mg 1 day alternating    [provider]    Allergies    Norvasc [amlodipine besylate], Symmetrel [amantadine], and  Hydrochlorothiazide  Review of Systems   Review of Systems  Unable to perform ROS: Acuity of condition  Family states the patient has been unable to give explanation of how he is feeling secondary to Covid.  Altered mental status has started with Covid diagnosis.  Physical Exam Updated Vital Signs BP (!) 144/92   Pulse (!) 120   Temp 99.2 F (37.3 C) (Rectal)   Resp (!) 26   SpO2 94%   Physical Exam Vitals and nursing note reviewed.  Constitutional:      General: He is not in acute distress.    Appearance: Normal appearance.  HENT:     Head: Normocephalic and atraumatic.     Nose: No rhinorrhea.  Eyes:     General:        Right eye: No discharge.        Left eye: No discharge.     Conjunctiva/sclera: Conjunctivae normal.  Cardiovascular:     Rate and Rhythm: Regular rhythm. Tachycardia present.  Pulmonary:     Effort: Pulmonary effort is normal. No respiratory distress.     Breath sounds: No stridor.  Abdominal:     General: Abdomen is flat. There is no distension.     Palpations: Abdomen is soft.     Tenderness: There is no abdominal tenderness.  Musculoskeletal:        General: No deformity or signs of injury.  Skin:    General: Skin is warm and dry.  Neurological:     General: No focal deficit present.     Mental Status: He is alert. He is disoriented.     Sensory: No sensory deficit.     Motor: No weakness.     ED Results / Procedures / Treatments   Labs (all labs ordered are listed, but only abnormal results are displayed) Labs Reviewed  SARS CORONAVIRUS 2 BY RT PCR (HOSPITAL ORDER, [facility]) - Abnormal; Notable for the following components:      Result Value   SARS Coronavirus 2 POSITIVE (*)    All other components within normal limits  COMPREHENSIVE METABOLIC PANEL - Abnormal; Notable for the following components:   Glucose, Bld 138 (*)    BUN 42 (*)    Creatinine, Ser 2.28 (*)    Calcium 8.1 (*)    Total Protein 6.2  (*)    Albumin 3.0 (*)    AST 370 (*)    ALT 221 (*)    Alkaline Phosphatase 211 (*)    Total Bilirubin 3.5 (*)    GFR calc non Af Amer 23 (*)    GFR calc Af Amer 26 (*)    All other components within normal limits  LACTIC ACID, PLASMA - Abnormal; Notable for the following components:   Lactic Acid, Venous 2.5 (*)    All other components within normal limits  CBC WITH DIFFERENTIAL/PLATELET - Abnormal; Notable for the following components:   WBC 18.6 (*)    Platelets 122 (*)    Neutro Abs 15.3 (*)    Abs Immature Granulocytes 1.58 (*)    All other components within normal limits  PROTIME-INR - Abnormal; Notable for the following components:   Prothrombin Time 17.8 (*)    INR 1.5 (*)    All other components within normal limits  URINALYSIS, ROUTINE W REFLEX MICROSCOPIC - Abnormal; Notable for the following components:   Color, Urine AMBER (*)    APPearance CLOUDY (*)    Hgb urine dipstick MODERATE (*)    Ketones, ur 5 (*)    Protein, ur 100 (*)    Bacteria, UA RARE (*)    All other components within normal limits  TROPONIN I (HIGH SENSITIVITY) - Abnormal; Notable for the following components:   Troponin I (High Sensitivity) 248 (*)    All other components within normal limits  CULTURE, BLOOD (ROUTINE X 2)  CULTURE, BLOOD (ROUTINE X 2)  CK  LACTIC ACID, PLASMA  TROPONIN I (HIGH SENSITIVITY)    EKG EKG Interpretation  Date/Time:  Sunday May 15 2020 15:26:28 EDT Ventricular Rate:  133 PR Interval:    QRS Duration: 112 QT Interval:  329 QTC Calculation: 490 R Axis:   -47 Text Interpretation: Atrial fibrillation Paired ventricular premature complexes Incomplete RBBB and LAFB Abnormal R-wave progression, late transition Repolarization abnormality, prob rate related Confirmed by Dewaine Conger [phone]) on 05/15/2020 3:41:15 PM   Radiology DG Chest Portable 1 View  Result Date: 05/15/2020 CLINICAL DATA:  COVID-19 positive EXAM: PORTABLE CHEST 1 VIEW COMPARISON:  Radiograph  01/09/2019, CT 08/08/2015 FINDINGS: No new consolidative opacity is present however the extensive partially calcified pleural plaque seen on multiple prior comparison imaging may obscure detection of underlying airspace disease. Chronic blunting of the left costophrenic sulcus is present compatible with chronic effusion seen on priors. Stable cardiomegaly with a calcified, tortuous aorta as well as features of prior sternotomy and CABG as well as placement of the left atrial occluder device. Dense coronary artery calcifications as well as coronary stenting is noted. No acute osseous or soft tissue abnormality. Telemetry leads overlie the chest. IMPRESSION: 1. No new consolidative opacity however the extensive partially calcified pleural plaque may obscure detection of underlying airspace disease. 2. Chronic left pleural effusion. 3. Stable cardiomegaly and postsurgical changes. Electronically Signed   By: Lovena Le M.D.   On: 05/15/2020 16:24    Procedures .Critical Care Performed by: Breck Coons, MD Authorized by: Breck Coons, MD   Critical care provider statement:    Critical care time (minutes):  45   Critical care was necessary to treat or prevent imminent or life-threatening deterioration of the following conditions: Afib with RVR.   Critical care was time spent personally by me on the following activities:  Discussions with consultants, evaluation of patient's response to treatment, examination of patient, ordering and performing treatments and interventions, ordering and review of laboratory studies, ordering and review of radiographic studies, pulse oximetry, re-evaluation of patient's condition, obtaining history from patient or surrogate, review of old charts and blood draw for specimens   (including critical care time)  Medications Ordered in ED Medications  diltiazem (CARDIZEM) 125 mg in dextrose 5% 125 mL (1 mg/mL) infusion (10 mg/hr Intravenous Rate/Dose Change 05/15/20 1813)    cefTRIAXone (ROCEPHIN) 1 g in sodium chloride 0.9 % 100 mL IVPB (has no administration in time range)  diltiazem (CARDIZEM) injection 10 mg (10 mg Intravenous Given 05/15/20 1614)  sodium chloride 0.9 % bolus 500 mL (0 mLs Intravenous Stopped 05/15/20 1650)  lactated ringers bolus 500 mL (0 mLs Intravenous Stopped 05/15/20 1811)  diltiazem (CARDIZEM) injection 10 mg (10 mg Intravenous Given 05/15/20 1714)    ED Course  I have reviewed the triage vital signs and the nursing notes.  Pertinent labs & imaging results that were available during my care of the patient were reviewed by me and considered in my medical decision making (see chart for details).    MDM Rules/Calculators/A&P                          86 year old male comes in with altered mental status in the setting of a known Covid positive.  He was planned to get outpatient monoclonal antibody therapy tomorrow however security officer found him today when family could not contact him.  He is tachycardic but with normal blood pressure.  He is afebrile.  His laboratory studies show a lactic acidosis leukocytosis with neutrophil predominance.  EKG shows atrial fibrillation with rapid ventricular response.  He is given diltiazem 10 mg.  No other acute ischemic changes on the EKG.  Troponin pending.  Creatinine is elevated from baseline now 2.2.  No significant electrolyte derangements there.  Urinalysis does not show signs of infection.  Cultures will be sent with blood in urine.  Can of the head will be done.  Patient will likely need admission for altered mental status in the setting of Covid.  I consulted the hospitalist, for admission, in the setting of a lactic acidosis A. fib with RVR.  He agrees with the plan thus far will recommend adding Rocephin for covering bacterial infection of unknown source at this time.  This may all be due to Covid infection.  He will need further management as an inpatient.  The patient will be admitted to the  hospitalist.  For the remainder this patient's care please see inpatient team notes.  I will intervene as needed while the patient remains in the emergency department.   CRITICAL CARE Performed by: Breck Coons   Total critical care time: 40 minutes  Critical care time was exclusive of separately billable procedures and treating other patients.  Critical care was necessary to treat or prevent imminent or life-threatening deterioration.  Critical care was time spent personally by me on the following activities: development of treatment plan with patient and/or surrogate as well as nursing, discussions with consultants, evaluation of patient's response to treatment, examination of patient, obtaining history from patient or surrogate, ordering and performing treatments and interventions, ordering and review of laboratory studies, ordering and review of radiographic studies, pulse oximetry and re-evaluation of patient's condition.   Dr Rise Paganini, regeneron tomorrow Final Clinical Impression(s) / ED Diagnoses Final diagnoses:  Disorientation  Lactic acidemia  COVID-19  Atrial fibrillation with rapid ventricular response [facility])    Rx / DC Orders ED Discharge Orders    None       Breck Coons, MD 05/15/20 [phone]

## 2020-05-15 NOTE — ED Notes (Signed)
Lab to add CK

## 2020-05-15 NOTE — Progress Notes (Signed)
Floor coverage  Patient admitted this evening for Covid infection, sepsis, A. fib with RVR, AKI, and acute metabolic encephalopathy.  Head CT was ordered by admitting MD as patient was oriented to self only.  Per admitting MD, no focal neuro deficit.  Received a call from radiologist. Head CT showing: "IMPRESSION: Trace subdural collection overlying left cerebral hemisphere measuring up to 3 mm in thickness. This collection is predominantly intermediate to low density with these components likely reflecting a subacute to chronic subdural hematoma. However, there is a 4 mm curvilinear focus of hyperdensity within this collection overlying the left frontoparietal convexity, which may reflect a tiny focus of acute subdural hemorrhage (versus dural thickening/calcification).  Stable moderate generalized parenchymal atrophy and chronic small vessel ischemic disease.  Mild ethmoid sinus mucosal thickening."  Pl -Patient receiving Eliquis for chronic A. Fib --> HOLD -Frequent neurochecks -I discussed the case with Dr. Zada Finders from neurosurgery who recommends holding Eliquis.  He is not recommending giving an anticoagulation reversal agent at this time.  Recommending repeating head CT tomorrow, if stable restart Eliquis.

## 2020-05-15 NOTE — ED Triage Notes (Signed)
Patient presents to the ED via [facility] with altered mental status. Per EMS, patient came from apartment building where security called EMS d/t AMS. EMS reports patient is COVID positive, no results on file. Patient alert to self. Patient denies pain.

## 2020-05-15 NOTE — ED Notes (Signed)
[name]. son [phone] would like calls with updates, aware pt just arrived

## 2020-05-16 ENCOUNTER — Other Ambulatory Visit: Payer: Self-pay

## 2020-05-16 ENCOUNTER — Encounter (HOSPITAL_COMMUNITY): Payer: Self-pay | Admitting: Internal Medicine

## 2020-05-16 DIAGNOSIS — R6520 Severe sepsis without septic shock: Secondary | ICD-10-CM

## 2020-05-16 DIAGNOSIS — I10 Essential (primary) hypertension: Secondary | ICD-10-CM

## 2020-05-16 DIAGNOSIS — R7989 Other specified abnormal findings of blood chemistry: Secondary | ICD-10-CM

## 2020-05-16 DIAGNOSIS — N179 Acute kidney failure, unspecified: Secondary | ICD-10-CM

## 2020-05-16 DIAGNOSIS — Z8709 Personal history of other diseases of the respiratory system: Secondary | ICD-10-CM

## 2020-05-16 DIAGNOSIS — I4891 Unspecified atrial fibrillation: Secondary | ICD-10-CM

## 2020-05-16 DIAGNOSIS — U071 COVID-19: Secondary | ICD-10-CM

## 2020-05-16 DIAGNOSIS — S065XAA Traumatic subdural hemorrhage with loss of consciousness status unknown, initial encounter: Secondary | ICD-10-CM

## 2020-05-16 DIAGNOSIS — A4151 Sepsis due to Escherichia coli [E. coli]: Principal | ICD-10-CM

## 2020-05-16 DIAGNOSIS — N171 Acute kidney failure with acute cortical necrosis: Secondary | ICD-10-CM

## 2020-05-16 DIAGNOSIS — I2510 Atherosclerotic heart disease of native coronary artery without angina pectoris: Secondary | ICD-10-CM

## 2020-05-16 DIAGNOSIS — I2583 Coronary atherosclerosis due to lipid rich plaque: Secondary | ICD-10-CM

## 2020-05-16 DIAGNOSIS — S065X9A Traumatic subdural hemorrhage with loss of consciousness of unspecified duration, initial encounter: Secondary | ICD-10-CM

## 2020-05-16 LAB — CBC WITH DIFFERENTIAL/PLATELET
Abs Immature Granulocytes: 0.89 10*3/uL — ABNORMAL HIGH (ref 0.00–0.07)
Basophils Absolute: 0 10*3/uL (ref 0.0–0.1)
Basophils Relative: 0 %
Eosinophils Absolute: 0 10*3/uL (ref 0.0–0.5)
Eosinophils Relative: 0 %
HCT: 36.1 % — ABNORMAL LOW (ref 39.0–52.0)
Hemoglobin: 11.9 g/dL — ABNORMAL LOW (ref 13.0–17.0)
Immature Granulocytes: 7 %
Lymphocytes Relative: 7 %
Lymphs Abs: 0.8 10*3/uL (ref 0.7–4.0)
MCH: 31 pg (ref 26.0–34.0)
MCHC: 33 g/dL (ref 30.0–36.0)
MCV: 94 fL (ref 80.0–100.0)
Monocytes Absolute: 0.3 10*3/uL (ref 0.1–1.0)
Monocytes Relative: 3 %
Neutro Abs: 10 10*3/uL — ABNORMAL HIGH (ref 1.7–7.7)
Neutrophils Relative %: 83 %
Platelets: 111 10*3/uL — ABNORMAL LOW (ref 150–400)
RBC: 3.84 MIL/uL — ABNORMAL LOW (ref 4.22–5.81)
RDW: 14.1 % (ref 11.5–15.5)
WBC Morphology: INCREASED
WBC: 12.1 10*3/uL — ABNORMAL HIGH (ref 4.0–10.5)
nRBC: 0 % (ref 0.0–0.2)

## 2020-05-16 LAB — COMPREHENSIVE METABOLIC PANEL
ALT: 137 U/L — ABNORMAL HIGH (ref 0–44)
AST: 178 U/L — ABNORMAL HIGH (ref 15–41)
Albumin: 2.3 g/dL — ABNORMAL LOW (ref 3.5–5.0)
Alkaline Phosphatase: 142 U/L — ABNORMAL HIGH (ref 38–126)
Anion gap: 12 (ref 5–15)
BUN: 42 mg/dL — ABNORMAL HIGH (ref 8–23)
CO2: 20 mmol/L — ABNORMAL LOW (ref 22–32)
Calcium: 6.9 mg/dL — ABNORMAL LOW (ref 8.9–10.3)
Chloride: 109 mmol/L (ref 98–111)
Creatinine, Ser: 1.68 mg/dL — ABNORMAL HIGH (ref 0.61–1.24)
GFR calc Af Amer: 38 mL/min — ABNORMAL LOW (ref >60)
GFR calc non Af Amer: 33 mL/min — ABNORMAL LOW (ref >60)
Glucose, Bld: 100 mg/dL — ABNORMAL HIGH (ref 70–99)
Potassium: 3.4 mmol/L — ABNORMAL LOW (ref 3.5–5.1)
Sodium: 141 mmol/L (ref 135–145)
Total Bilirubin: 2 mg/dL — ABNORMAL HIGH (ref 0.3–1.2)
Total Protein: 5.1 g/dL — ABNORMAL LOW (ref 6.5–8.1)

## 2020-05-16 LAB — BLOOD CULTURE ID PANEL (REFLEXED)

## 2020-05-16 LAB — PHOSPHORUS: Phosphorus: 3.6 mg/dL (ref 2.5–4.6)

## 2020-05-16 LAB — MAGNESIUM: Magnesium: 1.9 mg/dL (ref 1.7–2.4)

## 2020-05-16 LAB — C-REACTIVE PROTEIN: CRP: 14.1 mg/dL — ABNORMAL HIGH (ref <1.0)

## 2020-05-16 LAB — D-DIMER, QUANTITATIVE: D-Dimer, Quant: 3.04 ug/mL-FEU — ABNORMAL HIGH (ref 0.00–0.50)

## 2020-05-16 LAB — FERRITIN: Ferritin: 580 ng/mL — ABNORMAL HIGH (ref 24–336)

## 2020-05-16 MED ORDER — SODIUM CHLORIDE 0.9 % IV SOLN
100.0000 mg | Freq: Two times a day (BID) | INTRAVENOUS | Status: DC
  Administered 2020-05-16: 100 mg via INTRAVENOUS
  Filled 2020-05-16 (×2): qty 100

## 2020-05-16 MED ORDER — SODIUM CHLORIDE 0.9 % IV SOLN
2.0000 g | INTRAVENOUS | Status: DC
  Administered 2020-05-16 – 2020-05-17 (×2): 2 g via INTRAVENOUS
  Filled 2020-05-16 (×2): qty 20

## 2020-05-16 MED ORDER — METOPROLOL TARTRATE 25 MG PO TABS
25.0000 mg | ORAL_TABLET | Freq: Two times a day (BID) | ORAL | Status: DC
  Administered 2020-05-16 – 2020-05-17 (×3): 25 mg via ORAL
  Filled 2020-05-16 (×2): qty 1

## 2020-05-16 MED ORDER — RESOURCE THICKENUP CLEAR PO POWD
Freq: Once | ORAL | Status: AC
  Administered 2020-05-16: 1.4 g via ORAL
  Filled 2020-05-16: qty 125

## 2020-05-16 NOTE — Progress Notes (Signed)
PHARMACY - PHYSICIAN COMMUNICATION CRITICAL VALUE ALERT - BLOOD CULTURE IDENTIFICATION (BCID)  [name] is an 86 y.o. male who presented to [facility] on 05/15/2020 with a chief complaint of altered mental status. Patient is COVID-positive and is currently being treated for E. Coli bacteremia.  Assessment:   BCx 1 bottle positive for Staph spp. Likely represents contamination; no changes needed at this time  Name of physician (or Provider) Contacted: Dr. Cathlean Sauer  Current antibiotics: Ceftriaxone  Changes to prescribed antibiotics recommended:  Patient is on recommended antibiotics - No changes needed  Results for orders placed or performed during the hospital encounter of 05/15/20  Blood Culture ID Panel (Reflexed) (Collected: 05/15/2020  3:35 PM)  Result Value Ref Range   Enterococcus species NOT DETECTED NOT DETECTED   Listeria monocytogenes NOT DETECTED NOT DETECTED   Staphylococcus species NOT DETECTED NOT DETECTED   Staphylococcus aureus (BCID) NOT DETECTED NOT DETECTED   Streptococcus species NOT DETECTED NOT DETECTED   Streptococcus agalactiae NOT DETECTED NOT DETECTED   Streptococcus pneumoniae NOT DETECTED NOT DETECTED   Streptococcus pyogenes NOT DETECTED NOT DETECTED   Acinetobacter baumannii NOT DETECTED NOT DETECTED   Enterobacteriaceae species DETECTED (A) NOT DETECTED   Enterobacter cloacae complex NOT DETECTED NOT DETECTED   Escherichia coli DETECTED (A) NOT DETECTED   Klebsiella oxytoca NOT DETECTED NOT DETECTED   Klebsiella pneumoniae NOT DETECTED NOT DETECTED   Proteus species NOT DETECTED NOT DETECTED   Serratia marcescens NOT DETECTED NOT DETECTED   Carbapenem resistance NOT DETECTED NOT DETECTED   Haemophilus influenzae NOT DETECTED NOT DETECTED   Neisseria meningitidis NOT DETECTED NOT DETECTED   Pseudomonas aeruginosa NOT DETECTED NOT DETECTED   Candida albicans NOT DETECTED NOT DETECTED   Candida glabrata NOT DETECTED NOT DETECTED   Candida krusei  NOT DETECTED NOT DETECTED   Candida parapsilosis NOT DETECTED NOT DETECTED   Candida tropicalis NOT DETECTED NOT DETECTED   Alfonse Spruce, PharmD PGY2 ID Pharmacy Resident [phone]  05/16/2020  3:06 PM

## 2020-05-16 NOTE — Progress Notes (Addendum)
PROGRESS NOTE    [name]  JJK:[id number] DOB: 10-Feb-1918 DOA: 05/15/2020 PCP: Lajean Manes, MD    Brief Narrative:  Patient admitted to the hospital with the working diagnosis of sepsis due to E. coli bacteremia, related to the urine tract infection, in the setting of positive COVID-72.  86 year old male with past medical history for atrial fibrillation, hypertension, coronary disease status post bypass grafting, alcohol abuse who presents for altered mentation.  Patient lives in assisted living facility, been vaccinated in March 2021 for COVID-19, positive contact with infected person recently, he tested positive for COVID-19 3 days ago.  Patient was scheduled to received monoclonal antibody the day before admission, but not able to get medication due to acute illness.  On the day of admission he was noted to be confused and have labored breathing, he was transported to the hospital.  On his initial physical examination his oximetry was 90 to 91% on room air, blood pressure 141/89, heart rate 133, respiratory rate 26, he had dry mucous membranes, he had increased work of breathing, positive accessory muscle use, no wheezing or rhonchi, heart S1-S2, present rhythm, soft abdomen, no lower extremity edema.  Patient was confused and disoriented. Sodium 137, potassium 4.2, chloride 101, bicarb 22, glucose 138, BUN 42, creatinine 2.28, AST 370, ALT 221, troponin I 248, lactic acid 2.5, white count 18.6, hemoglobin 13.6, hematocrit 41.7, platelets 122.  SARS COVID-19 positive.  Urinalysis 11-20 white cells, 0-5 red cells.  Specific gravity 1.016.  Blood culture positive for E. Coli.  CT of the head with subacute left cerebral hemisphere subdural hematoma, 3 mm thickness.  Acute subdural hemorrhage left frontoparietal convexity, 4 mm thickness. His chest radiograph at bilateral pleural calcifications. EKG 133 bpm, left axis deviation, left anterior fascicular block, right bundle branch block, atrial  fibrillation rhythm, positive PVC no significant ST segment or T wave changes.  Assessment & Plan:   Principal Problem:   Sepsis due to Escherichia coli (E. coli) (HCC) Active Problems:   Essential hypertension, benign   Hx of asbestosis   S/P CABG x 4 with atrial clip   CAD (coronary artery disease)   LFT elevation   COVID-19 virus infection   AKI (acute kidney injury) (HCC)   Subdural hematoma (HCC)   Atrial fibrillation with RVR ([place])    1. Sepsis due to E Coli bacteremia, due to urinary tract infection, end organ damage lactic acidosis. AKI and encephalopathy.   This am blood pressure is 121/66, HT 112 to 107, wbc is 12 from 18,6. Blood cultures #2 positive for E Coli. Troponin elevation 248-222.   Will continue hydration with balanced electrolyte solutions at 75 ml per H, antibiotic therapy with IV ceftriaxone, follow on cell count, culture sensitivities and temperature curve.   Troponin elevation due to sepsis, no clinical sings of acute coronary syndrome.   2. Atrial fibrillation with rapid ventricular response. Continue with diltiazem drip for rate control. Will resume anticoagulation with apixaban renal dose.   Resume metoprolol 25 mg po bid, continue telemetry monitoring.   3. AKI on CKD stage 3a/ hypomagnesemia. (base cr 1,2. Old records personally reviewed).  Renal function with serum cr at 1,68, K at 3,4 and serum bicarbonate at 20.  Will continue hydration with LR at 75 ml per. Follow renal panel in am, avoid hypotension and nephrotoxic medications.   4. Elevated liver enzymes, likely due to hypoperfusion, liver shock. AST 178, ALT 137, T bil 2,0, will continue to maintain hemodynamics, and follow  up on liver panel in am.   5. History of alcohol abuse. No signs of acute withdrawal, for now will dc benzodiazepines, to prevent oversedation.   6. COVID 19 infection. Patient has been fully vaccinated, his chest film has no infiltrates.  Will continue Remdesivir for 5  doses, but in the setting of gram negative bacteremia will hold on steroids.   7. Subdural hematoma. Left small. Subacute to acute. No indication for surgery, will continue to hold on anticoagulation, continue close neuro checks.   Consult nutrition   Patient continue to be at high risk for for worsening sepsis. Patient critically ill with imminent risk for deterioration, critical care time 60 minutes.   Status is: Inpatient  Remains inpatient appropriate because:Inpatient level of care appropriate due to severity of illness   Dispo: The patient is from: Home              Anticipated d/c is to: Home              Anticipated d/c date is: 3 days              Patient currently is not medically stable to d/c.   DVT prophylaxis: Enoxaparin   Code Status:   dnr   Family Communication:   I spoke over the phone with the patient's son about patient's  condition, plan of care, prognosis and all questions were addressed.    Antimicrobials:   Ceftriaxone     Subjective: Patient is not feeling well, no nausea or vomiting, no chest pain or dyspnea.   Objective: Vitals:   05/16/20 0700 05/16/20 0730 05/16/20 0846 05/16/20 0847  BP: (!) 134/81 118/79 (!) 165/135   Pulse: (!) 114 88  102  Resp: 23 20  (!) 28  Temp:      TempSrc:      SpO2: 100% 99%  95%    Intake/Output Summary (Last 24 hours) at 05/16/2020 0921 Last data filed at 05/16/2020 0737 Gross per 24 hour  Intake 1565.07 ml  Output 100 ml  Net 1465.07 ml   There were no vitals filed for this visit.  Examination:   General: deconditioned and ill looking appearing  Neurology: Awake and alert, non focal  E ENT: positive pallor, no icterus, oral mucosa dry Cardiovascular: No JVD. S1-S2 present, rhythmic, no gallops, rubs, or murmurs. No lower extremity edema. Pulmonary: positive breath sounds bilaterally, adequate air movement, no wheezing, rhonchi or rales. Gastrointestinal. Abdomen soft. Mild tender at the pubic region.    Skin. No rashes Musculoskeletal: no joint deformities     Data Reviewed: I have personally reviewed following labs and imaging studies  CBC: Recent Labs  Lab 05/15/20 1533 05/16/20 0500  WBC 18.6* 12.1*  NEUTROABS 15.3* 10.0*  HGB 13.6 11.9*  HCT 41.7 36.1*  MCV 93.9 94.0  PLT 122* 983*   Basic Metabolic Panel: Recent Labs  Lab 05/15/20 1533 05/16/20 0500  NA 137 141  K 4.2 3.4*  CL 101 109  CO2 22 20*  GLUCOSE 138* 100*  BUN 42* 42*  CREATININE 2.28* 1.68*  CALCIUM 8.1* 6.9*  MG  --  1.9  PHOS  --  3.6   GFR: CrCl cannot be calculated (Unknown ideal weight.). Liver Function Tests: Recent Labs  Lab 05/15/20 1533 05/16/20 0500  AST 370* 178*  ALT 221* 137*  ALKPHOS 211* 142*  BILITOT 3.5* 2.0*  PROT 6.2* 5.1*  ALBUMIN 3.0* 2.3*   No results for input(s): LIPASE, AMYLASE in the  last 168 hours. No results for input(s): AMMONIA in the last 168 hours. Coagulation Profile: Recent Labs  Lab 05/15/20 1533  INR 1.5*   Cardiac Enzymes: Recent Labs  Lab 05/15/20 1533  CKTOTAL 150   BNP (last 3 results) No results for input(s): PROBNP in the last 8760 hours. HbA1C: No results for input(s): HGBA1C in the last 72 hours. CBG: No results for input(s): GLUCAP in the last 168 hours. Lipid Profile: No results for input(s): CHOL, HDL, LDLCALC, TRIG, CHOLHDL, LDLDIRECT in the last 72 hours. Thyroid Function Tests: No results for input(s): TSH, T4TOTAL, FREET4, T3FREE, THYROIDAB in the last 72 hours. Anemia Panel: Recent Labs    05/16/20 0500  FERRITIN 39*      Radiology Studies: I have reviewed all of the imaging during this hospital visit personally     Scheduled Meds: . dexamethasone  6 mg Oral Q24H  . folic acid  1 mg Oral Daily  . ipratropium  2 puff Inhalation Q6H  . multivitamin with minerals  1 tablet Oral Daily  . thiamine  100 mg Oral Daily   Or  . thiamine  100 mg Intravenous Daily   Continuous Infusions: . cefTRIAXone  (ROCEPHIN)  IV    . diltiazem (CARDIZEM) infusion 12.5 mg/hr (05/16/20 0747)  . remdesivir 100 mg in NS 100 mL       LOS: 1 day        Meet [place] Gerome Apley, MD

## 2020-05-16 NOTE — ED Notes (Signed)
Red tinged urine noted in brief, pt continues to have expressive aphasia, put does nod and gesture appropriately. New brief placed on patient, anatomy will not support condom cath.

## 2020-05-16 NOTE — Progress Notes (Addendum)
Pt arrived on you unit around 1130. Pt is AOx1. Confuse but easily reoriented. On tele. On 2L [place]. Able to move in bed with some assistance. Condom cath in place. Pt urine is dark red and a clot was discovered. Attending was notified. Bladder was scan for concern of possible urine retention. Bladder scan was  183mL. Will continue to monitor. Admission wasn't done due to patient's confusion. See flowsheet for full assessment. Safety maintained. Call light within reach. Will continue to monitor.

## 2020-05-16 NOTE — Progress Notes (Signed)
   05/16/20 0932  Assess: MEWS Score  Temp 98.3 F (36.8 C)  BP (!) 121/62  Pulse Rate (!) 115  ECG Heart Rate (!) 113  Resp 22  SpO2 92 %  O2 Device Nasal Cannula  O2 Flow Rate (L/min) 2 L/min  Assess: MEWS Score  MEWS Temp 0  MEWS Systolic 0  MEWS Pulse 2  MEWS RR 1  MEWS LOC 0  MEWS Score 3  MEWS Score Color Yellow  Assess: if the MEWS score is Yellow or Red  Were vital signs taken at a resting state? Yes  Focused Assessment No change from prior assessment  Early Detection of Sepsis Score *See Row Information* High  MEWS guidelines implemented *See Row Information* Yes  Take Vital Signs  Increase Vital Sign Frequency  Yellow: Q 2hr X 2 then Q 4hr X 2, if remains yellow, continue Q 4hrs  Escalate  MEWS: Escalate Yellow: discuss with charge nurse/RN and consider discussing with provider and RRT  Notify: Charge Nurse/RN  Name of Charge Nurse/RN Notified Khoa   Date Charge Nurse/RN Notified 05/16/20  Time Charge Nurse/RN Notified [phone]  Document  Patient Outcome Other (Comment) (New admit from ED pt currently on cardizem drip)  Progress note created (see row info) Yes   Charge nurse notified pts primary nurse.

## 2020-05-16 NOTE — Progress Notes (Signed)
Patient resides at [facility]. Will continue to follow for discharge needs.   Axxel Gude LCSW

## 2020-05-16 NOTE — Evaluation (Signed)
Clinical/Bedside Swallow Evaluation Patient Details  Name: [name] MRN: [id number] Date of Birth: 1918-03-04  Today's Date: 05/16/2020 Time: SLP Start Time (ACUTE ONLY): 67 SLP Stop Time (ACUTE ONLY): 1610 SLP Time Calculation (min) (ACUTE ONLY): 20 min  Past Medical History:  Past Medical History:  Diagnosis Date  . Atrial fibrillation ([place])    with rapid ventricular repsonse  . Bone lesion    lytic  . Debility   . HTN (hypertension)   . S/P CABG x 4 with atrial clip 10/21/2013  . Small bowel obstruction [facility])    Past Surgical History:  Past Surgical History:  Procedure Laterality Date  . APPENDECTOMY    . CLIPPING OF ATRIAL APPENDAGE  10/21/2013   Procedure: CLIPPING OF ATRIAL APPENDAGE;  Surgeon: Brent Isaac, MD;  Location: [place];  Service: Open Heart Surgery;;  . CORONARY ARTERY BYPASS GRAFT N/A 10/21/2013   Procedure: CORONARY ARTERY BYPASS GRAFTING (CABG);  Surgeon: Brent Isaac, MD;  Location: [place];  Service: Open Heart Surgery;  Laterality: N/A;  . HERNIA REPAIR     x2  . INTRAOPERATIVE TRANSESOPHAGEAL ECHOCARDIOGRAM N/A 10/21/2013   Procedure: INTRAOPERATIVE TRANSESOPHAGEAL ECHOCARDIOGRAM;  Surgeon: Brent Isaac, MD;  Location: [place];  Service: Open Heart Surgery;  Laterality: N/A;  . LEFT HEART CATHETERIZATION WITH CORONARY ANGIOGRAM N/A 10/20/2013   Procedure: LEFT HEART CATHETERIZATION WITH CORONARY ANGIOGRAM;  Surgeon: Troy Sine, MD;  Location: [facility] CATH LAB;  Service: Cardiovascular;  Laterality: N/A;  . PARATHYROIDECTOMY     HPI:  86 year old male with past medical history for atrial fibrillation, hypertension, coronary disease status post bypass grafting, alcohol abuse who presents for altered mentation.  Patient lives in assisted living facility, was vaccinated in March 2021 for COVID-19, positive contact with infected person recently, then tested positive for COVID-19.  Admitted to [facility] 05/15/20 with working dx of sepsis secondary to UTI in  setting of COVID-19.    Assessment / Plan / Recommendation Clinical Impression  Pt presents with concerns for dysphagia and heightened aspiration risk associated with acute medical condition. He was alert, cooperative; currently on 2L [place] and saturating in the low 90s.  During assessment, SP02 would drop into mid 80s during coughing spells but would quickly rise back into the 90s.  Pt presented with functional oral phase, but pharyngeal phase was c/b multiple swallows per bolus, grimacing (denied pain or swallowing problems), and overt coughing with consumption of thin liquids.  He gagged x2 but denied regurgitation.  His clinical presentation appeared consistent with a potential esophageal and/or pharyngeal dysphagia, but this is difficult to tease out at bedside.  For now (next 24-48 hours), recommend changing diet to dysphagia 3, nectar thick liquids; crush meds and give with puree as an extra precaution.  SLP will follow for safety/diet advancement and determine if Mr. Shin would benefit from further instrumental assessment. D/W RN.   SLP Visit Diagnosis: Dysphagia, unspecified (R13.10)    Aspiration Risk       Diet Recommendation   dysphagia 3, nectar thick liquids  Medication Administration: Crushed with puree    Other  Recommendations Other Recommendations: Order thickener from pharmacy   Follow up Recommendations Other (comment) (tba)      Frequency and Duration min 2x/week  2 weeks       Prognosis Prognosis for Safe Diet Advancement: Good      Swallow Study   General HPI: 86 year old male with past medical history for atrial fibrillation, hypertension, coronary disease status post  bypass grafting, alcohol abuse who presents for altered mentation.  Patient lives in assisted living facility, was vaccinated in March 2021 for COVID-19, positive contact with infected person recently, then tested positive for COVID-19.  Admitted to [facility] 05/15/20 with working dx of sepsis secondary to UTI  in setting of COVID-19.  Type of Study: Bedside Swallow Evaluation Previous Swallow Assessment: no Diet Prior to this Study: Regular;Thin liquids Temperature Spikes Noted: No Respiratory Status: Nasal cannula (2l) History of Recent Intubation: No Behavior/Cognition: Alert;Pleasant mood;Confused Oral Cavity Assessment: Within Functional Limits Oral Care Completed by SLP: No Vision: Functional for self-feeding Self-Feeding Abilities: Needs assist Patient Positioning: Upright in bed Baseline Vocal Quality: Normal Volitional Cough: Strong Volitional Swallow: Able to elicit    Oral/Motor/Sensory Function     Ice Chips Ice chips: Within functional limits   Thin Liquid Thin Liquid: Impaired Pharyngeal  Phase Impairments: Cough - Immediate    Nectar Thick Nectar Thick Liquid: Impaired Presentation: Cup Pharyngeal Phase Impairments: Multiple swallows   Honey Thick Honey Thick Liquid: Not tested   Puree Puree: Within functional limits   Solid     Solid: Within functional limits      [name] 05/16/2020,4:23 PM  [name], [place] Office number [phone] Pager [phone]

## 2020-05-16 NOTE — Progress Notes (Signed)
PHARMACY - PHYSICIAN COMMUNICATION CRITICAL VALUE ALERT - BLOOD CULTURE IDENTIFICATION (BCID)  [name] is an 85 y.o. male who presented to [facility] on 05/15/2020 with a chief complaint of AMS/sepsis  Assessment:  1/2 blood cultures growing E. coli  Name of physician (or Provider) Contacted: Dr. Cathlean Sauer  Current antibiotics: Doxycycline  Changes to prescribed antibiotics recommended:  Will change to Rocephin 2 g IV q24h   Results for orders placed or performed during the hospital encounter of 05/15/20  Blood Culture ID Panel (Reflexed) (Collected: 05/15/2020  3:35 PM)  Result Value Ref Range   Enterococcus species NOT DETECTED NOT DETECTED   Listeria monocytogenes NOT DETECTED NOT DETECTED   Staphylococcus species NOT DETECTED NOT DETECTED   Staphylococcus aureus (BCID) NOT DETECTED NOT DETECTED   Streptococcus species NOT DETECTED NOT DETECTED   Streptococcus agalactiae NOT DETECTED NOT DETECTED   Streptococcus pneumoniae NOT DETECTED NOT DETECTED   Streptococcus pyogenes NOT DETECTED NOT DETECTED   Acinetobacter baumannii NOT DETECTED NOT DETECTED   Enterobacteriaceae species DETECTED (A) NOT DETECTED   Enterobacter cloacae complex NOT DETECTED NOT DETECTED   Escherichia coli DETECTED (A) NOT DETECTED   Klebsiella oxytoca NOT DETECTED NOT DETECTED   Klebsiella pneumoniae NOT DETECTED NOT DETECTED   Proteus species NOT DETECTED NOT DETECTED   Serratia marcescens NOT DETECTED NOT DETECTED   Carbapenem resistance NOT DETECTED NOT DETECTED   Haemophilus influenzae NOT DETECTED NOT DETECTED   Neisseria meningitidis NOT DETECTED NOT DETECTED   Pseudomonas aeruginosa NOT DETECTED NOT DETECTED   Candida albicans NOT DETECTED NOT DETECTED   Candida glabrata NOT DETECTED NOT DETECTED   Candida krusei NOT DETECTED NOT DETECTED   Candida parapsilosis NOT DETECTED NOT DETECTED   Candida tropicalis NOT DETECTED NOT DETECTED    Caryl Pina 05/16/2020  7:47 AM

## 2020-05-17 DIAGNOSIS — L8990 Pressure ulcer of unspecified site, unspecified stage: Secondary | ICD-10-CM | POA: Insufficient documentation

## 2020-05-17 LAB — CBC WITH DIFFERENTIAL/PLATELET
Abs Immature Granulocytes: 0.15 10*3/uL — ABNORMAL HIGH (ref 0.00–0.07)
Basophils Absolute: 0 10*3/uL (ref 0.0–0.1)
Basophils Relative: 0 %
Eosinophils Absolute: 0 10*3/uL (ref 0.0–0.5)
Eosinophils Relative: 0 %
HCT: 38.2 % — ABNORMAL LOW (ref 39.0–52.0)
Hemoglobin: 12.8 g/dL — ABNORMAL LOW (ref 13.0–17.0)
Immature Granulocytes: 2 %
Lymphocytes Relative: 11 %
Lymphs Abs: 0.9 10*3/uL (ref 0.7–4.0)
MCH: 31.1 pg (ref 26.0–34.0)
MCHC: 33.5 g/dL (ref 30.0–36.0)
MCV: 92.9 fL (ref 80.0–100.0)
Monocytes Absolute: 0.2 10*3/uL (ref 0.1–1.0)
Monocytes Relative: 3 %
Neutro Abs: 6.7 10*3/uL (ref 1.7–7.7)
Neutrophils Relative %: 84 %
Platelets: 118 10*3/uL — ABNORMAL LOW (ref 150–400)
RBC: 4.11 MIL/uL — ABNORMAL LOW (ref 4.22–5.81)
RDW: 14.3 % (ref 11.5–15.5)
WBC: 8 10*3/uL (ref 4.0–10.5)
nRBC: 0 % (ref 0.0–0.2)

## 2020-05-17 LAB — BLOOD CULTURE ID PANEL (REFLEXED) - BCID2

## 2020-05-17 LAB — COMPREHENSIVE METABOLIC PANEL
ALT: 114 U/L — ABNORMAL HIGH (ref 0–44)
AST: 93 U/L — ABNORMAL HIGH (ref 15–41)
Albumin: 2.7 g/dL — ABNORMAL LOW (ref 3.5–5.0)
Alkaline Phosphatase: 135 U/L — ABNORMAL HIGH (ref 38–126)
Anion gap: 10 (ref 5–15)
BUN: 45 mg/dL — ABNORMAL HIGH (ref 8–23)
CO2: 25 mmol/L (ref 22–32)
Calcium: 8.4 mg/dL — ABNORMAL LOW (ref 8.9–10.3)
Chloride: 106 mmol/L (ref 98–111)
Creatinine, Ser: 1.51 mg/dL — ABNORMAL HIGH (ref 0.61–1.24)
GFR calc Af Amer: 44 mL/min — ABNORMAL LOW (ref >60)
GFR calc non Af Amer: 38 mL/min — ABNORMAL LOW (ref >60)
Glucose, Bld: 112 mg/dL — ABNORMAL HIGH (ref 70–99)
Potassium: 3.5 mmol/L (ref 3.5–5.1)
Sodium: 141 mmol/L (ref 135–145)
Total Bilirubin: 1.4 mg/dL — ABNORMAL HIGH (ref 0.3–1.2)
Total Protein: 5.8 g/dL — ABNORMAL LOW (ref 6.5–8.1)

## 2020-05-17 MED ORDER — METOPROLOL TARTRATE 50 MG PO TABS
50.0000 mg | ORAL_TABLET | Freq: Two times a day (BID) | ORAL | Status: DC
  Administered 2020-05-17 – 2020-05-20 (×6): 50 mg via ORAL
  Filled 2020-05-17 (×6): qty 1

## 2020-05-17 MED ORDER — ENSURE ENLIVE PO LIQD
237.0000 mL | Freq: Three times a day (TID) | ORAL | Status: DC
  Administered 2020-05-17 – 2020-05-19 (×4): 237 mL via ORAL

## 2020-05-17 MED ORDER — METOPROLOL TARTRATE 25 MG PO TABS
25.0000 mg | ORAL_TABLET | Freq: Once | ORAL | Status: AC
  Administered 2020-05-17: 25 mg via ORAL
  Filled 2020-05-17: qty 1

## 2020-05-17 NOTE — Progress Notes (Addendum)
PROGRESS NOTE    Hanford Lust Bonus  ZOX:[id number] DOB: [birth year] DOA: 05/15/2020 PCP: Lajean Manes, MD    Brief Narrative:  Patient admitted to the hospital with the working diagnosis of sepsis due to E. coli bacteremia, related to the urine tract infection, in the setting of positive COVID-57.  86 year old male with past medical history for atrial fibrillation, hypertension, coronary disease status post bypass grafting, alcohol abuse who presents for altered mentation.  Patient lives in assisted living facility, been vaccinated in March 2021 for COVID-19, positive contact with infected person recently, he tested positive for COVID-19 3 days ago.  Patient was scheduled to received monoclonal antibody the day before admission, but not able to get medication due to acute illness.  On the day of admission he was noted to be confused and have labored breathing, he was transported to the hospital.  On his initial physical examination his oximetry was 90 to 91% on room air, blood pressure 141/89, heart rate 133, respiratory rate 26, he had dry mucous membranes, he had increased work of breathing, positive accessory muscle use, no wheezing or rhonchi, heart S1-S2, present rhythm, soft abdomen, no lower extremity edema.  Patient was confused and disoriented. Sodium 137, potassium 4.2, chloride 101, bicarb 22, glucose 138, BUN 42, creatinine 2.28, AST 370, ALT 221, troponin I 248, lactic acid 2.5, white count 18.6, hemoglobin 13.6, hematocrit 41.7, platelets 122.  SARS COVID-19 positive.  Urinalysis 11-20 white cells, 0-5 red cells.  Specific gravity 1.016.  Blood culture positive for E. Coli.  CT of the head with subacute left cerebral hemisphere subdural hematoma, 3 mm thickness.  Acute subdural hemorrhage left frontoparietal convexity, 4 mm thickness. His chest radiograph at bilateral pleural calcifications. EKG 133 bpm, left axis deviation, left anterior fascicular block, right bundle branch block, atrial  fibrillation rhythm, positive PVC no significant ST segment or T wave changes.  Patient placed on IV antibiotic therapy, no signs viral pneumonia. He has been clinically responding well.   Assessment & Plan:   Principal Problem:   Sepsis due to Escherichia coli (E. coli) (HCC) Active Problems:   Essential hypertension, benign   Hx of asbestosis   S/P CABG x 4 with atrial clip   CAD (coronary artery disease)   LFT elevation   COVID-19 virus infection   AKI (acute kidney injury) (HCC)   Subdural hematoma (HCC)   Atrial fibrillation with RVR (HCC)   Pressure injury of skin   1. Sepsis due to E Coli bacteremia, due to urinary tract infection, end organ damage lactic acidosis. AKI and encephalopathy.  Blood cultures #1 positive for E Coli. Troponin elevation 248-222. His mentation has improved, continue to be very weak and deconditioned. Blood pressure this am 117/83. Wbc is down to 8,0.   Continue antibiotic therapy with ceftriaxone, pending culture sensitivities. Blood culture #2 with staphylococcus capitis, likely contamination.  IV fluids have been discontinued, following a restrictive IV fluids strategy.    Troponin elevation due to sepsis, no clinical sings of acute coronary syndrome.   2. Atrial fibrillation with rapid ventricular response. Rate controlled atrial fibrillation, increase metoprolol to 50 mg po bid and wean off diltiazem infusion, target HR less than 130 bpm.   3. AKI on CKD stage 3a/ hypomagnesemia. (base cr 1,2. Old records personally reviewed).  Renal function with serum cr trending down, 1.51 with K at 3,5 and serum bicarbonate at 25.   Will follow up on renal function and electrolytes in am, avoid hypotension and nephrotoxic  medications. Continue to hold on furosemide for now.   4. Elevated liver enzymes, likely due to hypoperfusion, liver shock. AST 93, ALT 114, T bil 1,4.  Patient with no abdominal pain, no nausea or vomiting. Poor appetite.    5.  History of alcohol abuse. Continue neuro checks per unit protocol, continue to hold on benzodiazepines to prevent oversedation.  6. COVID 19 infection. Patient has been fully vaccinated, his chest film has no infiltrates. Ruled out for viral pneumonia.   Plan to complete 5 days of Remdesivir and continue close monitoring. No indication for systemic steroids.   7. Subdural hematoma. Left small. Subacute to acute. Patient's mentation has improved, will continue to hold on anticoagulation.  Follow with PT/OT recommendations, out of bed to chair tid with meals. Continue with dysphagia 3 diet per speech recommendations.   8. Sacrum stage one pressure ulcer present on admission. Continue with local wound care. Follow with nutrition recommendations.   Patient continue to be at high risk for worsening sepsis Status is: Inpatient  Remains inpatient appropriate because:Inpatient level of care appropriate due to severity of illness   Dispo: The patient is from: Home              Anticipated d/c is to: Home              Anticipated d/c date is: 3 days              Patient currently is not medically stable to d/c.   DVT prophylaxis: scd   Code Status:    dnr   Family Communication:  I spoke over the phone with the patient's son about patient's  condition, plan of care, prognosis and all questions were addressed.      Skin Documentation: Pressure Injury 05/16/20 Sacrum Stage 1 -  Intact skin with non-blanchable redness of a localized area usually over a bony prominence. red non blanching (Active)  05/16/20 0930  Location: Sacrum  Location Orientation:   Staging: Stage 1 -  Intact skin with non-blanchable redness of a localized area usually over a bony prominence.  Wound Description (Comments): red non blanching  Present on Admission: Yes      Antimicrobials:   Ceftriaxone IV     Subjective: Patient continue to feel very weak and deconditioned, his mentation has improved, he  reports poor appetite, no nausea or vomiting, no chest pain or dyspnea.   Objective: Vitals:   05/16/20 2200 05/17/20 0000 05/17/20 0400 05/17/20 0733  BP: 123/64 118/67 129/64 (!) 129/55  Pulse: 97 93 95 91  Resp: 18 16 16 15   Temp: 98.4 F (36.9 C) 98.2 F (36.8 C) 98.6 F (37 C) 98.8 F (37.1 C)  TempSrc: Oral Oral Oral Oral  SpO2: 95% 95% 97% 96%    Intake/Output Summary (Last 24 hours) at 05/17/2020 1109 Last data filed at 05/17/2020 0910 Gross per 24 hour  Intake 471 ml  Output 1100 ml  Net -629 ml   There were no vitals filed for this visit.  Examination:   General:deconditioned and ill looking appearing  Neurology: Awake and alert, non focal  E ENT: positive pallor, no icterus, oral mucosa moist Cardiovascular: No JVD. S1-S2 present, rhythmic, no gallops, rubs, or murmurs. Trace lower extremity edema. Pulmonary: positive breath sounds bilaterally,no wheezing Gastrointestinal. Abdomen soft and non tender Skin. No rashes Musculoskeletal: no joint deformities     Data Reviewed: I have personally reviewed following labs and imaging studies  CBC: Recent Labs  Lab  05/15/20 1533 05/16/20 0500 05/17/20 0500  WBC 18.6* 12.1* 8.0  NEUTROABS 15.3* 10.0* 6.7  HGB 13.6 11.9* 12.8*  HCT 41.7 36.1* 38.2*  MCV 93.9 94.0 92.9  PLT 122* 111* 660*   Basic Metabolic Panel: Recent Labs  Lab 05/15/20 1533 05/16/20 0500 05/17/20 0500  NA 137 141 141  K 4.2 3.4* 3.5  CL 101 109 106  CO2 22 20* 25  GLUCOSE 138* 100* 112*  BUN 42* 42* 45*  CREATININE 2.28* 1.68* 1.51*  CALCIUM 8.1* 6.9* 8.4*  MG  --  1.9  --   PHOS  --  3.6  --    GFR: CrCl cannot be calculated (Unknown ideal weight.). Liver Function Tests: Recent Labs  Lab 05/15/20 1533 05/16/20 0500 05/17/20 0500  AST 370* 178* 93*  ALT 221* 137* 114*  ALKPHOS 211* 142* 135*  BILITOT 3.5* 2.0* 1.4*  PROT 6.2* 5.1* 5.8*  ALBUMIN 3.0* 2.3* 2.7*   No results for input(s): LIPASE, AMYLASE in the last 168  hours. No results for input(s): AMMONIA in the last 168 hours. Coagulation Profile: Recent Labs  Lab 05/15/20 1533  INR 1.5*   Cardiac Enzymes: Recent Labs  Lab 05/15/20 1533  CKTOTAL 150   BNP (last 3 results) No results for input(s): PROBNP in the last 8760 hours. HbA1C: No results for input(s): HGBA1C in the last 72 hours. CBG: No results for input(s): GLUCAP in the last 168 hours. Lipid Profile: No results for input(s): CHOL, HDL, LDLCALC, TRIG, CHOLHDL, LDLDIRECT in the last 72 hours. Thyroid Function Tests: No results for input(s): TSH, T4TOTAL, FREET4, T3FREE, THYROIDAB in the last 72 hours. Anemia Panel: Recent Labs    05/16/20 0500  FERRITIN 62*      Radiology Studies: I have reviewed all of the imaging during this hospital visit personally     Scheduled Meds: . folic acid  1 mg Oral Daily  . ipratropium  2 puff Inhalation Q6H  . metoprolol tartrate  50 mg Oral BID  . multivitamin with minerals  1 tablet Oral Daily  . thiamine  100 mg Oral Daily   Continuous Infusions: . cefTRIAXone (ROCEPHIN)  IV 2 g (05/17/20 0910)  . diltiazem (CARDIZEM) infusion 10 mg/hr (05/17/20 0921)  . remdesivir 100 mg in NS 100 mL 100 mg (05/17/20 1013)     LOS: 2 days        Sosha Shepherd Gerome Apley, MD

## 2020-05-17 NOTE — Plan of Care (Signed)
  Problem: Clinical Measurements: Goal: Respiratory complications will improve 05/17/2020 0933 by Mariane Baumgarten, RN Outcome: Progressing 05/17/2020 0933 by Mariane Baumgarten, RN Outcome: Progressing

## 2020-05-17 NOTE — Progress Notes (Signed)
  Speech Language Pathology Treatment: Dysphagia  Patient Details Name: [name] MRN: [id number] DOB: 1917-10-21 Today's Date: 05/17/2020 Time: 0017-4944 SLP Time Calculation (min) (ACUTE ONLY): 15 min  Assessment / Plan / Recommendation Clinical Impression  Pt sleepy; amenable to eating/drinking a little but only consumed minimal POs despite gentle encouragement.  Repositioned in bed to optimize participation. Thin liquids continue to elicit multiple sub-swallows per sip and wet cough.  Nectar-thick liquids did not elicit as frequent symptoms.  Pt reports no hx of esophageal deficits.  Review of recent CT cervical spine shows degenerative changes in vertebrae, but imaging does not reveal changes that would potentially interfere with epiglottic closure over airway.  Pt did not desaturate when drinking/coughing today; he maintained an Sp02 >95% on room air.  Recommend continuing more conservative diet of dysphagia 3/ nectar thick liquids for now given concerns for potential aspiration of thinner liquids.  SLP will continue to follow; may benefit from [facility] prior to D/C.   HPI HPI: 86 year old male with past medical history for atrial fibrillation, hypertension, coronary disease status post bypass grafting, alcohol abuse who presents for altered mentation.  Patient lives in assisted living facility, was vaccinated in March 2021 for COVID-19, positive contact with infected person recently, then tested positive for COVID-19.  Ad[facility] to [facility]20 with working dx of sepsis secondary to UTI in setting of COVID-19.       SLP Plan  Continue with current plan of care       Recommendations  Diet recommendations: Dysphagia 3 (mechanical soft);Nectar-thick liquid Liquids provided via: Cup;Straw Medication Administration: Crushed with puree Supervision: Staff to assist with self feeding;Patient able to self feed Compensations: Minimize environmental distractions Postural Changes and/or Swallow  Maneuvers: Seated upright 90 degrees                Oral Care Recommendations: Oral care BID Follow up Recommendations: Other (comment) (tba) SLP Visit Diagnosis: Dysphagia, unspecified (R13.10) Plan: Continue with current plan of care       Holden. [name], [place] Office number [phone] Pager [phone]  Assunta Curtis 05/17/2020, 3:49 PM

## 2020-05-17 NOTE — Progress Notes (Signed)
Initial Nutrition Assessment  DOCUMENTATION CODES:   Not applicable  INTERVENTION:  Provide Ensure Enlive po TID, each supplement provides 350 kcal and 20 grams of protein.  Encourage adequate PO intake.   Recommend obtaining new weight to fully assess weight trends.   NUTRITION DIAGNOSIS:   Increased nutrient needs related to acute illness (COVID) as evidenced by estimated needs.  GOAL:   Patient will meet greater than or equal to 90% of their needs  MONITOR:   PO intake, Supplement acceptance, Skin, Weight trends, Labs, I & O's  REASON FOR ASSESSMENT:   Consult Assessment of nutrition requirement/status  ASSESSMENT:   86 year old male with past medical history for atrial fibrillation, hypertension, coronary disease status post bypass grafting, alcohol abuse who presents for altered mentation with increased labor of breathing. Pt presents with working diagnosis of sepsis due to E. coli bacteremia, related to the urine tract infection, in the setting of positive COVID-19.  RD working remotely.  Pt poor historian. RD unable to obtain pt nutrition history at this time. Pt reports poor appetite. RD to order nutritional supplements to aid in caloric and protein needs. Noted no recent weight recorded. Recommend obtaining new weight to fully assess weight trends. Unable to complete Nutrition-Focused physical exam at this time.   Labs and medications reviewed.   Diet Order:   Diet Order            DIET DYS 3 Room service appropriate? No; Fluid consistency: Nectar Thick  Diet effective now                 EDUCATION NEEDS:   Not appropriate for education at this time  Skin:  Skin Assessment: Skin Integrity Issues: Skin Integrity Issues:: Stage I Stage I: sacrum  Last BM:  Unknown  Height:   Ht Readings from Last 1 Encounters:  02/22/20 5\' 6"  (1.676 m)    Weight:   Wt Readings from Last 1 Encounters:  02/22/20 58.1 kg    Estimated Nutritional Needs:    Kcal:  1600-1850  Protein:  70-85 grams  Fluid:  >/= 1.6 L/day  Corrin Parker, MS, RD, LDN RD pager number/after hours weekend pager number on Amion.

## 2020-05-17 NOTE — Plan of Care (Signed)
  Problem: Clinical Measurements: Goal: Ability to maintain clinical measurements within normal limits will improve Outcome: Progressing Goal: Will remain free from infection Outcome: Progressing Goal: Diagnostic test results will improve Outcome: Progressing Goal: Respiratory complications will improve Outcome: Progressing Goal: Cardiovascular complication will be avoided Outcome: Progressing   Problem: Coping: Goal: Level of anxiety will decrease Outcome: Progressing   Problem: Elimination: Goal: Will not experience complications related to bowel motility Outcome: Progressing Goal: Will not experience complications related to urinary retention Outcome: Progressing   Problem: Pain Managment: Goal: General experience of comfort will improve Outcome: Progressing   Problem: Safety: Goal: Ability to remain free from injury will improve Outcome: Progressing   Problem: Skin Integrity: Goal: Risk for impaired skin integrity will decrease Outcome: Progressing   Problem: Coping: Goal: Psychosocial and spiritual needs will be supported Outcome: Progressing   Problem: Respiratory: Goal: Will maintain a patent airway Outcome: Progressing Goal: Complications related to the disease process, condition or treatment will be avoided or minimized Outcome: Progressing   Problem: Education: Goal: Knowledge of General Education information will improve Description: Including pain rating scale, medication(s)/side effects and non-pharmacologic comfort measures Outcome: Not Progressing   Problem: Health Behavior/Discharge Planning: Goal: Ability to manage health-related needs will improve Outcome: Not Progressing   Problem: Activity: Goal: Risk for activity intolerance will decrease Outcome: Not Progressing   Problem: Nutrition: Goal: Adequate nutrition will be maintained Outcome: Not Progressing   Problem: Education: Goal: Knowledge of risk factors and measures for prevention  of condition will improve Outcome: Not Progressing

## 2020-05-17 NOTE — TOC Initial Note (Signed)
Transition of Care [facility]) - Initial/Assessment Note    Patient Details  Name: [name] MRN: [id number] Date of Birth: 07-Sep-1918  [facility] CM/SW Contact:    Benard Halsted, LCSW Phone Number: 05/17/2020, 4:39 PM  Clinical Narrative:                 CSW received consult for possible SNF placement at time of discharge. CSW spoke with patient's son regarding PT recommendation of SNF placement at time of discharge. Patient's son reported that patient resides at [facility]. Patient's son expressed understanding of PT recommendation and is agreeable to SNF placement at time of discharge. Patient's son reports preference for [facility] SNF, but CSW made him aware that per Claiborne Billings at [place], they do not have any rehab beds available. CSW discussed insurance authorization process and provided Medicare SNF ratings list and discussed that there are currently only two COVID accepting facilities to choose from. Patient's son expressed being hopeful for patient to rehab and feel better soon. No further questions reported at this time. CSW to continue to follow and assist with discharge planning needs.   Expected Discharge Plan: Skilled Nursing Facility Barriers to Discharge: Continued Medical Work up, Ship broker, SNF Pending bed offer   Patient Goals and CMS Choice Patient states their goals for this hospitalization and ongoing recovery are:: Rehab CMS Medicare.gov Compare Post Acute Care list provided to:: Patient Represenative (must comment) (Son) Choice offered to / list presented to : Adult Children  Expected Discharge Plan and Services Expected Discharge Plan: [place] In-house Referral: Clinical Social Work   Post Acute Care Choice: [place] Living arrangements for the past 2 months: [place]                             [place]: NA        Prior Living  Arrangements/Services Living arrangements for the past 2 months: [place] Lives with:: Self, Facility Resident Patient language and need for interpreter reviewed:: Yes Do you feel safe going back to the place where you live?: Yes      Need for Family Participation in Patient Care: Yes (Comment) Care giver support system in place?: Yes (comment) Current home services: DME Criminal Activity/Legal Involvement Pertinent to Current Situation/Hospitalization: No - Comment as needed  Activities of Daily Living Home Assistive Devices/Equipment: Other (Comment) (unknown, pt is confused) ADL Screening (condition at time of admission) Patient's cognitive ability adequate to safely complete daily activities?: No Is the patient deaf or have difficulty hearing?: Yes Does the patient have difficulty seeing, even when wearing glasses/contacts?: No Does the patient have difficulty concentrating, remembering, or making decisions?: Yes Patient able to express need for assistance with ADLs?: No Does the patient have difficulty dressing or bathing?: Yes Independently performs ADLs?: No Does the patient have difficulty walking or climbing stairs?: Yes Weakness of Legs: Both Weakness of Arms/Hands: Both  Permission Sought/Granted Permission sought to share information with : Facility Sport and exercise psychologist, Family Supports Permission granted to share information with : No  Share Information with NAME: [name]  Permission granted to share info w AGENCY: SNFs  Permission granted to share info w Relationship: Son  Permission granted to share info w Contact Information: [phone]  Emotional Assessment   Attitude/Demeanor/Rapport: Unable to Assess Affect (typically observed): Unable to Assess Orientation: : Oriented to Self, Oriented to Place Alcohol / Substance Use: Not Applicable  Psych Involvement: No (comment)  Admission diagnosis:  Disorientation [R41.0] Atrial fibrillation with  rapid ventricular response (HCC) [I48.91] AKI (acute kidney injury) ([place]) [N17.9] LFT elevation [R79.89] Lactic acidemia [E87.2] COVID-19 [U07.1] Patient Active Problem List   Diagnosis Date Noted  . Pressure injury of skin 05/17/2020  . Sepsis due to Escherichia coli (E. coli) ([place]) 05/16/2020  . Subdural hematoma ([place]) 05/16/2020  . Atrial fibrillation with RVR ([place]) 05/16/2020  . COVID-19 virus infection 05/15/2020  . COVID-19 05/15/2020  . AKI (acute kidney injury) ([place])   . Chest pain 01/10/2019  . Abdominal pain 01/10/2019  . LFT elevation 01/10/2019  . Abnormal CT of the chest 01/10/2019  . Swelling in head/neck   . CAD (coronary artery disease) 11/30/2013  . Chronic atrial fibrillation ([place]) 10/27/2013  . Chronic anticoagulation 10/27/2013  . Cardiomyopathy, ischemic- EF 35-40% echo 10/19/12 10/27/2013  . S/P CABG x 4 with atrial clip 10/21/2013  . Left main coronary artery disease 10/20/2013  . Hx of asbestosis 10/20/2013  . NSTEMI (non-ST elevated myocardial infarction) ([place]) 10/17/2013  . Essential hypertension, benign 08/21/2010  . Atrial fibrillation with rapid ventricular response on adm 10/17/13 08/21/2010   PCP:  Lajean Manes, MD Pharmacy:   [facility] [address] [place] [address] Phone: [phone] Fax: [phone]     Social Determinants of Health (SDOH) Interventions    Readmission Risk Interventions No flowsheet data found.

## 2020-05-17 NOTE — Evaluation (Signed)
Physical Therapy Evaluation Patient Details Name: [name] MRN: [id number] DOB: [birth year] Today's Date: 05/17/2020   History of Present Illness  86 year old male with past medical history for atrial fibrillation, hypertension, coronary disease status post bypass grafting, alcohol abuse who presents for altered mentation.  Patient lives in assisted living facility, was vaccinated in March 2021 for COVID-19, positive contact with infected person recently, then tested positive for COVID-19. Of note, head CT revealed small L acute to subacute SDH.    Clinical Impression  Pt admitted with above diagnosis. PTA pt resided in assist living at [facility]. He reports using SPC in his apt and an electric scooter for longer distances. Pt is a poor historian, so unsure of validity of info provided reguarding mobility. On eval, he required mod assist bed mobility, mod assist sit to stand, and mod/HHA ambulation 3' bed to recliner. He presents with unsteady gait, decreased balance, and poor activity tolerance. Pt on 2L O2 throughout session with SpO2 96%. Pt currently with functional limitations due to the deficits listed below (see PT Problem List). Pt will benefit from skilled PT to increase their independence and safety with mobility to allow discharge to the venue listed below.       Follow Up Recommendations SNF    Equipment Recommendations  Other (comment) (defer to next venue)    Recommendations for Other Services       Precautions / Restrictions Precautions Precautions: Fall      Mobility  Bed Mobility Overal bed mobility: Needs Assistance Bed Mobility: Supine to Sit     Supine to sit: Mod assist;HOB elevated     General bed mobility comments: assist with BLE and to elevate trunk, increased time, use of bed pad to scoot to EOB, +rail  Transfers Overall transfer level: Needs assistance Equipment used: 1 person hand held assist Transfers: Sit to/from Stand Sit to Stand: Mod  assist         General transfer comment: assist to power up and stabilize balance  Ambulation/Gait Ambulation/Gait assistance: Mod assist Gait Distance (Feet): 3 Feet Assistive device: 1 person hand held assist Gait Pattern/deviations: Step-through pattern;Decreased stride length     General Gait Details: very unsteady, HHA on left, increased time, assist to maintain balance, pt fatigues quickly  Stairs            Wheelchair Mobility    Modified Rankin (Stroke Patients Only)       Balance Overall balance assessment: Needs assistance Sitting-balance support: Feet supported;Single extremity supported Sitting balance-Leahy Scale: Fair     Standing balance support: Single extremity supported;During functional activity Standing balance-Leahy Scale: Poor Standing balance comment: heavy reliance on external support                             Pertinent Vitals/Pain Pain Assessment: No/denies pain    Home Living Family/patient expects to be discharged to:: Assisted living               Home Equipment: Electric scooter;Cane - single point Additional Comments: ALF at [facility]    Prior Function Level of Independence: Independent with assistive device(s)         Comments: Pt reports modified independence using cane in apt and electric scooter otherwise. Pt is poor historian. Unsure of validity of info provided.     Hand Dominance   Dominant Hand: Right    Extremity/Trunk Assessment   Upper Extremity Assessment Upper Extremity Assessment: Generalized weakness  Lower Extremity Assessment Lower Extremity Assessment: Generalized weakness    Cervical / Trunk Assessment Cervical / Trunk Assessment: Kyphotic  Communication   Communication: No difficulties  Cognition Arousal/Alertness: Awake/alert Behavior During Therapy: WFL for tasks assessed/performed Overall Cognitive Status: No family/caregiver present to determine baseline  cognitive functioning Area of Impairment: Orientation;Attention;Memory;Safety/judgement;Awareness;Problem solving                 Orientation Level: Disoriented to;Place;Time;Situation Current Attention Level: Selective Memory: Decreased short-term memory   Safety/Judgement: Decreased awareness of safety;Decreased awareness of deficits Awareness: Intellectual Problem Solving: Difficulty sequencing;Requires verbal cues;Slow processing        General Comments General comments (skin integrity, edema, etc.): Pt on 2L O2 SpO2 96%. Productive cough noted.    Exercises     Assessment/Plan    PT Assessment Patient needs continued PT services  PT Problem List Decreased strength;Decreased mobility;Decreased safety awareness;Decreased activity tolerance;Decreased cognition;Decreased balance       PT Treatment Interventions DME instruction;Therapeutic activities;Cognitive remediation;Gait training;Therapeutic exercise;Patient/family education;Balance training;Functional mobility training    PT Goals (Current goals can be found in the Care Plan section)  Acute Rehab PT Goals Patient Stated Goal: not stated PT Goal Formulation: Patient unable to participate in goal setting Time For Goal Achievement: 05/31/20 Potential to Achieve Goals: Good    Frequency Min 2X/week   Barriers to discharge        Co-evaluation               AM-PAC PT "6 Clicks" Mobility  Outcome Measure Help needed turning from your back to your side while in a flat bed without using bedrails?: A Little Help needed moving from lying on your back to sitting on the side of a flat bed without using bedrails?: A Lot Help needed moving to and from a bed to a chair (including a wheelchair)?: A Lot Help needed standing up from a chair using your arms (e.g., wheelchair or bedside chair)?: A Little Help needed to walk in hospital room?: A Lot Help needed climbing 3-5 steps with a railing? : Total 6 Click  Score: 13    End of Session Equipment Utilized During Treatment: Gait belt;Oxygen Activity Tolerance: Patient limited by fatigue Patient left: in chair;with chair alarm set;with call bell/phone within reach;with nursing/sitter in room Nurse Communication: Mobility status PT Visit Diagnosis: Unsteadiness on feet (R26.81);Muscle weakness (generalized) (M62.81);Difficulty in walking, not elsewhere classified (R26.2)    Time: 1219-7588 PT Time Calculation (min) (ACUTE ONLY): 24 min   Charges:   PT Evaluation $PT Eval Moderate Complexity: 1 Mod PT Treatments $Gait Training: 8-22 mins        Lorrin Goodell, PT  Office # [phone] Pager [phone]   Lorriane Shire 05/17/2020, 12:49 PM

## 2020-05-17 NOTE — NC FL2 (Signed)
[place] LEVEL OF CARE SCREENING TOOL     IDENTIFICATION  Patient Name: [name] Birthdate: 19-Jul-1918 Sex: male Admission Date (Current Location): 05/15/2020  [facility] and [place] Number:  Herbalist and Address:  [facility], [place] [address] [place],  [address]      Provider Number: [id number]  Attending Physician Name and Address:  Tawni Millers  Relative Name and Phone Number:  [name], son, [phone]    Current Level of Care: Hospital Recommended Level of Care: [place] Prior Approval Number:    Date Approved/Denied:   PASRR Number: [id number]  Discharge Plan: SNF    Current Diagnoses: Patient Active Problem List   Diagnosis Date Noted  . Pressure injury of skin 05/17/2020  . Sepsis due to Escherichia coli (E. coli) ([place]) 05/16/2020  . Subdural hematoma ([place]) 05/16/2020  . Atrial fibrillation with RVR ([place]) 05/16/2020  . COVID-19 virus infection 05/15/2020  . COVID-19 05/15/2020  . AKI (acute kidney injury) ([place])   . Chest pain 01/10/2019  . Abdominal pain 01/10/2019  . LFT elevation 01/10/2019  . Abnormal CT of the chest 01/10/2019  . Swelling in head/neck   . CAD (coronary artery disease) 11/30/2013  . Chronic atrial fibrillation ([place]) 10/27/2013  . Chronic anticoagulation 10/27/2013  . Cardiomyopathy, ischemic- EF 35-40% echo 10/19/12 10/27/2013  . S/P CABG x 4 with atrial clip 10/21/2013  . Left main coronary artery disease 10/20/2013  . Hx of asbestosis 10/20/2013  . NSTEMI (non-ST elevated myocardial infarction) ([address]) 10/17/2013  . Essential hypertension, benign 08/21/2010  . Atrial fibrillation with rapid ventricular response on adm 10/17/13 08/21/2010    Orientation RESPIRATION BLADDER Height & Weight     Self, Place  Normal Incontinent, External catheter Weight:   Height:     BEHAVIORAL SYMPTOMS/MOOD NEUROLOGICAL BOWEL NUTRITION STATUS      Continent Diet   AMBULATORY STATUS COMMUNICATION OF NEEDS Skin   Limited Assist Verbally PU Stage and Appropriate Care (Stage I on sacrum)                       Personal Care Assistance Level of Assistance  Bathing, Feeding, Dressing Bathing Assistance: Limited assistance Feeding assistance: Limited assistance Dressing Assistance: Limited assistance     Functional Limitations Info  Sight, Hearing, Speech Sight Info: Adequate Hearing Info: Adequate Speech Info: Adequate    SPECIAL CARE FACTORS FREQUENCY  PT (By licensed PT), OT (By licensed OT), Speech therapy     PT Frequency: 5x/week OT Frequency: 5x/week     Speech Therapy Frequency: 2x/week      Contractures Contractures Info: Not present    Additional Factors Info  Code Status, Allergies, Isolation Precautions Code Status Info: DNR Allergies Info: Norvasc (Amlodipine Besylate), Symmetrel (Amantadine), Hydrochlorothiazide     Isolation Precautions Info: COVID+     Current Medications (05/17/2020):  This is the current hospital active medication list Current Facility-Administered Medications  Medication Dose Route Frequency Provider Last Rate Last Admin  . acetaminophen (TYLENOL) tablet 650 mg  650 mg Oral Q6H PRN Wynetta Fines T, MD      . cefTRIAXone (ROCEPHIN) 2 g in sodium chloride 0.9 % 100 mL IVPB  2 g Intravenous Q24H Tawni Millers, MD 200 mL/hr at 05/17/20 0910 2 g at 05/17/20 0910  . feeding supplement (ENSURE ENLIVE) (ENSURE ENLIVE) liquid 237 mL  237 mL Oral TID BM Arrien, Jimmy Picket, MD   237 mL  at 05/17/20 1621  . folic acid (FOLVITE) tablet 1 mg  1 mg Oral Daily Wynetta Fines T, MD   1 mg at 05/17/20 0906  . guaiFENesin-dextromethorphan (ROBITUSSIN DM) 100-10 MG/5ML syrup 10 mL  10 mL Oral Q4H PRN Wynetta Fines T, MD      . ipratropium (ATROVENT HFA) inhaler 2 puff  2 puff Inhalation Q6H Lequita Halt, MD   2 puff at 05/17/20 1336  . metoprolol tartrate (LOPRESSOR) tablet 50 mg  50 mg Oral BID Arrien,  Jimmy Picket, MD      . multivitamin with minerals tablet 1 tablet  1 tablet Oral Daily Lequita Halt, MD   1 tablet at 05/17/20 [phone]  . ondansetron (ZOFRAN) tablet 4 mg  4 mg Oral Q6H PRN Wynetta Fines T, MD       Or  . ondansetron [facility]) injection 4 mg  4 mg Intravenous Q6H PRN Wynetta Fines T, MD      . remdesivir 100 mg in sodium chloride 0.9 % 100 mL IVPB  100 mg Intravenous Daily Lequita Halt, MD   Stopped at 05/17/20 1200  . thiamine tablet 100 mg  100 mg Oral Daily Wynetta Fines T, MD   100 mg at 05/17/20 0051   Facility-Administered Medications Ordered in Other Encounters  Medication Dose Route Frequency Provider Last Rate Last Admin  . casirivimab (REGN 10933) 600 mg, imdevimab (REGN 10987) 600 mg in sodium chloride 0.9 % 110 mL IVPB   Intravenous Once Mannam, Praveen, MD         Discharge Medications: Please see discharge summary for a list of discharge medications.  Relevant Imaging Results:  Relevant Lab Results:   Additional Information SSN: [id number]  [place], [place]

## 2020-05-18 LAB — CBC WITH DIFFERENTIAL/PLATELET
Abs Immature Granulocytes: 0.06 10*3/uL (ref 0.00–0.07)
Basophils Absolute: 0 10*3/uL (ref 0.0–0.1)
Basophils Relative: 0 %
Eosinophils Absolute: 0 10*3/uL (ref 0.0–0.5)
Eosinophils Relative: 0 %
HCT: 40.7 % (ref 39.0–52.0)
Hemoglobin: 13.4 g/dL (ref 13.0–17.0)
Immature Granulocytes: 1 %
Lymphocytes Relative: 15 %
Lymphs Abs: 0.7 10*3/uL (ref 0.7–4.0)
MCH: 30.5 pg (ref 26.0–34.0)
MCHC: 32.9 g/dL (ref 30.0–36.0)
MCV: 92.7 fL (ref 80.0–100.0)
Monocytes Absolute: 0.2 10*3/uL (ref 0.1–1.0)
Monocytes Relative: 3 %
Neutro Abs: 4.1 10*3/uL (ref 1.7–7.7)
Neutrophils Relative %: 81 %
Platelets: 131 10*3/uL — ABNORMAL LOW (ref 150–400)
RBC: 4.39 MIL/uL (ref 4.22–5.81)
RDW: 14.3 % (ref 11.5–15.5)
WBC: 5 10*3/uL (ref 4.0–10.5)
nRBC: 0 % (ref 0.0–0.2)

## 2020-05-18 LAB — BASIC METABOLIC PANEL
Anion gap: 11 (ref 5–15)
BUN: 39 mg/dL — ABNORMAL HIGH (ref 8–23)
CO2: 27 mmol/L (ref 22–32)
Calcium: 8.9 mg/dL (ref 8.9–10.3)
Chloride: 108 mmol/L (ref 98–111)
Creatinine, Ser: 1.27 mg/dL — ABNORMAL HIGH (ref 0.61–1.24)
GFR calc Af Amer: 54 mL/min — ABNORMAL LOW (ref >60)
GFR calc non Af Amer: 46 mL/min — ABNORMAL LOW (ref >60)
Glucose, Bld: 95 mg/dL (ref 70–99)
Potassium: 2.8 mmol/L — ABNORMAL LOW (ref 3.5–5.1)
Sodium: 146 mmol/L — ABNORMAL HIGH (ref 135–145)

## 2020-05-18 LAB — CULTURE, BLOOD (ROUTINE X 2)
Special Requests: ADEQUATE
Special Requests: ADEQUATE

## 2020-05-18 MED ORDER — POTASSIUM CHLORIDE CRYS ER 20 MEQ PO TBCR
40.0000 meq | EXTENDED_RELEASE_TABLET | ORAL | Status: AC
  Administered 2020-05-18 (×2): 40 meq via ORAL
  Filled 2020-05-18 (×2): qty 2

## 2020-05-18 MED ORDER — CEFAZOLIN SODIUM-DEXTROSE 2-4 GM/100ML-% IV SOLN
2.0000 g | Freq: Two times a day (BID) | INTRAVENOUS | Status: DC
  Administered 2020-05-18 – 2020-05-19 (×4): 2 g via INTRAVENOUS
  Filled 2020-05-18 (×5): qty 100

## 2020-05-18 MED ORDER — LORAZEPAM 0.5 MG PO TABS: 0.5000 mg | ORAL_TABLET | ORAL | Status: DC | PRN

## 2020-05-18 NOTE — Progress Notes (Signed)
PROGRESS NOTE    [name]  DJT:[id number] DOB: 1918/07/26 DOA: 05/15/2020 PCP: Lajean Manes, MD    Brief Narrative:  Patient admitted to the hospitalwith theworking diagnosis of sepsis due to E. coli bacteremia, related to the urine tract infection,in thesetting of positive COVID-35.  85 year old male with past medical history for atrial fibrillation, hypertension, coronary disease status post bypass grafting, alcohol abuse who presents for altered mentation. Patient lives in assisted living facility, been vaccinated in March 2021 for COVID-19, positive contact with infected person recently, he tested positive for COVID-19 3 days ago. Patient was scheduled toreceived monoclonal antibody the day before admission, but not able to get medication due to acute illness. On the day of admission he was noted to be confused and have labored breathing, he was transported to the hospital. On his initial physical examination his oximetry was 90 to 91% on room air,blood pressure 141/89, heart rate 133, respiratoryrate26, he had dry mucous membranes, he had increased work of breathing, positive accessory muscle use, no wheezing or rhonchi, heart S1-S2, present rhythm, soft abdomen, no lower extremity edema. Patient was confused and disoriented. Sodium 137, potassium 4.2, chloride 101, bicarb 22, glucose 138, BUN 42, creatinine 2.28, AST 370, ALT 221, troponin I 248, lactic acid 2.5, white count 18.6, hemoglobin 13.6, hematocrit 41.7, platelets 122. SARS COVID-19 positive. Urinalysis 11-20 white cells, 0-5 red cells. Specific gravity 1.016. Blood culture positive for E. Coli.CT of the head with subacute left cerebral hemisphere subdural hematoma,3 mm thickness. Acute subdural hemorrhage left frontoparietal convexity, 4 mm thickness. His chest radiograph at bilateral pleural calcifications. EKG 133 bpm, left axis deviation, left anterior fascicular block, right bundle branch block, atrial  fibrillation rhythm, positive PVC no significant ST segment or T wave changes.  Patient placed on IV antibiotic therapy, no signs viral pneumonia. He has been clinically responding well.     Assessment & Plan:   Principal Problem:   Sepsis due to Escherichia coli (E. coli) (HCC) Active Problems:   Essential hypertension, benign   Hx of asbestosis   S/P CABG x 4 with atrial clip   CAD (coronary artery disease)   LFT elevation   COVID-19 virus infection   AKI (acute kidney injury) (HCC)   Subdural hematoma (H[facility])   Atrial fibrillation with RVR (H[facility])   Pressure injury of skin   1. Severe sepsis due to E Coli bacteremia, due to urinary tract infection, end-organ failure lactic acidosis, AKI and encephalopathy.  Blood cultures #1 positive for E Coli. Troponin elevation 248-222 (no ACS).  Blood culture #2 with staphylococcus capitis, likely contamination.   Patient has remained hemodynamically stable, sepsis is resolving  E coli sensitive to 1st generation cephalosporins, will change to cefazolin IV for now.  Continue to follow up cell count, cultures and temperature curve.   2. Atrial fibrillation with rapid ventricular response. Continue rate controlled with metoprolol 50 mg po bid, no anticoagulation due to intracranial bleed.   Continue telemetry monitoring.   3. AKI on CKD stage 3a/ hypomagnesemia/ hypokalemia. (base cr 1,2. Old records personally reviewed). Serum cr today down to 1,27 with K 2,8 and serum bicarbonate at 27.   Add Kcl 40 meq x2 and follow up on renal function in am, continue to hold on IV fluids, following a restrictive IV fluids strategy.   4. Elevated liver enzymes, likely due to hypoperfusion, liver shock. Clinically resolving. Liver enzymes trending down.   5. History of alcohol abuse. this am is calm with no signs  of distress, will continue neuro checks, and add lorazepam as needed for anxiety.   6. COVID 19 infection. Patient has been fully  vaccinated, his chest film has no infiltrates. Ruled out for viral pneumonia.   Tolerating well Remdesivir #4/5, will check CMP in am.   7. Subdural hematoma. Left small. Subacute to acute. Continue to improve mentation, no headache, nausea or vomiting. Continue holding on anticoagulation for now.    On dysphagia 3 diet per speech recommendations.  Patient will be dc to SNF.   8. Sacrum stage one pressure ulcer present on admission. On  local wound care. Continue with nutritional supplements.     Status is: Inpatient  Remains inpatient appropriate because:Inpatient level of care appropriate due to severity of illness   Dispo: The patient is from: Home              Anticipated d/c is to: SNF              Anticipated d/c date is: 2 days              Patient currently is not medically stable to d/c. Plan for dc to SNF on 05/20/20, if continue to improve.     DVT prophylaxis: scd   Code Status:   dnr   Family Communication:  I spoke over the phone with the patient's son  about patient's  condition, plan of care, prognosis and all questions were addressed.     Nutrition Status: Nutrition Problem: Increased nutrient needs Etiology: acute illness (COVID) Signs/Symptoms: estimated needs Interventions: Ensure Enlive (each supplement provides 350kcal and 20 grams of protein)     Skin Documentation: Pressure Injury 05/16/20 Sacrum Stage 1 -  Intact skin with non-blanchable redness of a localized area usually over a bony prominence. red non blanching (Active)  05/16/20 0930  Location: Sacrum  Location Orientation:   Staging: Stage 1 -  Intact skin with non-blanchable redness of a localized area usually over a bony prominence.  Wound Description (Comments): red non blanching  Present on Admission: Yes       Antimicrobials:   Ceftriaxone 08/01 to 08/04  Cefazolin started 08/04    Subjective: Patient is feeling better, no anxiety or tremors, no nausea or vomiting,  continue to be very weak and deconditioned not yet back to baseline,   Objective: Vitals:   05/18/20 0130 05/18/20 0400 05/18/20 0848 05/18/20 0900  BP: (!) 131/58 (!) 136/58 129/90   Pulse: 81 82    Resp: 17 20    Temp: 97.6 F (36.4 C) (!) 97.5 F (36.4 C)    TempSrc: Oral Axillary    SpO2: 96% 96%    Weight:    58.1 kg    Intake/Output Summary (Last 24 hours) at 05/18/2020 2947 Last data filed at 05/18/2020 6546 Gross per 24 hour  Intake 139.23 ml  Output 150 ml  Net -10.77 ml   Filed Weights   05/18/20 0900  Weight: 58.1 kg    Examination:   General: deconditioned  Neurology: Awake and alert, non focal  E ENT: no pallor, no icterus, oral mucosa moist Cardiovascular: No JVD. S1-S2 present, rhythmic, no gallops, rubs, or murmurs. No lower extremity edema. Pulmonary: positive breath sounds bilaterally, adequate air movement, no wheezing, rhonchi or rales. Gastrointestinal. Abdomen soft and non tender Skin. No rashes Musculoskeletal: no joint deformities     Data Reviewed: I have personally reviewed following labs and imaging studies  CBC: Recent Labs  Lab 05/15/20 1533 05/16/20  0500 05/17/20 0500 05/18/20 0500  WBC 18.6* 12.1* 8.0 5.0  NEUTROABS 15.3* 10.0* 6.7 4.1  HGB 13.6 11.9* 12.8* 13.4  HCT 41.7 36.1* 38.2* 40.7  MCV 93.9 94.0 92.9 92.7  PLT 122* 111* 118* 599*   Basic Metabolic Panel: Recent Labs  Lab 05/15/20 1533 05/16/20 0500 05/17/20 0500 05/18/20 0500  NA 137 141 141 146*  K 4.2 3.4* 3.5 2.8*  CL 101 109 106 108  CO2 22 20* 25 27  GLUCOSE 138* 100* 112* 95  BUN 42* 42* 45* 39*  CREATININE 2.28* 1.68* 1.51* 1.27*  CALCIUM 8.1* 6.9* 8.4* 8.9  MG  --  1.9  --   --   PHOS  --  3.6  --   --    GFR: Estimated Creatinine Clearance: 26.1 mL/min (A) (by C-G formula based on SCr of 1.27 mg/dL (H)). Liver Function Tests: Recent Labs  Lab 05/15/20 1533 05/16/20 0500 05/17/20 0500  AST 370* 178* 93*  ALT 221* 137* 114*  ALKPHOS 211*  142* 135*  BILITOT 3.5* 2.0* 1.4*  PROT 6.2* 5.1* 5.8*  ALBUMIN 3.0* 2.3* 2.7*   No results for input(s): LIPASE, AMYLASE in the last 168 hours. No results for input(s): AMMONIA in the last 168 hours. Coagulation Profile: Recent Labs  Lab 05/15/20 1533  INR 1.5*   Cardiac Enzymes: Recent Labs  Lab 05/15/20 1533  CKTOTAL 150   BNP (last 3 results) No results for input(s): PROBNP in the last 8760 hours. HbA1C: No results for input(s): HGBA1C in the last 72 hours. CBG: No results for input(s): GLUCAP in the last 168 hours. Lipid Profile: No results for input(s): CHOL, HDL, LDLCALC, TRIG, CHOLHDL, LDLDIRECT in the last 72 hours. Thyroid Function Tests: No results for input(s): TSH, T4TOTAL, FREET4, T3FREE, THYROIDAB in the last 72 hours. Anemia Panel: Recent Labs    05/16/20 0500  FERRITIN 4*      Radiology Studies: I have reviewed all of the imaging during this hospital visit personally     Scheduled Meds: . feeding supplement (ENSURE ENLIVE)  237 mL Oral TID BM  . folic acid  1 mg Oral Daily  . ipratropium  2 puff Inhalation Q6H  . metoprolol tartrate  50 mg Oral BID  . multivitamin with minerals  1 tablet Oral Daily  . potassium chloride  40 mEq Oral Q4H  . thiamine  100 mg Oral Daily   Continuous Infusions: . cefTRIAXone (ROCEPHIN)  IV Stopped (05/17/20 1716)  . remdesivir 100 mg in NS 100 mL 100 mg (05/18/20 0905)     LOS: 3 days        Ashari Llewellyn Gerome Apley, MD

## 2020-05-18 NOTE — Plan of Care (Signed)
  Problem: Health Behavior/Discharge Planning: Goal: Ability to manage health-related needs will improve Outcome: Progressing   Problem: Clinical Measurements: Goal: Ability to maintain clinical measurements within normal limits will improve Outcome: Progressing Goal: Will remain free from infection Outcome: Progressing Goal: Diagnostic test results will improve Outcome: Progressing Goal: Respiratory complications will improve Outcome: Progressing Goal: Cardiovascular complication will be avoided Outcome: Progressing   Problem: Coping: Goal: Level of anxiety will decrease Outcome: Progressing   Problem: Elimination: Goal: Will not experience complications related to bowel motility Outcome: Progressing Goal: Will not experience complications related to urinary retention Outcome: Progressing   Problem: Pain Managment: Goal: General experience of comfort will improve Outcome: Progressing   Problem: Safety: Goal: Ability to remain free from injury will improve Outcome: Progressing   Problem: Education: Goal: Knowledge of risk factors and measures for prevention of condition will improve Outcome: Progressing   Problem: Coping: Goal: Psychosocial and spiritual needs will be supported Outcome: Progressing   Problem: Respiratory: Goal: Will maintain a patent airway Outcome: Progressing Goal: Complications related to the disease process, condition or treatment will be avoided or minimized Outcome: Progressing   Problem: Education: Goal: Knowledge of General Education information will improve Description: Including pain rating scale, medication(s)/side effects and non-pharmacologic comfort measures Outcome: Not Progressing   Problem: Activity: Goal: Risk for activity intolerance will decrease Outcome: Not Progressing   Problem: Nutrition: Goal: Adequate nutrition will be maintained Outcome: Not Progressing   Problem: Skin Integrity: Goal: Risk for impaired skin  integrity will decrease Outcome: Not Progressing

## 2020-05-18 NOTE — Progress Notes (Signed)
Occupational Therapy Evaluation Patient Details Name: [name] MRN: [id number] DOB: 1918-01-27 Today's Date: 05/18/2020    History of Present Illness 86 year old male with past medical history for atrial fibrillation, hypertension, coronary disease status post bypass grafting, alcohol abuse who presents for altered mentation.  Patient lives in assisted living facility, was vaccinated in March 2021 for COVID-19, positive contact with infected person recently, then tested positive for COVID-19. Of note, head CT revealed small L acute to subacute SDH.   Clinical Impression   Patient here from ALF where he reports using electric scooter for most mobility.  ADL level of assist unclear as patient poor historian and stating he "usually doesn't need help". Patient presents with decreased cognition, balance, strength, and activity tolerance.  Required mod assist with bed mobility and stand pivot to chair.  Needing help with sequencing mobility steps though patient able to follow single step commands.  Patient fearful of falling and has posterior lean in both sitting and standing.  Requiring min assist with UB ADLs and max with LB at this time.  Will continue to follow with OT acutely to address the deficits listed below.      Follow Up Recommendations  SNF;Supervision/Assistance - 24 hour    Equipment Recommendations  Other (comment) (defer to next venue)    Recommendations for Other Services       Precautions / Restrictions Precautions Precautions: Fall Restrictions Weight Bearing Restrictions: No      Mobility Bed Mobility Overal bed mobility: Needs Assistance Bed Mobility: Supine to Sit     Supine to sit: Mod assist;HOB elevated        Transfers Overall transfer level: Needs assistance Equipment used: 2 person hand held assist Transfers: Sit to/from [employer] Transfers Sit to Stand: Mod assist Stand pivot transfers: Mod assist            Balance Overall  balance assessment: Needs assistance Sitting-balance support: Feet supported;Single extremity supported Sitting balance-Leahy Scale: Poor Sitting balance - Comments: Patient relying on at least single extremity support and min assist of therapist Postural control: Posterior lean Standing balance support: Bilateral upper extremity supported;During functional activity Standing balance-Leahy Scale: Poor Standing balance comment: heavy reliance on external support                           ADL either performed or assessed with clinical judgement   ADL Overall ADL's : Needs assistance/impaired Eating/Feeding: Set up;Sitting   Grooming: Minimal assistance;Sitting   Upper Body Bathing: Minimal assistance;Sitting   Lower Body Bathing: Maximal assistance;Sitting/lateral leans   Upper Body Dressing : Minimal assistance;Sitting   Lower Body Dressing: Maximal assistance;Sitting/lateral leans   Toilet Transfer: Moderate assistance;Stand-pivot;BSC   Toileting- Clothing Manipulation and Hygiene: Maximal assistance;Sitting/lateral lean       Functional mobility during ADLs: Moderate assistance;Rolling walker       Vision         Perception     Praxis      Pertinent Vitals/Pain Pain Assessment: Faces Faces Pain Scale: Hurts a little bit Pain Location: Stated he had some discomfort with laying in bed Pain Descriptors / Indicators: Discomfort Pain Intervention(s): Repositioned     Hand Dominance Right   Extremity/Trunk Assessment Upper Extremity Assessment Upper Extremity Assessment: Generalized weakness   Lower Extremity Assessment Lower Extremity Assessment: Generalized weakness   Cervical / Trunk Assessment Cervical / Trunk Assessment: Kyphotic   Communication Communication Communication: HOH   Cognition Arousal/Alertness: Awake/alert Behavior During  Therapy: WFL for tasks assessed/performed Overall Cognitive Status: No family/caregiver present to  determine baseline cognitive functioning Area of Impairment: Orientation;Attention;Memory;Safety/judgement;Awareness;Problem solving;Following commands                 Orientation Level: Disoriented to;Place;Time;Situation Current Attention Level: Selective Memory: Decreased short-term memory Following Commands: Follows one step commands consistently;Follows one step commands with increased time Safety/Judgement: Decreased awareness of safety;Decreased awareness of deficits Awareness: Intellectual Problem Solving: Difficulty sequencing;Requires verbal cues;Slow processing;Decreased initiation General Comments: Patient able to follow one step commands though with increasedtime.  Not oriented and poor STM.  Difficulty sequencing mobility steps    General Comments  RA and SpO2 >96 throughout session.    Exercises     Shoulder Instructions      Home Living Family/patient expects to be discharged to:: Assisted living                             Home Equipment: Electric scooter;Cane - single point   Additional Comments: ALF at [facility]      Prior Functioning/Environment Level of Independence: Independent with assistive device(s)        Comments: Reports he has cane but doesn't like to use it, uses electric scooter most often.        OT Problem List: Decreased strength;Decreased activity tolerance;Impaired balance (sitting and/or standing);Decreased cognition;Decreased safety awareness      OT Treatment/Interventions: Self-care/ADL training;Therapeutic exercise;Therapeutic activities;Cognitive remediation/compensation;Patient/family education;Balance training;Energy conservation    OT Goals(Current goals can be found in the care plan section) Acute Rehab OT Goals Patient Stated Goal: to get more comfortable OT Goal Formulation: With patient Time For Goal Achievement: 06/01/20 Potential to Achieve Goals: Good  OT Frequency: Min 2X/week   Barriers to D/C:             Co-evaluation              AM-PAC OT "6 Clicks" Daily Activity     Outcome Measure Help from another person eating meals?: A Little Help from another person taking care of personal grooming?: A Little Help from another person toileting, which includes using toliet, bedpan, or urinal?: A Lot Help from another person bathing (including washing, rinsing, drying)?: A Lot Help from another person to put on and taking off regular upper body clothing?: A Little Help from another person to put on and taking off regular lower body clothing?: A Lot 6 Click Score: 15   End of Session Equipment Utilized During Treatment: Gait belt Nurse Communication: Mobility status  Activity Tolerance: Patient tolerated treatment well Patient left: in chair;with call bell/phone within reach;with chair alarm set  OT Visit Diagnosis: Unsteadiness on feet (R26.81);Muscle weakness (generalized) (M62.81);Other symptoms and signs involving cognitive function                Time: 1047-1110 OT Time Calculation (min): 23 min Charges:  OT General Charges $OT Visit: 1 Visit OT Evaluation $OT Eval Moderate Complexity: 1 Mod OT Treatments $Self Care/Home Management : 8-22 mins  [name], OTR/L  [name] 05/18/2020, 11:19 AM

## 2020-05-19 LAB — COMPREHENSIVE METABOLIC PANEL
ALT: 52 U/L — ABNORMAL HIGH (ref 0–44)
AST: 38 U/L (ref 15–41)
Albumin: 2.9 g/dL — ABNORMAL LOW (ref 3.5–5.0)
Alkaline Phosphatase: 98 U/L (ref 38–126)
Anion gap: 13 (ref 5–15)
BUN: 33 mg/dL — ABNORMAL HIGH (ref 8–23)
CO2: 23 mmol/L (ref 22–32)
Calcium: 8.9 mg/dL (ref 8.9–10.3)
Chloride: 108 mmol/L (ref 98–111)
Creatinine, Ser: 1.16 mg/dL (ref 0.61–1.24)
GFR calc Af Amer: 60 mL/min — ABNORMAL LOW (ref >60)
GFR calc non Af Amer: 52 mL/min — ABNORMAL LOW (ref >60)
Glucose, Bld: 98 mg/dL (ref 70–99)
Potassium: 4.1 mmol/L (ref 3.5–5.1)
Sodium: 144 mmol/L (ref 135–145)
Total Bilirubin: 1.2 mg/dL (ref 0.3–1.2)
Total Protein: 6.1 g/dL — ABNORMAL LOW (ref 6.5–8.1)

## 2020-05-19 LAB — CBC WITH DIFFERENTIAL/PLATELET
Abs Immature Granulocytes: 0.06 10*3/uL (ref 0.00–0.07)
Basophils Absolute: 0 10*3/uL (ref 0.0–0.1)
Basophils Relative: 0 %
Eosinophils Absolute: 0 10*3/uL (ref 0.0–0.5)
Eosinophils Relative: 0 %
HCT: 43.5 % (ref 39.0–52.0)
Hemoglobin: 14.3 g/dL (ref 13.0–17.0)
Immature Granulocytes: 1 %
Lymphocytes Relative: 18 %
Lymphs Abs: 0.9 10*3/uL (ref 0.7–4.0)
MCH: 30.9 pg (ref 26.0–34.0)
MCHC: 32.9 g/dL (ref 30.0–36.0)
MCV: 94 fL (ref 80.0–100.0)
Monocytes Absolute: 0.3 10*3/uL (ref 0.1–1.0)
Monocytes Relative: 6 %
Neutro Abs: 3.5 10*3/uL (ref 1.7–7.7)
Neutrophils Relative %: 75 %
Platelets: 143 10*3/uL — ABNORMAL LOW (ref 150–400)
RBC: 4.63 MIL/uL (ref 4.22–5.81)
RDW: 14.7 % (ref 11.5–15.5)
WBC: 4.8 10*3/uL (ref 4.0–10.5)
nRBC: 0 % (ref 0.0–0.2)

## 2020-05-19 NOTE — Progress Notes (Addendum)
PROGRESS NOTE    [name]  EKC:[id number] DOB: 1918/08/12 DOA: 05/15/2020 PCP: Lajean Manes, MD    Brief Narrative:  Patient admitted to the hospitalwith theworking diagnosis of sepsis due to E. coli bacteremia, related to the urine tract infection,in thesetting of positive COVID-57.  86 year old male with past medical history for atrial fibrillation, hypertension, coronary disease status post bypass grafting, alcohol abuse who presents for altered mentation. Patient lives in assisted living facility, been vaccinated in March 2021 for COVID-19, positive contact with infected person recently, he tested positive for COVID-19 3 days ago. Patient was scheduled toreceived monoclonal antibody the day before admission, but not able to get medication due to acute illness. On the day of admission he was noted to be confused and have labored breathing, he was transported to the hospital. On his initial physical examination his oximetry was 90 to 91% on room air,blood pressure 141/89, heart rate 133, respiratoryrate26, he had dry mucous membranes, he had increased work of breathing, positive accessory muscle use, no wheezing or rhonchi, heart S1-S2, present rhythm, soft abdomen, no lower extremity edema. Patient was confused and disoriented. Sodium 137, potassium 4.2, chloride 101, bicarb 22, glucose 138, BUN 42, creatinine 2.28, AST 370, ALT 221, troponin I 248, lactic acid 2.5, white count 18.6, hemoglobin 13.6, hematocrit 41.7, platelets 122. SARS COVID-19 positive. Urinalysis 11-20 white cells, 0-5 red cells. Specific gravity 1.016. Blood culture positive for E. Coli.CT of the head with subacute left cerebral hemisphere subdural hematoma,3 mm thickness. Acute subdural hemorrhage left frontoparietal convexity, 4 mm thickness. His chest radiograph at bilateral pleural calcifications. EKG 133 bpm, left axis deviation, left anterior fascicular block, right bundle branch block, atrial  fibrillation rhythm, positive PVC no significant ST segment or T wave changes.  Patient placed on IV antibiotic therapy, no signs viral pneumonia. He has been clinically responding well.   Assessment & Plan:   Principal Problem:   Sepsis due to Escherichia coli (E. coli) (HCC) Active Problems:   Essential hypertension, benign   Hx of asbestosis   S/P CABG x 4 with atrial clip   CAD (coronary artery disease)   LFT elevation   COVID-19 virus infection   AKI (acute kidney injury) (HCC)   Subdural hematoma (HCC)   Atrial fibrillation with RVR (HCC)   Pressure injury of skin   1. Severe sepsis due to E Coli bacteremia, due to urinary tract infection, end-organ failure lactic acidosis, AKI and encephalopathy. Blood cultures #1positive for E Coli. Troponin elevation 248-222 (no ACS). Blood culture #2 with staphylococcus capitis, likely contamination.  WBC is 4,8.   Clinically continue to improve. Continue antibiotic therapy with cefazolin IV with good toleration.     2. Atrial fibrillation with rapid ventricular response.Tolerating well increased dose of metoprolol to 50 mg po bid. Continue to hold on anticoagulation due to intracranial bleed.   Keep on telemetry monitoring for now.   3. AKI on CKD stage 3a/ hypomagnesemia/ hypokalemia. (base cr 1,2. Old records personally reviewed).Continue to improve renal function with serum cr down to 1,16, K is up to 4,1 and serum bicarbonate at 23.  Continue to encourage po intake.   4. Elevated liver enzymes, likely due to hypoperfusion, liver shock. AST is down to 38 and ALT 52.   5. History of alcohol abuse. On as needed po lorazepam, has no required any dosing yet. He continue with no clinical signs of withdrawal.   6. COVID 19 infection. Patient has been fully vaccinated, his chest film  has no infiltrates.Ruled out for viral pneumonia.   Continue with Remdesivir #5/5, no indication for steroids.    7. Subdural  hematoma. Left small. Subacute to acute. holding on anticoagulation.   Continue with dysphagia 3 diet per speech recommendations. Patient will be dc to SNF.   8. Sacrum stage one pressure ulcer present on admission.Continue with local wound care. Continue with nutritional supplements.     Status is: Inpatient  Remains inpatient appropriate because:IV treatments appropriate due to intensity of illness or inability to take PO   Dispo: The patient is from: Home              Anticipated d/c is to: SNF              Anticipated d/c date is: 1 day              Patient currently is not medically stable to d/c.  DVT prophylaxis: scd   Code Status:   dnr   Family Communication:  I spoke over the phone with the patient's son about patient's  condition, plan of care, prognosis and all questions were addressed.   Nutrition Status: Nutrition Problem: Increased nutrient needs Etiology: acute illness (COVID) Signs/Symptoms: estimated needs Interventions: Ensure Enlive (each supplement provides 350kcal and 20 grams of protein)     Skin Documentation: Pressure Injury 05/16/20 Sacrum Stage 1 -  Intact skin with non-blanchable redness of a localized area usually over a bony prominence. red non blanching (Active)  05/16/20 0930  Location: Sacrum  Location Orientation:   Staging: Stage 1 -  Intact skin with non-blanchable redness of a localized area usually over a bony prominence.  Wound Description (Comments): red non blanching  Present on Admission: Yes       Antimicrobials:   Ceftriaxone dc  Cefazolin     Subjective: Patient is feeling better, no dyspnea or chest pain, continue to be very weak and deconditioned   Objective: Vitals:   05/18/20 2015 05/19/20 0000 05/19/20 0400 05/19/20 0800  BP: (!) 155/105 (!) 146/91 (!) 156/97 (!) 130/92  Pulse: (!) 103 100 94 (!) 104  Resp: 16 14 19 18   Temp: 97.6 F (36.4 C) 97.7 F (36.5 C) 98 F (36.7 C) 98.3 F (36.8 C)    TempSrc: Oral Axillary Oral Oral  SpO2: 98% 98% 98% 97%  Weight:        Intake/Output Summary (Last 24 hours) at 05/19/2020 5621 Last data filed at 05/19/2020 3086 Gross per 24 hour  Intake 620.24 ml  Output 800 ml  Net -179.76 ml   Filed Weights   05/18/20 0900  Weight: 58.1 kg    Examination:   General: Not in pain or dyspnea  Neurology: Awake and alert, non focal  E ENT: no pallor, no icterus, oral mucosa moist Cardiovascular: No JVD. S1-S2 present, rhythmic, no gallops, rubs, or murmurs. No lower extremity edema. Pulmonary: positive breath sounds bilaterally,  Gastrointestinal. Abdomen soft and non tender Skin. No rashes Musculoskeletal: no joint deformities     Data Reviewed: I have personally reviewed following labs and imaging studies  CBC: Recent Labs  Lab 05/15/20 1533 05/16/20 0500 05/17/20 0500 05/18/20 0500 05/19/20 0429  WBC 18.6* 12.1* 8.0 5.0 4.8  NEUTROABS 15.3* 10.0* 6.7 4.1 3.5  HGB 13.6 11.9* 12.8* 13.4 14.3  HCT 41.7 36.1* 38.2* 40.7 43.5  MCV 93.9 94.0 92.9 92.7 94.0  PLT 122* 111* 118* 131* 578*   Basic Metabolic Panel: Recent Labs  Lab 05/15/20 1533 05/16/20  0500 05/17/20 0500 05/18/20 0500 05/19/20 0429  NA 137 141 141 146* 144  K 4.2 3.4* 3.5 2.8* 4.1  CL 101 109 106 108 108  CO2 22 20* 25 27 23   GLUCOSE 138* 100* 112* 95 98  BUN 42* 42* 45* 39* 33*  CREATININE 2.28* 1.68* 1.51* 1.27* 1.16  CALCIUM 8.1* 6.9* 8.4* 8.9 8.9  MG  --  1.9  --   --   --   PHOS  --  3.6  --   --   --    GFR: Estimated Creatinine Clearance: 28.5 mL/min (by C-G formula based on SCr of 1.16 mg/dL). Liver Function Tests: Recent Labs  Lab 05/15/20 1533 05/16/20 0500 05/17/20 0500 05/19/20 0429  AST 370* 178* 93* 38  ALT 221* 137* 114* 52*  ALKPHOS 211* 142* 135* 98  BILITOT 3.5* 2.0* 1.4* 1.2  PROT 6.2* 5.1* 5.8* 6.1*  ALBUMIN 3.0* 2.3* 2.7* 2.9*   No results for input(s): LIPASE, AMYLASE in the last 168 hours. No results for input(s):  AMMONIA in the last 168 hours. Coagulation Profile: Recent Labs  Lab 05/15/20 1533  INR 1.5*   Cardiac Enzymes: Recent Labs  Lab 05/15/20 1533  CKTOTAL 150   BNP (last 3 results) No results for input(s): PROBNP in the last 8760 hours. HbA1C: No results for input(s): HGBA1C in the last 72 hours. CBG: No results for input(s): GLUCAP in the last 168 hours. Lipid Profile: No results for input(s): CHOL, HDL, LDLCALC, TRIG, CHOLHDL, LDLDIRECT in the last 72 hours. Thyroid Function Tests: No results for input(s): TSH, T4TOTAL, FREET4, T3FREE, THYROIDAB in the last 72 hours. Anemia Panel: No results for input(s): VITAMINB12, FOLATE, FERRITIN, TIBC, IRON, RETICCTPCT in the last 72 hours.    Radiology Studies: I have reviewed all of the imaging during this hospital visit personally     Scheduled Meds: . feeding supplement (ENSURE ENLIVE)  237 mL Oral TID BM  . folic acid  1 mg Oral Daily  . ipratropium  2 puff Inhalation Q6H  . metoprolol tartrate  50 mg Oral BID  . multivitamin with minerals  1 tablet Oral Daily  . thiamine  100 mg Oral Daily   Continuous Infusions: .  ceFAZolin (ANCEF) IV Stopped (05/18/20 2138)  . remdesivir 100 mg in NS 100 mL 100 mg (05/18/20 0905)     LOS: 4 days        Selam Pietsch Gerome Apley, MD

## 2020-05-19 NOTE — Plan of Care (Signed)
°  Problem: Health Behavior/Discharge Planning: Goal: Ability to manage health-related needs will improve Outcome: Progressing   Problem: Clinical Measurements: Goal: Ability to maintain clinical measurements within normal limits will improve Outcome: Progressing Goal: Will remain free from infection Outcome: Progressing Goal: Diagnostic test results will improve Outcome: Progressing Goal: Respiratory complications will improve Outcome: Progressing Goal: Cardiovascular complication will be avoided Outcome: Progressing   Problem: Coping: Goal: Level of anxiety will decrease Outcome: Progressing   Problem: Elimination: Goal: Will not experience complications related to bowel motility Outcome: Progressing Goal: Will not experience complications related to urinary retention Outcome: Progressing   Problem: Coping: Goal: Psychosocial and spiritual needs will be supported Outcome: Progressing   Problem: Respiratory: Goal: Will maintain a patent airway Outcome: Progressing   Problem: Education: Goal: Knowledge of General Education information will improve Description: Including pain rating scale, medication(s)/side effects and non-pharmacologic comfort measures Outcome: Not Progressing   Problem: Activity: Goal: Risk for activity intolerance will decrease Outcome: Not Progressing   Problem: Nutrition: Goal: Adequate nutrition will be maintained Outcome: Not Progressing   Problem: Education: Goal: Knowledge of risk factors and measures for prevention of condition will improve Outcome: Not Progressing

## 2020-05-19 NOTE — Progress Notes (Signed)
   05/19/20 1017  Family/Significant Other Communication  Family/Significant Other Update Called;Other (Comment) (son not available)

## 2020-05-19 NOTE — TOC Progression Note (Signed)
Transition of Care [facility]) - Progression Note    Patient Details  Name: ERROLL WILBOURNE MRN: [id number] Date of Birth: 26-Mar-1918  Transition of Care [facility]) CM/SW [place], LCSW Phone Number: 05/19/2020, 3:45 PM  Clinical Narrative:    Insurance approval received for patient to discharge to [facility] tomorrow: #[id number], next review 8/9. CSW made patient's son aware of plan.    Expected Discharge Plan: [place] Barriers to Discharge: Continued Medical Work up, Ship broker, SNF Pending bed offer  Expected Discharge Plan and Services Expected Discharge Plan: [place] In-house Referral: Clinical Social Work   Post Acute Care Choice: [place] Living arrangements for the past 2 months: [place]: NA         Social Determinants of Health (SDOH) Interventions    Readmission Risk Interventions No flowsheet data found.

## 2020-05-20 DIAGNOSIS — I2510 Atherosclerotic heart disease of native coronary artery without angina pectoris: Secondary | ICD-10-CM | POA: Diagnosis not present

## 2020-05-20 DIAGNOSIS — Z955 Presence of coronary angioplasty implant and graft: Secondary | ICD-10-CM | POA: Diagnosis not present

## 2020-05-20 DIAGNOSIS — R1312 Dysphagia, oropharyngeal phase: Secondary | ICD-10-CM | POA: Diagnosis not present

## 2020-05-20 DIAGNOSIS — A4151 Sepsis due to Escherichia coli [E. coli]: Secondary | ICD-10-CM | POA: Diagnosis not present

## 2020-05-20 DIAGNOSIS — U071 COVID-19: Secondary | ICD-10-CM | POA: Diagnosis not present

## 2020-05-20 DIAGNOSIS — R339 Retention of urine, unspecified: Secondary | ICD-10-CM | POA: Diagnosis not present

## 2020-05-20 DIAGNOSIS — I482 Chronic atrial fibrillation, unspecified: Secondary | ICD-10-CM | POA: Diagnosis not present

## 2020-05-20 DIAGNOSIS — R2689 Other abnormalities of gait and mobility: Secondary | ICD-10-CM | POA: Diagnosis not present

## 2020-05-20 DIAGNOSIS — R809 Proteinuria, unspecified: Secondary | ICD-10-CM | POA: Diagnosis present

## 2020-05-20 DIAGNOSIS — M898X9 Other specified disorders of bone, unspecified site: Secondary | ICD-10-CM | POA: Diagnosis present

## 2020-05-20 DIAGNOSIS — R2681 Unsteadiness on feet: Secondary | ICD-10-CM | POA: Diagnosis not present

## 2020-05-20 DIAGNOSIS — K7200 Acute and subacute hepatic failure without coma: Secondary | ICD-10-CM | POA: Diagnosis not present

## 2020-05-20 DIAGNOSIS — R41841 Cognitive communication deficit: Secondary | ICD-10-CM | POA: Diagnosis not present

## 2020-05-20 DIAGNOSIS — Z7401 Bed confinement status: Secondary | ICD-10-CM | POA: Diagnosis not present

## 2020-05-20 DIAGNOSIS — M6281 Muscle weakness (generalized): Secondary | ICD-10-CM | POA: Diagnosis not present

## 2020-05-20 DIAGNOSIS — R4182 Altered mental status, unspecified: Secondary | ICD-10-CM | POA: Diagnosis not present

## 2020-05-20 DIAGNOSIS — R54 Age-related physical debility: Secondary | ICD-10-CM | POA: Diagnosis not present

## 2020-05-20 DIAGNOSIS — I4821 Permanent atrial fibrillation: Secondary | ICD-10-CM | POA: Diagnosis not present

## 2020-05-20 DIAGNOSIS — R1084 Generalized abdominal pain: Secondary | ICD-10-CM | POA: Diagnosis not present

## 2020-05-20 DIAGNOSIS — R5381 Other malaise: Secondary | ICD-10-CM | POA: Diagnosis not present

## 2020-05-20 DIAGNOSIS — Z66 Do not resuscitate: Secondary | ICD-10-CM | POA: Diagnosis not present

## 2020-05-20 DIAGNOSIS — E870 Hyperosmolality and hypernatremia: Secondary | ICD-10-CM | POA: Diagnosis not present

## 2020-05-20 DIAGNOSIS — N179 Acute kidney failure, unspecified: Secondary | ICD-10-CM | POA: Diagnosis not present

## 2020-05-20 DIAGNOSIS — R823 Hemoglobinuria: Secondary | ICD-10-CM | POA: Diagnosis present

## 2020-05-20 DIAGNOSIS — I6203 Nontraumatic chronic subdural hemorrhage: Secondary | ICD-10-CM | POA: Diagnosis not present

## 2020-05-20 DIAGNOSIS — N183 Chronic kidney disease, stage 3 unspecified: Secondary | ICD-10-CM | POA: Diagnosis not present

## 2020-05-20 DIAGNOSIS — R410 Disorientation, unspecified: Secondary | ICD-10-CM | POA: Diagnosis not present

## 2020-05-20 DIAGNOSIS — N390 Urinary tract infection, site not specified: Secondary | ICD-10-CM | POA: Diagnosis not present

## 2020-05-20 DIAGNOSIS — Z77090 Contact with and (suspected) exposure to asbestos: Secondary | ICD-10-CM | POA: Diagnosis present

## 2020-05-20 DIAGNOSIS — J929 Pleural plaque without asbestos: Secondary | ICD-10-CM | POA: Diagnosis not present

## 2020-05-20 DIAGNOSIS — E860 Dehydration: Secondary | ICD-10-CM | POA: Diagnosis not present

## 2020-05-20 DIAGNOSIS — R109 Unspecified abdominal pain: Secondary | ICD-10-CM | POA: Diagnosis not present

## 2020-05-20 DIAGNOSIS — M2550 Pain in unspecified joint: Secondary | ICD-10-CM | POA: Diagnosis not present

## 2020-05-20 DIAGNOSIS — I7090 Unspecified atherosclerosis: Secondary | ICD-10-CM | POA: Diagnosis not present

## 2020-05-20 DIAGNOSIS — Z79899 Other long term (current) drug therapy: Secondary | ICD-10-CM | POA: Diagnosis not present

## 2020-05-20 DIAGNOSIS — R918 Other nonspecific abnormal finding of lung field: Secondary | ICD-10-CM | POA: Diagnosis not present

## 2020-05-20 DIAGNOSIS — Z87891 Personal history of nicotine dependence: Secondary | ICD-10-CM | POA: Diagnosis not present

## 2020-05-20 DIAGNOSIS — K8020 Calculus of gallbladder without cholecystitis without obstruction: Secondary | ICD-10-CM | POA: Diagnosis not present

## 2020-05-20 DIAGNOSIS — K5730 Diverticulosis of large intestine without perforation or abscess without bleeding: Secondary | ICD-10-CM | POA: Diagnosis not present

## 2020-05-20 DIAGNOSIS — I6782 Cerebral ischemia: Secondary | ICD-10-CM | POA: Diagnosis not present

## 2020-05-20 DIAGNOSIS — G319 Degenerative disease of nervous system, unspecified: Secondary | ICD-10-CM | POA: Diagnosis not present

## 2020-05-20 DIAGNOSIS — G9341 Metabolic encephalopathy: Secondary | ICD-10-CM | POA: Diagnosis not present

## 2020-05-20 DIAGNOSIS — Z951 Presence of aortocoronary bypass graft: Secondary | ICD-10-CM | POA: Diagnosis not present

## 2020-05-20 DIAGNOSIS — I700 Atherosclerosis of aorta: Secondary | ICD-10-CM | POA: Diagnosis not present

## 2020-05-20 DIAGNOSIS — L89151 Pressure ulcer of sacral region, stage 1: Secondary | ICD-10-CM | POA: Diagnosis not present

## 2020-05-20 DIAGNOSIS — I4891 Unspecified atrial fibrillation: Secondary | ICD-10-CM | POA: Diagnosis not present

## 2020-05-20 DIAGNOSIS — N1831 Chronic kidney disease, stage 3a: Secondary | ICD-10-CM | POA: Diagnosis not present

## 2020-05-20 DIAGNOSIS — R278 Other lack of coordination: Secondary | ICD-10-CM | POA: Diagnosis not present

## 2020-05-20 DIAGNOSIS — N320 Bladder-neck obstruction: Secondary | ICD-10-CM | POA: Diagnosis not present

## 2020-05-20 DIAGNOSIS — I129 Hypertensive chronic kidney disease with stage 1 through stage 4 chronic kidney disease, or unspecified chronic kidney disease: Secondary | ICD-10-CM | POA: Diagnosis not present

## 2020-05-20 DIAGNOSIS — N3289 Other specified disorders of bladder: Secondary | ICD-10-CM | POA: Diagnosis not present

## 2020-05-20 DIAGNOSIS — Z888 Allergy status to other drugs, medicaments and biological substances status: Secondary | ICD-10-CM | POA: Diagnosis not present

## 2020-05-20 DIAGNOSIS — E861 Hypovolemia: Secondary | ICD-10-CM | POA: Diagnosis not present

## 2020-05-20 DIAGNOSIS — R404 Transient alteration of awareness: Secondary | ICD-10-CM | POA: Diagnosis not present

## 2020-05-20 DIAGNOSIS — Z8616 Personal history of COVID-19: Secondary | ICD-10-CM | POA: Diagnosis not present

## 2020-05-20 DIAGNOSIS — S065X0D Traumatic subdural hemorrhage without loss of consciousness, subsequent encounter: Secondary | ICD-10-CM | POA: Diagnosis not present

## 2020-05-20 MED ORDER — CEPHALEXIN 500 MG PO CAPS: 500.0000 mg | ORAL_CAPSULE | Freq: Two times a day (BID) | ORAL | 0 refills | Status: AC

## 2020-05-20 MED ORDER — ENSURE ENLIVE PO LIQD: 237.0000 mL | Freq: Three times a day (TID) | ORAL | 0 refills | Status: AC

## 2020-05-20 MED ORDER — FUROSEMIDE 40 MG PO TABS
40.0000 mg | ORAL_TABLET | Freq: Every day | ORAL | 0 refills | Status: DC | PRN
Start: 2020-05-20 — End: 2020-07-30

## 2020-05-20 MED ORDER — CEPHALEXIN 500 MG PO CAPS
500.0000 mg | ORAL_CAPSULE | Freq: Two times a day (BID) | ORAL | Status: DC
  Administered 2020-05-20: 500 mg via ORAL
  Filled 2020-05-20: qty 1

## 2020-05-20 MED ORDER — ACETAMINOPHEN 325 MG PO TABS: 650.0000 mg | ORAL_TABLET | Freq: Four times a day (QID) | ORAL | 0 refills | Status: DC | PRN

## 2020-05-20 MED ORDER — METOPROLOL TARTRATE 50 MG PO TABS: 50.0000 mg | ORAL_TABLET | Freq: Two times a day (BID) | ORAL | 0 refills | Status: DC

## 2020-05-20 NOTE — Progress Notes (Signed)
[name] to be D/C'd Skilled nursing facility per MD order.  Discussed with the patient and all questions fully answered.  VSS, Skin clean, dry and intact without evidence of skin break down, no evidence of skin tears noted. IV catheter discontinued intact. Site without signs and symptoms of complications. Dressing and pressure applied.  An After Visit Summary was printed and given to PTAR. Report given to [place]. Pt transported via PTAR.  Jeanella Craze 05/20/2020 2:53 PM

## 2020-05-20 NOTE — Progress Notes (Signed)
  Speech Language Pathology Treatment: Dysphagia  Patient Details Name: [name] MRN: [id number] DOB: 1918/03/05 Today's Date: 05/20/2020 Time: 3818-2993 SLP Time Calculation (min) (ACUTE ONLY): 10 min  Assessment / Plan / Recommendation Clinical Impression  Pt is D/Cing today to SNF for rehab.  Oxygenating well on room air; alert and participatory.  He continues to cough consistently and immediately after consumption of thin liquids.  Postural adjustments (chin tuck/head turn) at bedside, attempted in an effort to reduce symptoms, were not beneficial.  Nectar thick liquids continue to be tolerated much better than thins, with ongoing multiple sub-swallows, but no coughing.  It is likely best to be D/Cd on current diet - dysphagia 3/nectars- with SNF SLP f/u.  If symptoms of aspiration do not improve, pt/family could consider an OP MBS in 10-14 days.     HPI HPI: 86 year old male with past medical history for atrial fibrillation, hypertension, coronary disease status post bypass grafting, alcohol abuse who presents for altered mentation.  Patient lives in assisted living facility, was vaccinated in March 2021 for COVID-19, positive contact with infected person recently, then tested positive for COVID-19.  Admitted to [facility] 05/15/20 with working dx of sepsis secondary to UTI in setting of COVID-19.       SLP Plan  Discharge SLP treatment due to (comment) (D/C to SNF)       Recommendations  Diet recommendations: Dysphagia 3 (mechanical soft);Nectar-thick liquid Liquids provided via: Cup;Straw Medication Administration: Crushed with puree Supervision: Staff to assist with self feeding;Patient able to self feed Compensations: Minimize environmental distractions Postural Changes and/or Swallow Maneuvers: Seated upright 90 degrees;Upright 30-60 min after meal                Oral Care Recommendations: Oral care BID Follow up Recommendations: Skilled Nursing facility SLP Visit Diagnosis:  Dysphagia, unspecified (R13.10) Plan: Discharge SLP treatment due to (comment) (D/C to SNF)       GO               [name] L. Tivis Ringer, [place] Office number [phone] Pager [phone]  [name] 05/20/2020, 1:39 PM

## 2020-05-20 NOTE — Discharge Summary (Signed)
Physician Discharge Summary  Renne Platts [place] ZHG:[id number] DOB: Mar 02, 1918 DOA: 05/15/2020  PCP: Lajean Manes, MD  Admit date: 05/15/2020 Discharge date: 05/20/2020  Admitted From: Home  Disposition:  SNF  Recommendations for Outpatient Follow-up and new medication changes:  1. Follow up with Dr. Felipa Eth in 2 weeks.  2. Continue antibiotic therapy with cephalexin for 5 more days. 3. Please follow-up head CT within 2 weeks.  4. Changed furosemide to as needed in case of lower extremity edema, dyspnea or increase weight 3 lbs in 48 H or 5 lbs in 5 days.   Home Health: na   Equipment/Devices: na   Discharge Condition: stable  CODE STATUS: dnr   Diet recommendation: dysphagia 3  Brief/Interim Summary: Patient admitted to the hospitalwith theworking diagnosis of sepsis due to E. coli bacteremia, related to the urinary tract infection,in thesetting of positive COVID-22.  86 year old male with past medical history for atrial fibrillation, hypertension, coronary disease status post bypass grafting, alcohol abuse who presents for altered mentation. Patient lives in assisted living facility, been vaccinated in March 2021 for COVID-19, positive contact with infected person recently, he tested positive for COVID-19, 3 days before admission. Patient was scheduled toreceived monoclonal antibody the day before admission, but not able to get medication due to acute illness. On the day of admission he was noted to be confused and have labored breathing, he was transported to the hospital. On his initial physical examination his oximetry was 90 to 91% on room air,blood pressure 141/89, heart rate 133, respiratoryrate26, he had dry mucous membranes, he had increased work of breathing, positive accessory muscle use, no wheezing or rhonchi, heart S1-S2, present rhythm, soft abdomen, no lower extremity edema. Patient was confused and disoriented. Sodium 137, potassium 4.2, chloride 101, bicarb 22,  glucose 138, BUN 42, creatinine 2.28, AST 370, ALT 221, troponin I 248, lactic acid 2.5, white count 18.6, hemoglobin 13.6, hematocrit 41.7, platelets 122. SARS COVID-19 positive. Urinalysis 11-20 white cells, 0-5 red cells. Specific gravity 1.016. Blood culture positive for E. Coli.CT of the head with subacute left cerebral hemisphere subdural hematoma,3 mm thickness. Acute subdural hemorrhage left frontoparietal convexity, 4 mm thickness. His chest radiograph had bilateral pleural calcifications. EKG 133 bpm, left axis deviation, left anterior fascicular block, right bundle branch block, atrial fibrillation rhythm, positive PVC no significant ST segment or T wave changes.  Patient placed on IV antibiotic therapy for bacteremia with good response. He did not had any signs viral pneumonia.   1.  Severe sepsis due to E. coli bacteremia, due to urinary tract infection, endorgan failure lactic acidosis, acute kidney injury and encephalopathy. (Present on admission).  Patient was admitted to the medical [facility], he was placed on a telemetry monitor.  He received antibiotic therapy with intravenous ceftriaxone. His cultures were positive for E. coli which was sensitive to cephalosporins.  Antibiotic therapy was transitioned to cefazolin and posteriorly to cephalexin. Patient will continue 5 more days of antibiotic therapy as outpatient  Old records personally reviewed, March 2020 CT of the abdomen with no hydronephrosis or obstructive uropathy.  Patient was seen by physical therapy/ occupational therapy, recommendations to continue care at the skilled nursing facility.  2.  Atrial fibrillation with rapid ventricular response.  Patient required intravenous diltiazem to control his heart rate.  The dose of metoprolol was increased to 50 mg twice daily with good toleration. By the time of discharge his atrial fibrillation has remained rate controlled.  Anticoagulation has been held due to intracranial  bleed.  3.  Acute kidney injury on chronic kidney disease stage IIIa, hypomagnesemia, hypokalemia.  baseline creatinine 1.2.  Patient received intravenous fluids with good toleration.  His kidney function improved.  His potassium was corrected along with his magnesium.  His discharge sodium 144, potassium 4.1, chloride 108, bicarb 23, glucose 98, BUN 33, creatinine 1.16.  Magnesium 1.9.  Will continue furosemide only as needed.   4.  Elevated liver enzymes likely due to hypoperfusion, liver shock.  With supportive medical therapy, intravenous fluids and IV antibiotics, his hemodynamics improved along with his liver profile.  5.  History of alcohol abuse.  Patient had no signs of alcohol withdrawal, he did not required any sedatives or benzodiazepines during his hospitalization.  6.  COVID-19 infection.  Patient fully vaccinated, his chest film had no signs of viral pneumonia.  Patient had no clinical or radiographic signs of COVID-19 disease.  He received 5 days of remdesivir with good toleration.  7.  Subdural hematoma/ subacute left cerebral hemisphere subdural hematoma,3 mm thickness. Acute subdural hemorrhage left frontoparietal convexity, 4 mm thickness.  Anticoagulation was held with good toleration.  Case was reviewed by neurosurgery, recommended close monitoring no surgical intervention needed.  Recommendation to follow-up CT, if stable to consider resumption of anticoagulation.  8.  Sacrum stage I pressure ulcer, present on admission.  Continue local wound care, patient was placed on nutritional supplements.  Discharge Diagnoses:  Principal Problem:   Sepsis due to Escherichia coli (E. coli) (HCC) Active Problems:   Essential hypertension, benign   Hx of asbestosis   S/P CABG x 4 with atrial clip   CAD (coronary artery disease)   LFT elevation   COVID-19 virus infection   AKI (acute kidney injury) (HCC)   Subdural hematoma (HCC)   Atrial fibrillation with RVR (HCC)    Pressure injury of skin    Discharge Instructions   Allergies as of 05/20/2020      Reactions   Norvasc [amlodipine Besylate] Other (See Comments)   Weakness   Symmetrel [amantadine] Cough   Hydrochlorothiazide Palpitations      Medication List    STOP taking these medications   metoprolol succinate 50 MG 24 hr tablet Commonly known as: TOPROL-XL   potassium chloride SA 20 MEQ tablet Commonly known as: KLOR-CON   warfarin 5 MG tablet Commonly known as: COUMADIN     TAKE these medications   acetaminophen 325 MG tablet Commonly known as: TYLENOL Take 2 tablets (650 mg total) by mouth every 6 (six) hours as needed for mild pain or headache (fever >/= 101).   cephALEXin 500 MG capsule Commonly known as: KEFLEX Take 1 capsule (500 mg total) by mouth every 12 (twelve) hours for 5 days.   Dilt-XR 240 MG 24 hr capsule Generic drug: diltiazem Take 240 mg by mouth every morning.   feeding supplement (ENSURE ENLIVE) Liqd Take 237 mLs by mouth 3 (three) times daily between meals.   furosemide 40 MG tablet Commonly known as: Lasix Take 1 tablet (40 mg total) by mouth daily as needed for fluid or edema (weight gain 3 lbs in 2 days or 5 lbs in 5 days.). What changed: See the new instructions.   hydrocortisone 2.5 % lotion Apply 1 application topically 2 (two) times daily.   metoprolol tartrate 50 MG tablet Commonly known as: LOPRESSOR Take 1 tablet (50 mg total) by mouth 2 (two) times daily.   multivitamin tablet Take 1 tablet by mouth daily.  Discharge Care Instructions  (From admission, onward)         Start     Ordered   05/20/20 0000  Discharge wound care:       Comments: Local skin care to sacrum stage 1 pressure ulcer.   05/20/20 1017          Allergies  Allergen Reactions  . Norvasc [Amlodipine Besylate] Other (See Comments)    Weakness   . Symmetrel [Amantadine] Cough  . Hydrochlorothiazide Palpitations    Consultations:  Neuro  surgery over the phone    Procedures/Studies: CT Head Wo Contrast  Addendum Date: 05/15/2020   ADDENDUM REPORT: 05/15/2020 23:10 ADDENDUM: These results were called by telephone at the time of interpretation on 05/15/2020 at 11:10 pm to provider Dr. Marlowe Sax, Who verbally acknowledged these results. Electronically Signed   By: Kellie Simmering DO   On: 05/15/2020 23:10   Result Date: 05/15/2020 CLINICAL DATA:  Mental status change, unknown cause; altered mental status. Additional provided: COVID positive. EXAM: CT HEAD WITHOUT CONTRAST TECHNIQUE: Contiguous axial images were obtained from the base of the skull through the vertex without intravenous contrast. COMPARISON:  CT head 07/03/2018 FINDINGS: Brain: Stable, moderate generalized parenchymal atrophy. Stable, moderate patchy hypoattenuation within the cerebral white matter is nonspecific, but consistent with chronic small vessel ischemic disease. There is a trace subdural collection overlying the left cerebral hemisphere measuring 3 mm in thickness, which is predominantly intermediate to low density. However, there is a 4 mm curvilinear focus of hyperdensity within this collection overlying the left frontoparietal convexity which may reflect a tiny focus of acute subdural hemorrhage (series 5, image 44). No demarcated cortical infarct. No evidence of intracranial mass. No midline shift. Vascular: No hyperdense vessel.  Atherosclerotic calcifications Skull: Normal. Negative for fracture or focal lesion. Sinuses/Orbits: Visualized orbits show no acute finding. Mild ethmoid sinus mucosal thickening. No significant mastoid effusion. IMPRESSION: Trace subdural collection overlying left cerebral hemisphere measuring up to 3 mm in thickness. This collection is predominantly intermediate to low density with these components likely reflecting a subacute to chronic subdural hematoma. However, there is a 4 mm curvilinear focus of hyperdensity within this collection  overlying the left frontoparietal convexity, which may reflect a tiny focus of acute subdural hemorrhage (versus dural thickening/calcification). Stable moderate generalized parenchymal atrophy and chronic small vessel ischemic disease. Mild ethmoid sinus mucosal thickening. Electronically Signed: By: Kellie Simmering DO On: 05/15/2020 22:41   US RENAL  Result Date: 05/15/2020 CLINICAL DATA:  Acute kidney injury EXAM: RENAL / URINARY TRACT ULTRASOUND COMPLETE COMPARISON:  CT abdomen pelvis 01/09/2019 FINDINGS: Right Kidney: Renal measurements: 10.2 x 4.8 x 5.2 cm = volume: 133.4 mL . Echogenicity is within normal limits. No concerning renal mass, shadowing calculus or hydronephrosis. Left Kidney: Renal measurements: 9.9 x 5.0 x 5.9 cm = volume: 150.1 mL. Echogenicity is within normal limits. Simple appearing anechoic partially exophytic cyst at the inferior pole left kidney corresponding well to the prior CT. No concerning renal mass, shadowing calculus or hydronephrosis. Bladder: Partially decompressed at the time of examination with heterogeneous dependently layering bladder debris and some mild bladder wall thickening with granulation. Other: Prostatomegaly with the prostate measuring 5.1 x 4.6 x 5.5 cm with an estimated volume of 67 mL. IMPRESSION: Mild bladder wall thickening and debris with mural crenulation and indentation of the bladder base by an enlarged prostate. Findings could reflect sequela of chronic outlet obstruction though should correlate with urinalysis results to exclude an acute cystitis. Simple appearing  left lower pole renal cyst. Otherwise unremarkable appearance of the kidneys. Electronically Signed   By: Lovena Le M.D.   On: 05/15/2020 20:28   DG Chest Portable 1 View  Result Date: 05/15/2020 CLINICAL DATA:  COVID-19 positive EXAM: PORTABLE CHEST 1 VIEW COMPARISON:  Radiograph 01/09/2019, CT 08/08/2015 FINDINGS: No new consolidative opacity is present however the extensive partially  calcified pleural plaque seen on multiple prior comparison imaging may obscure detection of underlying airspace disease. Chronic blunting of the left costophrenic sulcus is present compatible with chronic effusion seen on priors. Stable cardiomegaly with a calcified, tortuous aorta as well as features of prior sternotomy and CABG as well as placement of the left atrial occluder device. Dense coronary artery calcifications as well as coronary stenting is noted. No acute osseous or soft tissue abnormality. Telemetry leads overlie the chest. IMPRESSION: 1. No new consolidative opacity however the extensive partially calcified pleural plaque may obscure detection of underlying airspace disease. 2. Chronic left pleural effusion. 3. Stable cardiomegaly and postsurgical changes. Electronically Signed   By: Lovena Le M.D.   On: 05/15/2020 16:24   US Abdomen Limited RUQ  Result Date: 05/15/2020 CLINICAL DATA:  Elevated LFTs EXAM: ULTRASOUND ABDOMEN LIMITED RIGHT UPPER QUADRANT COMPARISON:  Ultrasound 01/10/2019, CT 01/09/2019 FINDINGS: Gallbladder: Layering shadowing gallstones and biliary sludge. Gallbladder wall is borderline thickened though partially decompressed at the time of exam. No pericholecystic fluid. Sonographic Murphy sign reportedly negative. Common bile duct: Diameter: 2.3 mm, nondilated. Liver: No focal lesion identified. Within normal limits in parenchymal echogenicity. Portal vein is patent on color Doppler imaging with normal direction of blood flow towards the liver. Other: Small right pleural effusion noted incidentally. IMPRESSION: Cholelithiasis and biliary sludge Borderline gallbladder wall thickening is nonspecific given underdistention and absence of additional features of acute cholecystitis. Trace right pleural effusion. Electronically Signed   By: Lovena Le M.D.   On: 05/15/2020 20:31        Subjective: Patient is feeling better, no nausea or vomiting, no chest pain or dyspnea.  Continue to be very weak and deconditioned.   Discharge Exam: Vitals:   05/19/20 2026 05/20/20 0453  BP: 137/79 (!) 145/83  Pulse: 100 98  Resp: 19 19  Temp: 97.6 F (36.4 C) 97.6 F (36.4 C)  SpO2: 98% 98%   Vitals:   05/19/20 1438 05/19/20 1645 05/19/20 2026 05/20/20 0453  BP: (!) 150/85 (!) 154/88 137/79 (!) 145/83  Pulse: 99 99 100 98  Resp: 20 20 19 19   Temp: 97.8 F (36.6 C) 98.2 F (36.8 C) 97.6 F (36.4 C) 97.6 F (36.4 C)  TempSrc: Oral Oral Oral Oral  SpO2: 98% 93% 98% 98%  Weight:    54.3 kg    General: no in pain or dyspnea.  Neurology: Awake and alert, non focal  E ENT: mild pallor, no icterus, oral mucosa moist Cardiovascular: No JVD. S1-S2 present, rhythmic, no gallops, rubs, or murmurs. No lower extremity edema. Pulmonary: positive breath sounds bilaterally, Gastrointestinal. Abdomen soft and non tender Skin. No rashes Musculoskeletal: no joint deformities   The results of significant diagnostics from this hospitalization (including imaging, microbiology, ancillary and laboratory) are listed below for reference.     Microbiology: Recent Results (from the past 240 hour(s))  Culture, blood (Routine x 2)     Status: Abnormal   Collection Time: 05/15/20  3:35 PM   Specimen: BLOOD LEFT WRIST  Result Value Ref Range Status   Specimen Description BLOOD LEFT WRIST  Final  Special Requests   Final    BOTTLES DRAWN AEROBIC AND ANAEROBIC Blood Culture adequate volume   Culture  Setup Time   Final    GRAM NEGATIVE RODS IN BOTH AEROBIC AND ANAEROBIC BOTTLES CRITICAL RESULT CALLED TO, READ BACK BY AND VERIFIED WITH: PHARMD G ABBOTT [id number] AT 786 AM BY CM Performed at [facility], [place] [address] [place], [place] [address]    Culture ESCHERICHIA COLI (A)  Final   Report Status 05/18/2020 FINAL  Final   Organism ID, Bacteria ESCHERICHIA COLI  Final      Susceptibility   Escherichia coli - MIC*    AMPICILLIN 8 SENSITIVE Sensitive     CEFAZOLIN <=4  SENSITIVE Sensitive     CEFEPIME <=0.12 SENSITIVE Sensitive     CEFTAZIDIME <=1 SENSITIVE Sensitive     CEFTRIAXONE <=0.25 SENSITIVE Sensitive     CIPROFLOXACIN <=0.25 SENSITIVE Sensitive     GENTAMICIN <=1 SENSITIVE Sensitive     IMIPENEM <=0.25 SENSITIVE Sensitive     TRIMETH/SULFA <=20 SENSITIVE Sensitive     AMPICILLIN/SULBACTAM <=2 SENSITIVE Sensitive     PIP/TAZO <=4 SENSITIVE Sensitive     * ESCHERICHIA COLI  Blood Culture ID Panel (Reflexed)     Status: Abnormal   Collection Time: 05/15/20  3:35 PM  Result Value Ref Range Status   Enterococcus species NOT DETECTED NOT DETECTED Final   Listeria monocytogenes NOT DETECTED NOT DETECTED Final   Staphylococcus species NOT DETECTED NOT DETECTED Final   Staphylococcus aureus (BCID) NOT DETECTED NOT DETECTED Final   Streptococcus species NOT DETECTED NOT DETECTED Final   Streptococcus agalactiae NOT DETECTED NOT DETECTED Final   Streptococcus pneumoniae NOT DETECTED NOT DETECTED Final   Streptococcus pyogenes NOT DETECTED NOT DETECTED Final   Acinetobacter baumannii NOT DETECTED NOT DETECTED Final   Enterobacteriaceae species DETECTED (A) NOT DETECTED Final    Comment: Enterobacteriaceae represent a large family of gram-negative bacteria, not a single organism. CRITICAL RESULT CALLED TO, READ BACK BY AND VERIFIED WITH: PHARMD G ABBOTT [id number] AT 283 AM BY CM    Enterobacter cloacae complex NOT DETECTED NOT DETECTED Final   Escherichia coli DETECTED (A) NOT DETECTED Final    Comment: CRITICAL RESULT CALLED TO, READ BACK BY AND VERIFIED WITH: PHARMD G ABBOTT [id number] AT 654 AM BY CM    Klebsiella oxytoca NOT DETECTED NOT DETECTED Final   Klebsiella pneumoniae NOT DETECTED NOT DETECTED Final   Proteus species NOT DETECTED NOT DETECTED Final   Serratia marcescens NOT DETECTED NOT DETECTED Final   Carbapenem resistance NOT DETECTED NOT DETECTED Final   Haemophilus influenzae NOT DETECTED NOT DETECTED Final   Neisseria meningitidis NOT  DETECTED NOT DETECTED Final   Pseudomonas aeruginosa NOT DETECTED NOT DETECTED Final   Candida albicans NOT DETECTED NOT DETECTED Final   Candida glabrata NOT DETECTED NOT DETECTED Final   Candida krusei NOT DETECTED NOT DETECTED Final   Candida parapsilosis NOT DETECTED NOT DETECTED Final   Candida tropicalis NOT DETECTED NOT DETECTED Final    Comment: Performed at [facility], [place] [address], [place], [place] [address]  Culture, blood (Routine x 2)     Status: Abnormal   Collection Time: 05/15/20  3:38 PM   Specimen: BLOOD RIGHT WRIST  Result Value Ref Range Status   Specimen Description BLOOD RIGHT WRIST  Final   Special Requests   Final    BOTTLES DRAWN AEROBIC AND ANAEROBIC Blood Culture adequate volume   Culture  Setup Time  Final    GRAM NEGATIVE RODS ANAEROBIC BOTTLE ONLY CRITICAL VALUE NOTED.  VALUE IS CONSISTENT WITH PREVIOUSLY REPORTED AND CALLED VALUE. GRAM POSITIVE COCCI AEROBIC BOTTLE ONLY Organism ID to follow CRITICAL RESULT CALLED TO, READ BACK BY AND VERIFIED WITH: A. Rogers Blocker PharmD 15:00 05/16/20 ([email or web address])    Culture (A)  Final    STAPHYLOCOCCUS CAPITIS ESCHERICHIA COLI SUSCEPTIBILITIES PERFORMED ON PREVIOUS CULTURE WITHIN THE LAST 5 DAYS. Performed at [facility], [place] [address], [place], [place] [address]    Report Status 05/18/2020 FINAL  Final  Blood Culture ID Panel (Reflexed)     Status: Abnormal   Collection Time: 05/15/20  3:38 PM  Result Value Ref Range Status   Enterococcus faecalis NOT DETECTED NOT DETECTED Final   Enterococcus Faecium NOT DETECTED NOT DETECTED Final   Listeria monocytogenes NOT DETECTED NOT DETECTED Final   Staphylococcus species DETECTED (A) NOT DETECTED Final    Comment: CRITICAL RESULT CALLED TO, READ BACK BY AND VERIFIED WITH: A. Rogers Blocker PharmD 15:00 05/16/20 ([email or web address])    Staphylococcus aureus (BCID) NOT DETECTED NOT DETECTED Final   Staphylococcus epidermidis NOT DETECTED NOT DETECTED Final   Staphylococcus  lugdunensis NOT DETECTED NOT DETECTED Final   Streptococcus species NOT DETECTED NOT DETECTED Final   Streptococcus agalactiae NOT DETECTED NOT DETECTED Final   Streptococcus pneumoniae NOT DETECTED NOT DETECTED Final   Streptococcus pyogenes NOT DETECTED NOT DETECTED Final   A.calcoaceticus-baumannii NOT DETECTED NOT DETECTED Final   Bacteroides fragilis NOT DETECTED NOT DETECTED Final   Enterobacterales NOT DETECTED NOT DETECTED Final   Enterobacter cloacae complex NOT DETECTED NOT DETECTED Final   Escherichia coli NOT DETECTED NOT DETECTED Final   Klebsiella aerogenes NOT DETECTED NOT DETECTED Final   Klebsiella oxytoca NOT DETECTED NOT DETECTED Final   Klebsiella pneumoniae NOT DETECTED NOT DETECTED Final   Proteus species NOT DETECTED NOT DETECTED Final   Salmonella species NOT DETECTED NOT DETECTED Final   Serratia marcescens NOT DETECTED NOT DETECTED Final   Haemophilus influenzae NOT DETECTED NOT DETECTED Final   Neisseria meningitidis NOT DETECTED NOT DETECTED Final   Pseudomonas aeruginosa NOT DETECTED NOT DETECTED Final   Stenotrophomonas maltophilia NOT DETECTED NOT DETECTED Final   Candida albicans NOT DETECTED NOT DETECTED Final   Candida auris NOT DETECTED NOT DETECTED Final   Candida glabrata NOT DETECTED NOT DETECTED Final   Candida krusei NOT DETECTED NOT DETECTED Final   Candida parapsilosis NOT DETECTED NOT DETECTED Final   Candida tropicalis NOT DETECTED NOT DETECTED Final   Cryptococcus neoformans/gattii NOT DETECTED NOT DETECTED Final    Comment: Performed at [facility], [address] [place], [place] [address]  SARS Coronavirus 2 by RT PCR (hospital order, performed in [facility] hospital lab) Nasopharyngeal Nasopharyngeal Swab     Status: Abnormal   Collection Time: 05/15/20  3:48 PM   Specimen: Nasopharyngeal Swab  Result Value Ref Range Status   SARS Coronavirus 2 POSITIVE (A) NEGATIVE Final    Comment: RESULT CALLED TO, READ BACK BY AND VERIFIED  WITH: D. WILSON RN, AT 1747 05/15/20 BY D. VANHOOK (NOTE) SARS-CoV-2 target nucleic acids are DETECTED  SARS-CoV-2 RNA is generally detectable in upper respiratory specimens  during the acute phase of infection.  Positive results are indicative  of the presence of the identified virus, but do not rule out bacterial infection or co-infection with other pathogens not detected by the test.  Clinical correlation with patient history and  other diagnostic information is necessary to  determine patient infection status.  The expected result is negative.  Fact Sheet for Patients:   StrictlyIdeas.no   Fact Sheet for Healthcare Providers:   BankingDealers.co.za    This test is not yet approved or cleared by the [place] FDA and  has been authorized for detection and/or diagnosis of SARS-CoV-2 by FDA under an Emergency Use Authorization (EUA).  This EUA will remain in effect (meaning  this test can be used) for the duration of  the COVID-19 declaration under Section 564(b)(1) of the Act, 21 U.S.C. section 360-bbb-3(b)(1), unless the authorization is terminated or revoked sooner.  Performed at [facility], [place] [address], [place], [place] [address]      Labs: BNP (last 3 results) No results for input(s): BNP in the last 8760 hours. Basic Metabolic Panel: Recent Labs  Lab 05/15/20 1533 05/16/20 0500 05/17/20 0500 05/18/20 0500 05/19/20 0429  NA 137 141 141 146* 144  K 4.2 3.4* 3.5 2.8* 4.1  CL 101 109 106 108 108  CO2 22 20* 25 27 23   GLUCOSE 138* 100* 112* 95 98  BUN 42* 42* 45* 39* 33*  CREATININE 2.28* 1.68* 1.51* 1.27* 1.16  CALCIUM 8.1* 6.9* 8.4* 8.9 8.9  MG  --  1.9  --   --   --   PHOS  --  3.6  --   --   --    Liver Function Tests: Recent Labs  Lab 05/15/20 1533 05/16/20 0500 05/17/20 0500 05/19/20 0429  AST 370* 178* 93* 38  ALT 221* 137* 114* 52*  ALKPHOS 211* 142* 135* 98  BILITOT 3.5* 2.0* 1.4* 1.2   PROT 6.2* 5.1* 5.8* 6.1*  ALBUMIN 3.0* 2.3* 2.7* 2.9*   No results for input(s): LIPASE, AMYLASE in the last 168 hours. No results for input(s): AMMONIA in the last 168 hours. CBC: Recent Labs  Lab 05/15/20 1533 05/16/20 0500 05/17/20 0500 05/18/20 0500 05/19/20 0429  WBC 18.6* 12.1* 8.0 5.0 4.8  NEUTROABS 15.3* 10.0* 6.7 4.1 3.5  HGB 13.6 11.9* 12.8* 13.4 14.3  HCT 41.7 36.1* 38.2* 40.7 43.5  MCV 93.9 94.0 92.9 92.7 94.0  PLT 122* 111* 118* 131* 143*   Cardiac Enzymes: Recent Labs  Lab 05/15/20 1533  CKTOTAL 150   BNP: Invalid input(s): POCBNP CBG: No results for input(s): GLUCAP in the last 168 hours. D-Dimer No results for input(s): DDIMER in the last 72 hours. Hgb A1c No results for input(s): HGBA1C in the last 72 hours. Lipid Profile No results for input(s): CHOL, HDL, LDLCALC, TRIG, CHOLHDL, LDLDIRECT in the last 72 hours. Thyroid function studies No results for input(s): TSH, T4TOTAL, T3FREE, THYROIDAB in the last 72 hours.  Invalid input(s): FREET3 Anemia work up No results for input(s): VITAMINB12, FOLATE, FERRITIN, TIBC, IRON, RETICCTPCT in the last 72 hours. Urinalysis    Component Value Date/Time   COLORURINE AMBER (A) 05/15/2020 1657   APPEARANCEUR CLOUDY (A) 05/15/2020 1657   LABSPEC 1.016 05/15/2020 1657   PHURINE 5.0 05/15/2020 1657   GLUCOSEU NEGATIVE 05/15/2020 1657   HGBUR MODERATE (A) 05/15/2020 1657   BILIRUBINUR NEGATIVE 05/15/2020 1657   KETONESUR 5 (A) 05/15/2020 1657   PROTEINUR 100 (A) 05/15/2020 1657   UROBILINOGEN 1.0 10/17/2013 1533   NITRITE NEGATIVE 05/15/2020 1657   LEUKOCYTESUR NEGATIVE 05/15/2020 1657   Sepsis Labs Invalid input(s): PROCALCITONIN,  WBC,  LACTICIDVEN Microbiology Recent Results (from the past 240 hour(s))  Culture, blood (Routine x 2)     Status: Abnormal   Collection  Time: 05/15/20  3:35 PM   Specimen: BLOOD LEFT WRIST  Result Value Ref Range Status   Specimen Description BLOOD LEFT WRIST  Final    Special Requests   Final    BOTTLES DRAWN AEROBIC AND ANAEROBIC Blood Culture adequate volume   Culture  Setup Time   Final    GRAM NEGATIVE RODS IN BOTH AEROBIC AND ANAEROBIC BOTTLES CRITICAL RESULT CALLED TO, READ BACK BY AND VERIFIED WITH: PHARMD G ABBOTT [id number] AT 33 AM BY CM Performed at [facility], [place] [address], [place], [place] [address]    Culture ESCHERICHIA COLI (A)  Final   Report Status 05/18/2020 FINAL  Final   Organism ID, Bacteria ESCHERICHIA COLI  Final      Susceptibility   Escherichia coli - MIC*    AMPICILLIN 8 SENSITIVE Sensitive     CEFAZOLIN <=4 SENSITIVE Sensitive     CEFEPIME <=0.12 SENSITIVE Sensitive     CEFTAZIDIME <=1 SENSITIVE Sensitive     CEFTRIAXONE <=0.25 SENSITIVE Sensitive     CIPROFLOXACIN <=0.25 SENSITIVE Sensitive     GENTAMICIN <=1 SENSITIVE Sensitive     IMIPENEM <=0.25 SENSITIVE Sensitive     TRIMETH/SULFA <=20 SENSITIVE Sensitive     AMPICILLIN/SULBACTAM <=2 SENSITIVE Sensitive     PIP/TAZO <=4 SENSITIVE Sensitive     * ESCHERICHIA COLI  Blood Culture ID Panel (Reflexed)     Status: Abnormal   Collection Time: 05/15/20  3:35 PM  Result Value Ref Range Status   Enterococcus species NOT DETECTED NOT DETECTED Final   Listeria monocytogenes NOT DETECTED NOT DETECTED Final   Staphylococcus species NOT DETECTED NOT DETECTED Final   Staphylococcus aureus (BCID) NOT DETECTED NOT DETECTED Final   Streptococcus species NOT DETECTED NOT DETECTED Final   Streptococcus agalactiae NOT DETECTED NOT DETECTED Final   Streptococcus pneumoniae NOT DETECTED NOT DETECTED Final   Streptococcus pyogenes NOT DETECTED NOT DETECTED Final   Acinetobacter baumannii NOT DETECTED NOT DETECTED Final   Enterobacteriaceae species DETECTED (A) NOT DETECTED Final    Comment: Enterobacteriaceae represent a large family of gram-negative bacteria, not a single organism. CRITICAL RESULT CALLED TO, READ BACK BY AND VERIFIED WITH: PHARMD G ABBOTT [id number] AT 937 AM  BY CM    Enterobacter cloacae complex NOT DETECTED NOT DETECTED Final   Escherichia coli DETECTED (A) NOT DETECTED Final    Comment: CRITICAL RESULT CALLED TO, READ BACK BY AND VERIFIED WITH: PHARMD G ABBOTT [id number] AT 938 AM BY CM    Klebsiella oxytoca NOT DETECTED NOT DETECTED Final   Klebsiella pneumoniae NOT DETECTED NOT DETECTED Final   Proteus species NOT DETECTED NOT DETECTED Final   Serratia marcescens NOT DETECTED NOT DETECTED Final   Carbapenem resistance NOT DETECTED NOT DETECTED Final   Haemophilus influenzae NOT DETECTED NOT DETECTED Final   Neisseria meningitidis NOT DETECTED NOT DETECTED Final   Pseudomonas aeruginosa NOT DETECTED NOT DETECTED Final   Candida albicans NOT DETECTED NOT DETECTED Final   Candida glabrata NOT DETECTED NOT DETECTED Final   Candida krusei NOT DETECTED NOT DETECTED Final   Candida parapsilosis NOT DETECTED NOT DETECTED Final   Candida tropicalis NOT DETECTED NOT DETECTED Final    Comment: Performed at [facility], [place] [address] [place], [place] [address]  Culture, blood (Routine x 2)     Status: Abnormal   Collection Time: 05/15/20  3:38 PM   Specimen: BLOOD RIGHT WRIST  Result Value Ref Range Status   Specimen Description BLOOD RIGHT  WRIST  Final   Special Requests   Final    BOTTLES DRAWN AEROBIC AND ANAEROBIC Blood Culture adequate volume   Culture  Setup Time   Final    GRAM NEGATIVE RODS ANAEROBIC BOTTLE ONLY CRITICAL VALUE NOTED.  VALUE IS CONSISTENT WITH PREVIOUSLY REPORTED AND CALLED VALUE. GRAM POSITIVE COCCI AEROBIC BOTTLE ONLY Organism ID to follow CRITICAL RESULT CALLED TO, READ BACK BY AND VERIFIED WITH: A. Rogers Blocker PharmD 15:00 05/16/20 ([email or web address])    Culture (A)  Final    STAPHYLOCOCCUS CAPITIS ESCHERICHIA COLI SUSCEPTIBILITIES PERFORMED ON PREVIOUS CULTURE WITHIN THE LAST 5 DAYS. Performed at [facility], [place] [address], [place], [place] [address]    Report Status 05/18/2020 FINAL  Final  Blood Culture ID  Panel (Reflexed)     Status: Abnormal   Collection Time: 05/15/20  3:38 PM  Result Value Ref Range Status   Enterococcus faecalis NOT DETECTED NOT DETECTED Final   Enterococcus Faecium NOT DETECTED NOT DETECTED Final   Listeria monocytogenes NOT DETECTED NOT DETECTED Final   Staphylococcus species DETECTED (A) NOT DETECTED Final    Comment: CRITICAL RESULT CALLED TO, READ BACK BY AND VERIFIED WITH: A. Rogers Blocker PharmD 15:00 05/16/20 ([email or web address])    Staphylococcus aureus (BCID) NOT DETECTED NOT DETECTED Final   Staphylococcus epidermidis NOT DETECTED NOT DETECTED Final   Staphylococcus lugdunensis NOT DETECTED NOT DETECTED Final   Streptococcus species NOT DETECTED NOT DETECTED Final   Streptococcus agalactiae NOT DETECTED NOT DETECTED Final   Streptococcus pneumoniae NOT DETECTED NOT DETECTED Final   Streptococcus pyogenes NOT DETECTED NOT DETECTED Final   A.calcoaceticus-baumannii NOT DETECTED NOT DETECTED Final   Bacteroides fragilis NOT DETECTED NOT DETECTED Final   Enterobacterales NOT DETECTED NOT DETECTED Final   Enterobacter cloacae complex NOT DETECTED NOT DETECTED Final   Escherichia coli NOT DETECTED NOT DETECTED Final   Klebsiella aerogenes NOT DETECTED NOT DETECTED Final   Klebsiella oxytoca NOT DETECTED NOT DETECTED Final   Klebsiella pneumoniae NOT DETECTED NOT DETECTED Final   Proteus species NOT DETECTED NOT DETECTED Final   Salmonella species NOT DETECTED NOT DETECTED Final   Serratia marcescens NOT DETECTED NOT DETECTED Final   Haemophilus influenzae NOT DETECTED NOT DETECTED Final   Neisseria meningitidis NOT DETECTED NOT DETECTED Final   Pseudomonas aeruginosa NOT DETECTED NOT DETECTED Final   Stenotrophomonas maltophilia NOT DETECTED NOT DETECTED Final   Candida albicans NOT DETECTED NOT DETECTED Final   Candida auris NOT DETECTED NOT DETECTED Final   Candida glabrata NOT DETECTED NOT DETECTED Final   Candida krusei NOT DETECTED NOT DETECTED Final   Candida  parapsilosis NOT DETECTED NOT DETECTED Final   Candida tropicalis NOT DETECTED NOT DETECTED Final   Cryptococcus neoformans/gattii NOT DETECTED NOT DETECTED Final    Comment: Performed at [facility], [address] [place], [place] [address]  SARS Coronavirus 2 by RT PCR (hospital order, performed in [facility] hospital lab) Nasopharyngeal Nasopharyngeal Swab     Status: Abnormal   Collection Time: 05/15/20  3:48 PM   Specimen: Nasopharyngeal Swab  Result Value Ref Range Status   SARS Coronavirus 2 POSITIVE (A) NEGATIVE Final    Comment: RESULT CALLED TO, READ BACK BY AND VERIFIED WITH: D. WILSON RN, AT 1747 05/15/20 BY D. VANHOOK (NOTE) SARS-CoV-2 target nucleic acids are DETECTED  SARS-CoV-2 RNA is generally detectable in upper respiratory specimens  during the acute phase of infection.  Positive results are indicative  of the presence of the identified virus, but  do not rule out bacterial infection or co-infection with other pathogens not detected by the test.  Clinical correlation with patient history and  other diagnostic information is necessary to determine patient infection status.  The expected result is negative.  Fact Sheet for Patients:   StrictlyIdeas.no   Fact Sheet for Healthcare Providers:   BankingDealers.co.za    This test is not yet approved or cleared by the [place] FDA and  has been authorized for detection and/or diagnosis of SARS-CoV-2 by FDA under an Emergency Use Authorization (EUA).  This EUA will remain in effect (meaning  this test can be used) for the duration of  the COVID-19 declaration under Section 564(b)(1) of the Act, 21 U.S.C. section 360-bbb-3(b)(1), unless the authorization is terminated or revoked sooner.  Performed at [facility], [place] [address], [place], [place] [address]      Time coordinating discharge: 45 minutes  SIGNED:   Tawni Millers, MD  [facility] 05/20/2020, 8:17 AM

## 2020-05-20 NOTE — Progress Notes (Signed)
Report given to [place]. Awaiting [facility] for discharge.

## 2020-05-20 NOTE — TOC Transition Note (Signed)
Transition of Care [facility]) - CM/SW Discharge Note   Patient Details  Name: ATLEE KLUTH MRN: [id number] Date of Birth: 1918-05-11  [facility] of [facility] CM/SW Contact:  Benard Halsted, [place] Phone Number: 05/20/2020, 10:09 AM   Clinical Narrative:    [facility] Anticipated DC date: 05/20/20 Family notified: Son, [name] Transport by: Corey Harold   Per MD patient ready for DC to [facility]. RN, patient, patient's family, and facility notified of DC. Discharge Summary and FL2 sent to facility. RN to call report prior to discharge [phone] Room [phone]). DC packet on chart. Ambulance transport requested for patient.   CSW will sign off for now as social work intervention is no longer needed. Please consult [place] again if new needs arise.      Final next level of care: Skilled Nursing Facility Barriers to Discharge: Barriers Resolved   Patient Goals and CMS Choice Patient states their goals for this hospitalization and ongoing recovery are:: Rehab CMS Medicare.gov Compare Post Acute Care list provided to:: Patient Represenative (must comment) (Son) Choice offered to / list presented to : Adult Children  Discharge Placement   Existing PASRR number confirmed : 05/20/20          Patient chooses bed at: [facility] Patient to be transferred to facility by: [place] Name of family member notified: Son, [name] Patient and family notified of of transfer: 05/20/20  Discharge Plan and Services In-house Referral: Clinical Social Work   Post Acute Care Choice: [place] Agency: NA        Social Determinants of Health (SDOH) Interventions     Readmission Risk Interventions No flowsheet data found.

## 2020-05-23 DIAGNOSIS — A4151 Sepsis due to Escherichia coli [E. coli]: Secondary | ICD-10-CM | POA: Diagnosis not present

## 2020-05-23 DIAGNOSIS — I482 Chronic atrial fibrillation, unspecified: Secondary | ICD-10-CM | POA: Diagnosis not present

## 2020-05-23 DIAGNOSIS — K7200 Acute and subacute hepatic failure without coma: Secondary | ICD-10-CM | POA: Diagnosis not present

## 2020-05-23 DIAGNOSIS — N1831 Chronic kidney disease, stage 3a: Secondary | ICD-10-CM | POA: Diagnosis not present

## 2020-06-09 ENCOUNTER — Encounter (HOSPITAL_COMMUNITY): Payer: Self-pay

## 2020-06-09 ENCOUNTER — Inpatient Hospital Stay (HOSPITAL_COMMUNITY)
Admission: EM | Admit: 2020-06-09 | Discharge: 2020-06-15 | DRG: 682 | Disposition: A | Discharge status: Skilled Nursing Facility | Payer: Medicare Other | Source: Skilled Nursing Facility | Attending: Internal Medicine | Admitting: Internal Medicine

## 2020-06-09 ENCOUNTER — Emergency Department (HOSPITAL_COMMUNITY): Payer: Medicare Other

## 2020-06-09 DIAGNOSIS — R339 Retention of urine, unspecified: Secondary | ICD-10-CM

## 2020-06-09 DIAGNOSIS — N183 Chronic kidney disease, stage 3 unspecified: Secondary | ICD-10-CM | POA: Diagnosis present

## 2020-06-09 DIAGNOSIS — L89151 Pressure ulcer of sacral region, stage 1: Secondary | ICD-10-CM | POA: Diagnosis present

## 2020-06-09 DIAGNOSIS — S065X9A Traumatic subdural hemorrhage with loss of consciousness of unspecified duration, initial encounter: Secondary | ICD-10-CM | POA: Diagnosis present

## 2020-06-09 DIAGNOSIS — G9341 Metabolic encephalopathy: Secondary | ICD-10-CM | POA: Diagnosis present

## 2020-06-09 DIAGNOSIS — M898X9 Other specified disorders of bone, unspecified site: Secondary | ICD-10-CM | POA: Diagnosis present

## 2020-06-09 DIAGNOSIS — Z955 Presence of coronary angioplasty implant and graft: Secondary | ICD-10-CM

## 2020-06-09 DIAGNOSIS — R400 Somnolence: Secondary | ICD-10-CM

## 2020-06-09 DIAGNOSIS — R4182 Altered mental status, unspecified: Secondary | ICD-10-CM

## 2020-06-09 DIAGNOSIS — R809 Proteinuria, unspecified: Secondary | ICD-10-CM | POA: Diagnosis present

## 2020-06-09 DIAGNOSIS — N179 Acute kidney failure, unspecified: Principal | ICD-10-CM | POA: Diagnosis present

## 2020-06-09 DIAGNOSIS — Z8616 Personal history of COVID-19: Secondary | ICD-10-CM

## 2020-06-09 DIAGNOSIS — Z66 Do not resuscitate: Secondary | ICD-10-CM | POA: Diagnosis present

## 2020-06-09 DIAGNOSIS — N320 Bladder-neck obstruction: Secondary | ICD-10-CM | POA: Diagnosis present

## 2020-06-09 DIAGNOSIS — I129 Hypertensive chronic kidney disease with stage 1 through stage 4 chronic kidney disease, or unspecified chronic kidney disease: Secondary | ICD-10-CM | POA: Diagnosis present

## 2020-06-09 DIAGNOSIS — Z87891 Personal history of nicotine dependence: Secondary | ICD-10-CM

## 2020-06-09 DIAGNOSIS — S065XAA Traumatic subdural hemorrhage with loss of consciousness status unknown, initial encounter: Secondary | ICD-10-CM | POA: Diagnosis present

## 2020-06-09 DIAGNOSIS — R823 Hemoglobinuria: Secondary | ICD-10-CM | POA: Diagnosis present

## 2020-06-09 DIAGNOSIS — L8990 Pressure ulcer of unspecified site, unspecified stage: Secondary | ICD-10-CM | POA: Diagnosis present

## 2020-06-09 DIAGNOSIS — I4821 Permanent atrial fibrillation: Secondary | ICD-10-CM | POA: Diagnosis present

## 2020-06-09 DIAGNOSIS — E870 Hyperosmolality and hypernatremia: Secondary | ICD-10-CM | POA: Diagnosis present

## 2020-06-09 DIAGNOSIS — N390 Urinary tract infection, site not specified: Secondary | ICD-10-CM

## 2020-06-09 DIAGNOSIS — E861 Hypovolemia: Secondary | ICD-10-CM | POA: Diagnosis present

## 2020-06-09 DIAGNOSIS — I6203 Nontraumatic chronic subdural hemorrhage: Secondary | ICD-10-CM | POA: Diagnosis present

## 2020-06-09 DIAGNOSIS — Z951 Presence of aortocoronary bypass graft: Secondary | ICD-10-CM

## 2020-06-09 DIAGNOSIS — Z77090 Contact with and (suspected) exposure to asbestos: Secondary | ICD-10-CM | POA: Diagnosis present

## 2020-06-09 DIAGNOSIS — R109 Unspecified abdominal pain: Secondary | ICD-10-CM | POA: Diagnosis not present

## 2020-06-09 DIAGNOSIS — I2510 Atherosclerotic heart disease of native coronary artery without angina pectoris: Secondary | ICD-10-CM | POA: Diagnosis present

## 2020-06-09 DIAGNOSIS — I10 Essential (primary) hypertension: Secondary | ICD-10-CM | POA: Diagnosis present

## 2020-06-09 DIAGNOSIS — E860 Dehydration: Secondary | ICD-10-CM | POA: Diagnosis present

## 2020-06-09 DIAGNOSIS — R54 Age-related physical debility: Secondary | ICD-10-CM | POA: Diagnosis present

## 2020-06-09 DIAGNOSIS — I4891 Unspecified atrial fibrillation: Secondary | ICD-10-CM | POA: Diagnosis present

## 2020-06-09 DIAGNOSIS — Z79899 Other long term (current) drug therapy: Secondary | ICD-10-CM

## 2020-06-09 DIAGNOSIS — Z888 Allergy status to other drugs, medicaments and biological substances status: Secondary | ICD-10-CM

## 2020-06-09 LAB — I-STAT CHEM 8, ED
BUN: 61 mg/dL — ABNORMAL HIGH (ref 8–23)
Calcium, Ion: 1.08 mmol/L — ABNORMAL LOW (ref 1.15–1.40)
Chloride: 100 mmol/L (ref 98–111)
Creatinine, Ser: 2.7 mg/dL — ABNORMAL HIGH (ref 0.61–1.24)
Glucose, Bld: 120 mg/dL — ABNORMAL HIGH (ref 70–99)
HCT: 37 % — ABNORMAL LOW (ref 39.0–52.0)
Hemoglobin: 12.6 g/dL — ABNORMAL LOW (ref 13.0–17.0)
Potassium: 3.8 mmol/L (ref 3.5–5.1)
Sodium: 145 mmol/L (ref 135–145)
TCO2: 36 mmol/L — ABNORMAL HIGH (ref 22–32)

## 2020-06-09 LAB — I-STAT VENOUS BLOOD GAS, ED
Acid-Base Excess: 14 mmol/L — ABNORMAL HIGH (ref 0.0–2.0)
Bicarbonate: 39.5 mmol/L — ABNORMAL HIGH (ref 20.0–28.0)
Calcium, Ion: 1.07 mmol/L — ABNORMAL LOW (ref 1.15–1.40)
HCT: 36 % — ABNORMAL LOW (ref 39.0–52.0)
Hemoglobin: 12.2 g/dL — ABNORMAL LOW (ref 13.0–17.0)
O2 Saturation: 62 %
Potassium: 3.8 mmol/L (ref 3.5–5.1)
Sodium: 146 mmol/L — ABNORMAL HIGH (ref 135–145)
TCO2: 41 mmol/L — ABNORMAL HIGH (ref 22–32)
pCO2, Ven: 53.2 mmHg (ref 44.0–60.0)
pH, Ven: 7.479 — ABNORMAL HIGH (ref 7.250–7.430)
pO2, Ven: 31 mmHg — CL (ref 32.0–45.0)

## 2020-06-09 LAB — CBG MONITORING, ED: Glucose-Capillary: 109 mg/dL — ABNORMAL HIGH (ref 70–99)

## 2020-06-09 NOTE — ED Provider Notes (Signed)
[facility] EMERGENCY DEPARTMENT Provider Note  CSN: [id number] Arrival date & time: 06/09/20 2251  Chief Complaint(s) Altered Mental Status  ED Triage Notes Molli Posey, RN (Registered Nurse) . [name] Emergency Medicine . [name] Date of Service: 06/09/2020 10:51 PM . . Signed   Pt comes from [facility], c/o of AMS and lethargic, refusing to eat and drink all day, pt responds to painful stimuli. Staff did in and out cath for urinary retention and got 900cc of urine.       HPI GAURAV BALDREE is a 86 y.o. male here from [facility] for increased lethargy and somnolence.   Record review: patient admitted on 8/1 for same and found to have sepsis from e.coli UTI. He was also noted to be COVID +, but no other symptoms. Also noted new SDH.  Remainder of history, ROS, and physical exam limited due to patient's condition (AMS). Additional information was obtained from EMS.   Level V Caveat.      HPI  Past Medical History Past Medical History:  Diagnosis Date  . Atrial fibrillation ([place])    with rapid ventricular repsonse  . Bone lesion    lytic  . Debility   . HTN (hypertension)   . S/P CABG x 4 with atrial clip 10/21/2013  . Small bowel obstruction [facility])    Patient Active Problem List   Diagnosis Date Noted  . Pressure injury of skin 05/17/2020  . Sepsis due to Escherichia coli (E. coli) ([place]) 05/16/2020  . Subdural hematoma ([place]) 05/16/2020  . Atrial fibrillation with RVR ([place]) 05/16/2020  . COVID-19 virus infection 05/15/2020  . COVID-19 05/15/2020  . AKI (acute kidney injury) ([place])   . Chest pain 01/10/2019  . Abdominal pain 01/10/2019  . LFT elevation 01/10/2019  . Abnormal CT of the chest 01/10/2019  . Swelling in head/neck   . CAD (coronary artery disease) 11/30/2013  . Chronic atrial fibrillation ([place]) 10/27/2013  . Chronic anticoagulation 10/27/2013  . Cardiomyopathy, ischemic- EF 35-40% echo 10/19/12 10/27/2013  . S/P CABG x 4 with atrial clip  10/21/2013  . Left main coronary artery disease 10/20/2013  . Hx of asbestosis 10/20/2013  . NSTEMI (non-ST elevated myocardial infarction) ([place]) 10/17/2013  . Essential hypertension, benign 08/21/2010  . Atrial fibrillation with rapid ventricular response on adm 10/17/13 08/21/2010   Home Medication(s) Prior to Admission medications   Medication Sig Start Date End Date Taking? Authorizing Provider  acetaminophen (TYLENOL) 325 MG tablet Take 2 tablets (650 mg total) by mouth every 6 (six) hours as needed for mild pain or headache (fever >/= 101). 05/20/20  Yes Arrien, Jimmy Picket, MD  DILT-XR 240 MG 24 hr capsule Take 240 mg by mouth every morning. 01/15/20  Yes [provider]  feeding supplement, ENSURE ENLIVE, (ENSURE ENLIVE) LIQD Take 237 mLs by mouth 3 (three) times daily between meals. 05/20/20 06/19/20 Yes Arrien, Jimmy Picket, MD  furosemide (LASIX) 40 MG tablet Take 1 tablet (40 mg total) by mouth daily as needed for fluid or edema (weight gain 3 lbs in 2 days or 5 lbs in 5 days.). 05/20/20 05/20/21 Yes Arrien, Jimmy Picket, MD  metoprolol tartrate (LOPRESSOR) 50 MG tablet Take 1 tablet (50 mg total) by mouth 2 (two) times daily. 05/20/20 06/19/20 Yes Arrien, Jimmy Picket, MD  Multiple Vitamin (MULTIVITAMIN) tablet Take 1 tablet by mouth daily.    Yes [provider]  Past Surgical History Past Surgical History:  Procedure Laterality Date  . APPENDECTOMY    . CLIPPING OF ATRIAL APPENDAGE  10/21/2013   Procedure: CLIPPING OF ATRIAL APPENDAGE;  Surgeon: Grace Isaac, MD;  Location: [place];  Service: Open Heart Surgery;;  . CORONARY ARTERY BYPASS GRAFT N/A 10/21/2013   Procedure: CORONARY ARTERY BYPASS GRAFTING (CABG);  Surgeon: Grace Isaac, MD;  Location: [place];  Service: Open Heart Surgery;  Laterality: N/A;  . HERNIA REPAIR     x2  .  INTRAOPERATIVE TRANSESOPHAGEAL ECHOCARDIOGRAM N/A 10/21/2013   Procedure: INTRAOPERATIVE TRANSESOPHAGEAL ECHOCARDIOGRAM;  Surgeon: Grace Isaac, MD;  Location: [place];  Service: Open Heart Surgery;  Laterality: N/A;  . LEFT HEART CATHETERIZATION WITH CORONARY ANGIOGRAM N/A 10/20/2013   Procedure: LEFT HEART CATHETERIZATION WITH CORONARY ANGIOGRAM;  Surgeon: Troy Sine, MD;  Location: [facility] CATH LAB;  Service: Cardiovascular;  Laterality: N/A;  . PARATHYROIDECTOMY     Family History No family history on file.  Social History Social History   Tobacco Use  . Smoking status: Former Research scientist (life sciences)  . Smokeless tobacco: Never Used  . Tobacco comment: During WWII  Vaping Use  . Vaping Use: Never used  Substance Use Topics  . Alcohol use: Yes    Alcohol/week: 7.0 standard drinks    Types: 7 Glasses of wine per week  . Drug use: No   Allergies Norvasc [amlodipine besylate], Symmetrel [amantadine], and Hydrochlorothiazide  Review of Systems Review of Systems  Unable to perform ROS: Mental status change    Physical Exam Vital Signs  I have reviewed the triage vital signs BP (!) 103/58   Pulse 71   Temp 97.6 F (36.4 C) (Axillary)   Resp (!) 28   SpO2 96%   Physical Exam Vitals reviewed.  Constitutional:      General: He is not in acute distress.    Appearance: He is well-developed. He is not diaphoretic.  HENT:     Head: Normocephalic and atraumatic.     Nose: Nose normal.  Eyes:     General: No scleral icterus.       Right eye: No discharge.        Left eye: No discharge.     Conjunctiva/sclera: Conjunctivae normal.     Pupils: Pupils are equal, round, and reactive to light.  Cardiovascular:     Rate and Rhythm: Normal rate and regular rhythm.     Heart sounds: No murmur heard.  No friction rub. No gallop.   Pulmonary:     Effort: Pulmonary effort is normal. No respiratory distress.     Breath sounds: Normal breath sounds. No stridor. No rales.  Chest:    Abdominal:       General: There is distension (in suprapubic region).     Palpations: Abdomen is soft.     Tenderness: There is no abdominal tenderness. There is no guarding or rebound.  Musculoskeletal:        General: No tenderness.     Cervical back: Normal range of motion and neck supple.  Skin:    General: Skin is warm and dry.     Findings: No erythema or rash.  Neurological:     Mental Status: He is lethargic.     Comments: Follows limited commands appropriately     ED Results and Treatments Labs (all labs ordered are listed, but only abnormal results are displayed) Labs Reviewed  COMPREHENSIVE METABOLIC PANEL - Abnormal; Notable for the following components:  Result Value   Sodium 147 (*)    CO2 33 (*)    Glucose, Bld 122 (*)    BUN 64 (*)    Creatinine, Ser 3.00 (*)    Calcium 8.8 (*)    Total Protein 6.4 (*)    Albumin 2.6 (*)    AST 14 (*)    GFR calc non Af Amer 16 (*)    GFR calc Af Amer 19 (*)    All other components within normal limits  CBC - Abnormal; Notable for the following components:   RBC 4.00 (*)    Hemoglobin 11.9 (*)    HCT 38.9 (*)    All other components within normal limits  URINALYSIS, ROUTINE W REFLEX MICROSCOPIC - Abnormal; Notable for the following components:   Color, Urine AMBER (*)    APPearance CLOUDY (*)    Hgb urine dipstick LARGE (*)    Protein, ur 100 (*)    Leukocytes,Ua SMALL (*)    RBC / HPF >50 (*)    Bacteria, UA RARE (*)    Non Squamous Epithelial 0-5 (*)    All other components within normal limits  CBG MONITORING, ED - Abnormal; Notable for the following components:   Glucose-Capillary 109 (*)    All other components within normal limits  I-STAT CHEM 8, ED - Abnormal; Notable for the following components:   BUN 61 (*)    Creatinine, Ser 2.70 (*)    Glucose, Bld 120 (*)    Calcium, Ion 1.08 (*)    TCO2 36 (*)    Hemoglobin 12.6 (*)    HCT 37.0 (*)    All other components within normal limits  I-STAT VENOUS BLOOD GAS,  ED - Abnormal; Notable for the following components:   pH, Ven 7.479 (*)    pO2, Ven 31.0 (*)    Bicarbonate 39.5 (*)    TCO2 41 (*)    Acid-Base Excess 14.0 (*)    Sodium 146 (*)    Calcium, Ion 1.07 (*)    HCT 36.0 (*)    Hemoglobin 12.2 (*)    All other components within normal limits  CULTURE, BLOOD (SINGLE)  URINE CULTURE  CULTURE, BLOOD (SINGLE)  LACTIC ACID, PLASMA  PROTIME-INR  APTT  AMMONIA  LACTIC ACID, PLASMA                                                                                                                         EKG  EKG Interpretation  Date/Time:  Thursday June 09 2020 23:17:13 EDT Ventricular Rate:  95 PR Interval:    QRS Duration: 118 QT Interval:  370 QTC Calculation: 464 R Axis:   -43 Text Interpretation: Atrial fibrillation Left axis deviation Left ventricular hypertrophy with QRS widening and repolarization abnormality ( R in aVL , Cornell product ) Abnormal ECG Otherwise no significant change Confirmed by Addison Lank [phone]) on 06/09/2020 11:22:28 PM      Radiology CT Head Wo Contrast  Result Date: 06/09/2020 CLINICAL DATA:  86 year old with mental status change.  Lethargy. EXAM: CT HEAD WITHOUT CONTRAST TECHNIQUE: Contiguous axial images were obtained from the base of the skull through the vertex without intravenous contrast. COMPARISON:  Head CT 05/15/2020 FINDINGS: Brain: Previous thin left subdural collection measures 2-3 mm, not significantly changed or minimally improved1 from prior. No hyperdense component to suggest acute subdural hemorrhage. There is no mass effect or midline shift. No evidence of acute or new hemorrhage. Stable degree of atrophy and chronic small vessel ischemia. No evidence of acute ischemia. Vascular: Atherosclerosis of skullbase vasculature without hyperdense vessel or abnormal calcification. Skull: No fracture or focal lesion. Sinuses/Orbits: Bilateral cataract resection.  No acute findings. Other: None.  IMPRESSION: 1. Previous thin left subdural collection measures 2-3 mm, not significantly changed or minimally improved from prior. No hyperdense component to suggest acute subdural hemorrhage. No mass effect or midline shift. 2. Stable atrophy and chronic small vessel ischemia. Electronically Signed   By: Keith Rake M.D.   On: 06/09/2020 23:58   DG ABD ACUTE 2+V W 1V CHEST  Result Date: 06/09/2020 CLINICAL DATA:  Altered mental status EXAM: DG ABDOMEN ACUTE W/ 1V CHEST COMPARISON:  08/16/2010, 05/15/2020 FINDINGS: Single-view chest demonstrates post sternotomy changes and atrial appendage clip. Small pleural effusions or pleural thickening. Extensive bilateral calcified pleural plaques. Stable cardiomediastinal silhouette with aortic atherosclerosis. Supine and upright views of the abdomen demonstrate a nonobstructed gas pattern. No radiopaque calculi are seen. Prominent degenerative changes of the spine. IMPRESSION: 1. Small pleural effusions or pleural thickening. Extensive bilateral calcified pleural plaques. 2. Nonobstructed gas pattern. Electronically Signed   By: Donavan Foil M.D.   On: 06/09/2020 23:46   CT Renal Stone Study  Result Date: 06/10/2020 CLINICAL DATA:  86 year old male with hematuria. EXAM: CT ABDOMEN AND PELVIS WITHOUT CONTRAST TECHNIQUE: Multidetector CT imaging of the abdomen and pelvis was performed following the standard protocol without IV contrast. COMPARISON:  CT abdomen pelvis dated 01/09/2019. FINDINGS: Evaluation of this exam is limited in the absence of intravenous contrast. Lower chest: Partially visualized small bilateral pleural effusions with rounded atelectasis of the lung bases similar to prior CT. There is diffuse calcified pleural plaques sequela of prior asbestos exposure. Advanced 3 vessel coronary vascular calcification and postsurgical changes of CABG. There is mild cardiomegaly. There is hypoattenuation of the cardiac blood pool suggestive of anemia.  Clinical correlation is recommended. No intra-abdominal free air or free fluid. Hepatobiliary: The liver is grossly unremarkable. Small gallstones. No pericholecystic fluid or evidence of acute cholecystitis by CT. Pancreas: Unremarkable. No pancreatic ductal dilatation or surrounding inflammatory changes. Spleen: Normal in size without focal abnormality. Adrenals/Urinary Tract: The adrenal glands unremarkable. There is no hydronephrosis or nephrolithiasis on either side. There is high attenuation of the renal medulla which may represent medullary nephrocalcinosis. Indeterminate 1 cm left renal interpolar hypodense lesion is not characterized. The visualized ureters are unremarkable. The urinary bladder is decompressed around a Foley catheter. Air within the urinary bladder introduced via the catheter. There is diffuse mildly thickened and trabeculated appearance of the bladder wall, likely related to chronic bladder outlet obstruction. Stomach/Bowel: There is sigmoid diverticulosis without active inflammatory changes. There is no bowel obstruction. The appendix is normal. Vascular/Lymphatic: Advanced aortoiliac atherosclerotic disease. The IVC is unremarkable no portal venous gas. There is no adenopathy. Reproductive: Enlarged prostate gland measuring approximately 6 cm in transverse axial diameter. Other: Bilateral hydroceles with high attenuating crescentic density within the testicles, indeterminate. Testicular ultrasound may provide better  evaluation on a nonemergent/outpatient basis. Musculoskeletal: There is advanced osteopenia. No acute osseous pathology. IMPRESSION: 1. No hydronephrosis or nephrolithiasis. 2. Mildly thickened and trabeculated appearance of the bladder wall, likely related to chronic bladder outlet obstruction. Correlation with urinalysis recommended to exclude cystitis. 3. Sigmoid diverticulosis. No bowel obstruction or active inflammation. 4.  Aortic Atherosclerosis (ICD10-I70.0). 5.  Bilateral pleural effusions and bibasilar rounded atelectasis and calcified pleural plaques as seen previously. Electronically Signed   By: Anner Crete M.D.   On: 06/10/2020 02:23    Pertinent labs & imaging results that were available during my care of the patient were reviewed by me and considered in my medical decision making (see chart for details).  Medications Ordered in ED Medications  sodium chloride 0.9 % bolus 1,000 mL (1,000 mLs Intravenous New Bag/Given 06/10/20 0150)    Followed by  0.9 %  sodium chloride infusion (1,000 mLs Intravenous New Bag/Given 06/10/20 0153)  cefTRIAXone (ROCEPHIN) 1 g in sodium chloride 0.9 % 100 mL IVPB (has no administration in time range)                                                                                                                                    Procedures Procedures  (including critical care time)  Medical Decision Making / ED Course I have reviewed the nursing notes for this encounter and the patient's prior records (if available in EHR or on provided paperwork).   Lalo Tromp Zingale was evaluated in Emergency Department on 06/10/2020 for the symptoms described in the history of present illness. He was evaluated in the context of the global COVID-19 pandemic, which necessitated consideration that the patient might be at risk for infection with the SARS-CoV-2 virus that causes COVID-19. Institutional protocols and algorithms that pertain to the evaluation of patients at risk for COVID-19 are in a state of rapid change based on information released by regulatory bodies including the CDC and federal and state organizations. These policies and algorithms were followed during the patient's care in the ED.    Clinical Course as of Jun 10 238  Thu Jun 09, 2020  2326 AMS and lethargy. Does follow limited commands.  Given previous presentation and reported urinary retention, assume likely similar process. Will obtain infectious work  up.  Also has h/o alcohol abuse, anticoagulation, and recent SDH, will also expand work up to assess for other processes.    [PC]  Fri Jun 10, 2020  6720 Work-up without overt infectious process. UA does have hematuria and positive leuks. Will treat for possible urinary tract infection. Patient does not have leukocytosis or elevated lactic acid concerning for sepsis at this time.  Patient also noted to have AKI and urinary retention with greater than 1000 cc in the bladder with bladder scan requiring Foley catheter. AKI is likely post renal obstruction. CT scan obtained which did not reveal any obstructing stones but did show evidence of  possible bladder outlet obstruction.  CT head with unchanged subdural.  We will discuss case with hospitalist service for admission.   [PC]    Clinical Course User Index [PC] Micaila Ziemba, Grayce Sessions, MD     Final Clinical Impression(s) / ED Diagnoses Final diagnoses:  Altered mental status  Somnolence  Lower urinary tract infectious disease  AKI (acute kidney injury) ([place])  Urinary retention      This chart was dictated using voice recognition software.  Despite best efforts to proofread,  errors can occur which can change the documentation meaning.   Fatima Blank, MD 06/10/20 [id number]

## 2020-06-09 NOTE — ED Triage Notes (Signed)
Pt comes from [facility], c/o of AMS and lethargic, refusing to eat and drink all day, pt responds to painful stimuli. Staff did in and out cath for urinary retention and got 900cc of urine.

## 2020-06-10 ENCOUNTER — Emergency Department (HOSPITAL_COMMUNITY): Payer: Medicare Other

## 2020-06-10 DIAGNOSIS — I2510 Atherosclerotic heart disease of native coronary artery without angina pectoris: Secondary | ICD-10-CM | POA: Diagnosis not present

## 2020-06-10 DIAGNOSIS — R339 Retention of urine, unspecified: Secondary | ICD-10-CM | POA: Diagnosis not present

## 2020-06-10 DIAGNOSIS — U071 COVID-19: Secondary | ICD-10-CM | POA: Diagnosis not present

## 2020-06-10 DIAGNOSIS — N3289 Other specified disorders of bladder: Secondary | ICD-10-CM | POA: Diagnosis not present

## 2020-06-10 DIAGNOSIS — I129 Hypertensive chronic kidney disease with stage 1 through stage 4 chronic kidney disease, or unspecified chronic kidney disease: Secondary | ICD-10-CM | POA: Diagnosis not present

## 2020-06-10 DIAGNOSIS — R1312 Dysphagia, oropharyngeal phase: Secondary | ICD-10-CM | POA: Diagnosis not present

## 2020-06-10 DIAGNOSIS — E860 Dehydration: Secondary | ICD-10-CM | POA: Diagnosis not present

## 2020-06-10 DIAGNOSIS — Z8616 Personal history of COVID-19: Secondary | ICD-10-CM | POA: Diagnosis not present

## 2020-06-10 DIAGNOSIS — I700 Atherosclerosis of aorta: Secondary | ICD-10-CM | POA: Diagnosis not present

## 2020-06-10 DIAGNOSIS — N179 Acute kidney failure, unspecified: Secondary | ICD-10-CM | POA: Diagnosis not present

## 2020-06-10 DIAGNOSIS — M898X9 Other specified disorders of bone, unspecified site: Secondary | ICD-10-CM | POA: Diagnosis present

## 2020-06-10 DIAGNOSIS — R2689 Other abnormalities of gait and mobility: Secondary | ICD-10-CM | POA: Diagnosis not present

## 2020-06-10 DIAGNOSIS — Z955 Presence of coronary angioplasty implant and graft: Secondary | ICD-10-CM | POA: Diagnosis not present

## 2020-06-10 DIAGNOSIS — I4891 Unspecified atrial fibrillation: Secondary | ICD-10-CM | POA: Diagnosis not present

## 2020-06-10 DIAGNOSIS — G319 Degenerative disease of nervous system, unspecified: Secondary | ICD-10-CM | POA: Diagnosis not present

## 2020-06-10 DIAGNOSIS — R54 Age-related physical debility: Secondary | ICD-10-CM | POA: Diagnosis not present

## 2020-06-10 DIAGNOSIS — L89151 Pressure ulcer of sacral region, stage 1: Secondary | ICD-10-CM | POA: Diagnosis not present

## 2020-06-10 DIAGNOSIS — K8020 Calculus of gallbladder without cholecystitis without obstruction: Secondary | ICD-10-CM | POA: Diagnosis not present

## 2020-06-10 DIAGNOSIS — Z77090 Contact with and (suspected) exposure to asbestos: Secondary | ICD-10-CM | POA: Diagnosis present

## 2020-06-10 DIAGNOSIS — Z951 Presence of aortocoronary bypass graft: Secondary | ICD-10-CM | POA: Diagnosis not present

## 2020-06-10 DIAGNOSIS — Z79899 Other long term (current) drug therapy: Secondary | ICD-10-CM | POA: Diagnosis not present

## 2020-06-10 DIAGNOSIS — Z66 Do not resuscitate: Secondary | ICD-10-CM | POA: Diagnosis not present

## 2020-06-10 DIAGNOSIS — E870 Hyperosmolality and hypernatremia: Secondary | ICD-10-CM | POA: Diagnosis not present

## 2020-06-10 DIAGNOSIS — K5730 Diverticulosis of large intestine without perforation or abscess without bleeding: Secondary | ICD-10-CM | POA: Diagnosis not present

## 2020-06-10 DIAGNOSIS — R809 Proteinuria, unspecified: Secondary | ICD-10-CM | POA: Diagnosis present

## 2020-06-10 DIAGNOSIS — Z888 Allergy status to other drugs, medicaments and biological substances status: Secondary | ICD-10-CM | POA: Diagnosis not present

## 2020-06-10 DIAGNOSIS — R4182 Altered mental status, unspecified: Secondary | ICD-10-CM | POA: Diagnosis not present

## 2020-06-10 DIAGNOSIS — I6203 Nontraumatic chronic subdural hemorrhage: Secondary | ICD-10-CM | POA: Diagnosis not present

## 2020-06-10 DIAGNOSIS — I214 Non-ST elevation (NSTEMI) myocardial infarction: Secondary | ICD-10-CM | POA: Diagnosis not present

## 2020-06-10 DIAGNOSIS — R823 Hemoglobinuria: Secondary | ICD-10-CM | POA: Diagnosis present

## 2020-06-10 DIAGNOSIS — J929 Pleural plaque without asbestos: Secondary | ICD-10-CM | POA: Diagnosis not present

## 2020-06-10 DIAGNOSIS — I6782 Cerebral ischemia: Secondary | ICD-10-CM | POA: Diagnosis not present

## 2020-06-10 DIAGNOSIS — N320 Bladder-neck obstruction: Secondary | ICD-10-CM | POA: Diagnosis not present

## 2020-06-10 DIAGNOSIS — I4821 Permanent atrial fibrillation: Secondary | ICD-10-CM | POA: Diagnosis not present

## 2020-06-10 DIAGNOSIS — G9341 Metabolic encephalopathy: Secondary | ICD-10-CM | POA: Diagnosis not present

## 2020-06-10 DIAGNOSIS — N183 Chronic kidney disease, stage 3 unspecified: Secondary | ICD-10-CM | POA: Diagnosis not present

## 2020-06-10 DIAGNOSIS — Z87891 Personal history of nicotine dependence: Secondary | ICD-10-CM | POA: Diagnosis not present

## 2020-06-10 DIAGNOSIS — I7090 Unspecified atherosclerosis: Secondary | ICD-10-CM | POA: Diagnosis not present

## 2020-06-10 DIAGNOSIS — E861 Hypovolemia: Secondary | ICD-10-CM | POA: Diagnosis not present

## 2020-06-10 DIAGNOSIS — R918 Other nonspecific abnormal finding of lung field: Secondary | ICD-10-CM | POA: Diagnosis not present

## 2020-06-10 LAB — COMPREHENSIVE METABOLIC PANEL
ALT: 10 U/L (ref 0–44)
ALT: 10 U/L (ref 0–44)
AST: 14 U/L — ABNORMAL LOW (ref 15–41)
AST: 15 U/L (ref 15–41)
Albumin: 2.6 g/dL — ABNORMAL LOW (ref 3.5–5.0)
Albumin: 2.4 g/dL — ABNORMAL LOW (ref 3.5–5.0)
Alkaline Phosphatase: 52 U/L (ref 38–126)
Alkaline Phosphatase: 48 U/L (ref 38–126)
Anion gap: 13 (ref 5–15)
Anion gap: 15 (ref 5–15)
BUN: 64 mg/dL — ABNORMAL HIGH (ref 8–23)
BUN: 57 mg/dL — ABNORMAL HIGH (ref 8–23)
CO2: 33 mmol/L — ABNORMAL HIGH (ref 22–32)
CO2: 30 mmol/L (ref 22–32)
Calcium: 8.8 mg/dL — ABNORMAL LOW (ref 8.9–10.3)
Calcium: 8.4 mg/dL — ABNORMAL LOW (ref 8.9–10.3)
Chloride: 101 mmol/L (ref 98–111)
Chloride: 103 mmol/L (ref 98–111)
Creatinine, Ser: 3 mg/dL — ABNORMAL HIGH (ref 0.61–1.24)
Creatinine, Ser: 2.36 mg/dL — ABNORMAL HIGH (ref 0.61–1.24)
GFR calc Af Amer: 19 mL/min — ABNORMAL LOW (ref >60)
GFR calc Af Amer: 25 mL/min — ABNORMAL LOW (ref >60)
GFR calc non Af Amer: 16 mL/min — ABNORMAL LOW (ref >60)
GFR calc non Af Amer: 22 mL/min — ABNORMAL LOW (ref >60)
Glucose, Bld: 122 mg/dL — ABNORMAL HIGH (ref 70–99)
Glucose, Bld: 99 mg/dL (ref 70–99)
Potassium: 4.1 mmol/L (ref 3.5–5.1)
Potassium: 3.3 mmol/L — ABNORMAL LOW (ref 3.5–5.1)
Sodium: 147 mmol/L — ABNORMAL HIGH (ref 135–145)
Sodium: 148 mmol/L — ABNORMAL HIGH (ref 135–145)
Total Bilirubin: 0.9 mg/dL (ref 0.3–1.2)
Total Bilirubin: 0.5 mg/dL (ref 0.3–1.2)
Total Protein: 6.4 g/dL — ABNORMAL LOW (ref 6.5–8.1)
Total Protein: 6.1 g/dL — ABNORMAL LOW (ref 6.5–8.1)

## 2020-06-10 LAB — URINE CULTURE: Culture: NO GROWTH

## 2020-06-10 LAB — CBC
HCT: 38.9 % — ABNORMAL LOW (ref 39.0–52.0)
HCT: 38.2 % — ABNORMAL LOW (ref 39.0–52.0)
Hemoglobin: 11.9 g/dL — ABNORMAL LOW (ref 13.0–17.0)
Hemoglobin: 11.8 g/dL — ABNORMAL LOW (ref 13.0–17.0)
MCH: 29.8 pg (ref 26.0–34.0)
MCH: 30 pg (ref 26.0–34.0)
MCHC: 30.6 g/dL (ref 30.0–36.0)
MCHC: 30.9 g/dL (ref 30.0–36.0)
MCV: 97.3 fL (ref 80.0–100.0)
MCV: 97.2 fL (ref 80.0–100.0)
Platelets: 211 10*3/uL (ref 150–400)
Platelets: 184 10*3/uL (ref 150–400)
RBC: 4 MIL/uL — ABNORMAL LOW (ref 4.22–5.81)
RBC: 3.93 MIL/uL — ABNORMAL LOW (ref 4.22–5.81)
RDW: 15.2 % (ref 11.5–15.5)
RDW: 15.4 % (ref 11.5–15.5)
WBC: 6.8 10*3/uL (ref 4.0–10.5)
WBC: 6.2 10*3/uL (ref 4.0–10.5)
nRBC: 0 % (ref 0.0–0.2)
nRBC: 0 % (ref 0.0–0.2)

## 2020-06-10 LAB — URINALYSIS, ROUTINE W REFLEX MICROSCOPIC
Bilirubin Urine: NEGATIVE
Glucose, UA: NEGATIVE mg/dL
Ketones, ur: NEGATIVE mg/dL
Nitrite: NEGATIVE
Protein, ur: 100 mg/dL — AB
RBC / HPF: >50 RBC/hpf — ABNORMAL HIGH (ref 0–5)
Specific Gravity, Urine: 1.016 (ref 1.005–1.030)
pH: 5 (ref 5.0–8.0)

## 2020-06-10 LAB — AMMONIA: Ammonia: 17 umol/L (ref 9–35)

## 2020-06-10 LAB — LACTIC ACID, PLASMA
Lactic Acid, Venous: 1.3 mmol/L (ref 0.5–1.9)
Lactic Acid, Venous: 1.2 mmol/L (ref 0.5–1.9)

## 2020-06-10 LAB — PROTIME-INR
INR: 1.1 (ref 0.8–1.2)
Prothrombin Time: 14.2 seconds (ref 11.4–15.2)

## 2020-06-10 LAB — APTT: aPTT: 32 seconds (ref 24–36)

## 2020-06-10 MED ORDER — KCL IN DEXTROSE-NACL 40-5-0.45 MEQ/L-%-% IV SOLN
INTRAVENOUS | Status: AC
  Filled 2020-06-10: qty 1000

## 2020-06-10 MED ORDER — METOPROLOL TARTRATE 50 MG PO TABS
50.0000 mg | ORAL_TABLET | Freq: Two times a day (BID) | ORAL | Status: DC
  Administered 2020-06-11 – 2020-06-15 (×9): 50 mg via ORAL
  Filled 2020-06-10 (×9): qty 1

## 2020-06-10 MED ORDER — SODIUM CHLORIDE 0.9 % IV SOLN
1000.0000 mL | INTRAVENOUS | Status: DC
  Administered 2020-06-10: 1000 mL via INTRAVENOUS

## 2020-06-10 MED ORDER — CHLORHEXIDINE GLUCONATE CLOTH 2 % EX PADS
6.0000 | MEDICATED_PAD | Freq: Every day | CUTANEOUS | Status: DC
  Administered 2020-06-11 – 2020-06-15 (×5): 6 via TOPICAL

## 2020-06-10 MED ORDER — SODIUM CHLORIDE 0.45 % IV SOLN: INTRAVENOUS | Status: DC

## 2020-06-10 MED ORDER — METOPROLOL TARTRATE 25 MG PO TABS: 12.5000 mg | ORAL_TABLET | Freq: Two times a day (BID) | ORAL | Status: DC

## 2020-06-10 MED ORDER — SODIUM CHLORIDE 0.9 % IV SOLN
1.0000 g | Freq: Once | INTRAVENOUS | Status: AC
  Administered 2020-06-10: 1 g via INTRAVENOUS
  Filled 2020-06-10: qty 10

## 2020-06-10 MED ORDER — SODIUM CHLORIDE 0.9 % IV BOLUS (SEPSIS)
1000.0000 mL | Freq: Once | INTRAVENOUS | Status: AC
  Administered 2020-06-10: 1000 mL via INTRAVENOUS

## 2020-06-10 MED ORDER — DILTIAZEM HCL ER COATED BEADS 120 MG PO CP24
240.0000 mg | ORAL_CAPSULE | Freq: Every day | ORAL | Status: DC
  Administered 2020-06-10 – 2020-06-15 (×6): 240 mg via ORAL
  Filled 2020-06-10 (×7): qty 2

## 2020-06-10 MED ORDER — DILTIAZEM HCL ER 240 MG PO CP24
240.0000 mg | ORAL_CAPSULE | Freq: Every morning | ORAL | Status: DC
  Filled 2020-06-10: qty 1

## 2020-06-10 MED ORDER — ACETAMINOPHEN 650 MG RE SUPP: 650.0000 mg | Freq: Four times a day (QID) | RECTAL | Status: DC | PRN

## 2020-06-10 MED ORDER — METOPROLOL TARTRATE 5 MG/5ML IV SOLN
2.5000 mg | Freq: Once | INTRAVENOUS | Status: AC
  Administered 2020-06-10: 2.5 mg via INTRAVENOUS
  Filled 2020-06-10: qty 5

## 2020-06-10 MED ORDER — SODIUM CHLORIDE 0.9 % IV SOLN
1.0000 g | INTRAVENOUS | Status: DC
  Administered 2020-06-11: 1 g via INTRAVENOUS
  Filled 2020-06-10: qty 10

## 2020-06-10 MED ORDER — ACETAMINOPHEN 325 MG PO TABS: 650.0000 mg | ORAL_TABLET | Freq: Four times a day (QID) | ORAL | Status: DC | PRN

## 2020-06-10 MED ORDER — METOPROLOL TARTRATE 12.5 MG HALF TABLET
12.5000 mg | ORAL_TABLET | Freq: Two times a day (BID) | ORAL | Status: DC
  Administered 2020-06-10: 12.5 mg via ORAL
  Filled 2020-06-10: qty 1

## 2020-06-10 MED ORDER — ONDANSETRON HCL 4 MG/2ML IJ SOLN: 4.0000 mg | Freq: Four times a day (QID) | INTRAMUSCULAR | Status: DC | PRN

## 2020-06-10 MED ORDER — ONDANSETRON HCL 4 MG PO TABS: 4.0000 mg | ORAL_TABLET | Freq: Four times a day (QID) | ORAL | Status: DC | PRN

## 2020-06-10 NOTE — ED Notes (Signed)
bladdedr scan shows >977ml in bladder

## 2020-06-10 NOTE — ED Notes (Signed)
Breakfast Ordered 

## 2020-06-10 NOTE — Progress Notes (Signed)
HPI: [name] is a 86 y.o. male with medical history significant of permanent atrial fibrillation, bone lytic lesion, hypertension, CAD/CABG, SBO, SDH who is coming from [facility] due to lethargy, decreased oral intake, urinary retention of 900 mL and altered mental status.   ED Course: Initial vital signs temperature 97.6 F, pulse 112, respirations 14, blood pressure 113/69 mmHg O2 sat 97% on room air.  The patient received a 1000 mL NS bolus and a gram of ceftriaxone IVPB in the emergency department.  CBC shows a white count 6.8, hemoglobin 11.9 g/dL and platelets 111.  PT, INR and PTT within normal limits. Urinalysis was cloudy, with large hemoglobinuria, proteinuria of 100 mg/dL, small leukocyte esterase, microscopic exam showing more than 50 RBC and rare bacteria per hpf.  Lactic acid was normal.  Blood cultures x2 were drawn.  Sodium 147 and CO2 33 mmol/L.  The rest of the electrolytes are within normal range when calcium is corrected to albumin.  Glucose 122, BUN 64 and creatinine 3.00 mg/dL.  Total protein 6.4, and albumin 2.6 g/dL.  The rest of the LFTs are within expected range.  Imaging: Abdomen and chest radiographs shows small pleural effusions or pleural thickening.  There was tensive bilateral calcified pleural plaques.  There was nonobstructive gas pattern.  CT head shows thin left subdural collection 2 to 3 mm which is basically unchanged from prior exam.  No mass affect or midline shift.  There is atrophy and chronic small vessel changes.  CT renal study did not show hydronephrosis or nephrolithiasis.  There was mildly thickened and trabeculated appearance of the bladder wall.  This is likely related to chronic bladder outlet obstruction.  Please see images and full regular report for further detail.  06/10/20: Seen and examined at his bedside in the ED.  He is alert and confused.  Unable to obtain a history due to confusion. Admitted for AKI likely prerenal in the setting of  dehydration, hypovolemic on exam.  Please refer to H&P dictated by my partner Dr. Olevia Bowens on 06/10/2020 for further details of the assessment and plan.

## 2020-06-10 NOTE — ED Notes (Signed)
Yvone Neu Shampine son [phone] would like an update on the pt

## 2020-06-10 NOTE — H&P (Signed)
History and Physical    [name] SVX:[id number] DOB: Feb 02, 1918 DOA: 06/09/2020  PCP: Lajean Manes, MD   Patient coming from: Home.  I have personally briefly reviewed patient's old medical records in [place]  Chief Complaint: AMS.  HPI: [name] is a 86 y.o. male with medical history significant of atrial fibrillation, bone lytic lesion, hypertension, CAD/CABG, SBO, SDH who is coming from [facility] due to lethargy, decreased oral intake, urinary retention of 900 mL and altered mental status. The Patient has only been responsive to noxious stimuli.  He is still obtunded and unable to answer any questions at this time.  ED Course: Initial vital signs temperature 97.6 F, pulse 112, respirations 14, blood pressure 113/69 mmHg O2 sat 97% on room air.  The patient received a 1000 mL NS bolus and a gram of ceftriaxone IVPB in the emergency department.  CBC shows a white count 6.8, hemoglobin 11.9 g/dL and platelets 111.  PT, INR and PTT within normal limits.Urinalysis was cloudy, with large hemoglobinuria, proteinuria of 100 mg/dL, small leukocyte esterase, microscopic exam showing more than 50 RBC and rare bacteria per hpf.  Lactic acid was normal.  Blood cultures x2 were drawn.  Sodium 147 and CO2 33 mmol/L.  The rest of the electrolytes are within normal range when calcium is corrected to albumin.  Glucose 122, BUN 64 and creatinine 3.00 mg/dL.  Total protein 6.4, and albumin 2.6 g/dL.  The rest of the LFTs are within expected range.  Imaging: Abdomen and chest radiographs shows small pleural effusions or pleural thickening.  There was tensive bilateral calcified pleural plaques.  There was nonobstructive gas pattern.  CT head shows thin left subdural collection 2 to 3 mm which is basically unchanged from prior exam.  No mass affect or midline shift.  There is atrophy and chronic small vessel changes.  CT renal study did not show hydronephrosis or nephrolithiasis.  There  was mildly thickened and trabeculated appearance of the bladder wall.  This is likely related to chronic bladder outlet obstruction.  Please see images and full regular report for further detail.  Review of Systems: As per HPI otherwise all other systems reviewed and are negative.  Past Medical History:  Diagnosis Date  . Atrial fibrillation ([place])    with rapid ventricular repsonse  . Bone lesion    lytic  . Debility   . HTN (hypertension)   . S/P CABG x 4 with atrial clip 10/21/2013  . Small bowel obstruction [facility])    Past Surgical History:  Procedure Laterality Date  . APPENDECTOMY    . CLIPPING OF ATRIAL APPENDAGE  10/21/2013   Procedure: CLIPPING OF ATRIAL APPENDAGE;  Surgeon: Grace Isaac, MD;  Location: [place];  Service: Open Heart Surgery;;  . CORONARY ARTERY BYPASS GRAFT N/A 10/21/2013   Procedure: CORONARY ARTERY BYPASS GRAFTING (CABG);  Surgeon: Grace Isaac, MD;  Location: [place];  Service: Open Heart Surgery;  Laterality: N/A;  . HERNIA REPAIR     x2  . INTRAOPERATIVE TRANSESOPHAGEAL ECHOCARDIOGRAM N/A 10/21/2013   Procedure: INTRAOPERATIVE TRANSESOPHAGEAL ECHOCARDIOGRAM;  Surgeon: Grace Isaac, MD;  Location: [place];  Service: Open Heart Surgery;  Laterality: N/A;  . LEFT HEART CATHETERIZATION WITH CORONARY ANGIOGRAM N/A 10/20/2013   Procedure: LEFT HEART CATHETERIZATION WITH CORONARY ANGIOGRAM;  Surgeon: Troy Sine, MD;  Location: [facility] CATH LAB;  Service: Cardiovascular;  Laterality: N/A;  . PARATHYROIDECTOMY     Social History  reports that he  has quit smoking. He has never used smokeless tobacco. He reports current alcohol use of about 7.0 standard drinks of alcohol per week. He reports that he does not use drugs.  Allergies  Allergen Reactions  . Norvasc [Amlodipine Besylate] Other (See Comments)    Weakness   . Symmetrel [Amantadine] Cough  . Hydrochlorothiazide Palpitations   Unable to obtain:   Prior to Admission medications   Medication Sig Start  Date End Date Taking? Authorizing Provider  acetaminophen (TYLENOL) 325 MG tablet Take 2 tablets (650 mg total) by mouth every 6 (six) hours as needed for mild pain or headache (fever >/= 101). 05/20/20  Yes Arrien, Jimmy Picket, MD  DILT-XR 240 MG 24 hr capsule Take 240 mg by mouth every morning. 01/15/20  Yes [provider]  feeding supplement, ENSURE ENLIVE, (ENSURE ENLIVE) LIQD Take 237 mLs by mouth 3 (three) times daily between meals. 05/20/20 06/19/20 Yes Arrien, Jimmy Picket, MD  furosemide (LASIX) 40 MG tablet Take 1 tablet (40 mg total) by mouth daily as needed for fluid or edema (weight gain 3 lbs in 2 days or 5 lbs in 5 days.). 05/20/20 05/20/21 Yes Arrien, Jimmy Picket, MD  metoprolol tartrate (LOPRESSOR) 50 MG tablet Take 1 tablet (50 mg total) by mouth 2 (two) times daily. 05/20/20 06/19/20 Yes Arrien, Jimmy Picket, MD  Multiple Vitamin (MULTIVITAMIN) tablet Take 1 tablet by mouth daily.    Yes [provider]    Physical Exam: Vitals:   06/10/20 0145 06/10/20 0315 06/10/20 0345 06/10/20 0500  BP: (!) 103/58 103/61 114/72 102/67  Pulse: 71  93 83  Resp: (!) 28 17 14 14   Temp:      TempSrc:      SpO2: 96%  98% 100%    Constitutional: NAD, but looks frail and chronically ill. Eyes: PERRL, lids and conjunctivae mildly injected. ENMT: Mucous membranes are dry.  Posterior pharynx clear of any exudate or lesions. Neck: normal, supple, no masses, no thyromegaly Respiratory: Decreased breath sounds in bases, otherwise clear to auscultation bilaterally, no wheezing, no crackles. Normal respiratory effort. No accessory muscle use.  Cardiovascular: Regular rate and rhythm, no murmurs / rubs / gallops. No extremity edema. 2+ pedal pulses. No carotid bruits.  Abdomen: Nondistended.  BS positive.  Soft, no tenderness, no masses palpated. No hepatosplenomegaly.  Who Musculoskeletal: no clubbing / cyanosis. Good ROM, no contractures. Normal muscle tone.  Skin: Some areas of  ecchymosis on extremities. Neurologic: Obtunded.  Looks grossly nonfocal.  Unable to fully evaluate. Psychiatric: Obtunded.  Labs on Admission: I have personally reviewed following labs and imaging studies  CBC: Recent Labs  Lab 06/09/20 2313 06/09/20 2327 06/09/20 2330  WBC 6.8  --   --   HGB 11.9* 12.2* 12.6*  HCT 38.9* 36.0* 37.0*  MCV 97.3  --   --   PLT 211  --   --     Basic Metabolic Panel: Recent Labs  Lab 06/09/20 2313 06/09/20 2327 06/09/20 2330  NA 147* 146* 145  K 4.1 3.8 3.8  CL 101  --  100  CO2 33*  --   --   GLUCOSE 122*  --  120*  BUN 64*  --  61*  CREATININE 3.00*  --  2.70*  CALCIUM 8.8*  --   --     GFR: CrCl cannot be calculated (Unknown ideal weight.).  Liver Function Tests: Recent Labs  Lab 06/09/20 2313  AST 14*  ALT 10  ALKPHOS 52  BILITOT  0.9  PROT 6.4*  ALBUMIN 2.6*    Urine analysis:    Component Value Date/Time   COLORURINE AMBER (A) 06/10/2020 0120   APPEARANCEUR CLOUDY (A) 06/10/2020 0120   LABSPEC 1.016 06/10/2020 0120   PHURINE 5.0 06/10/2020 0120   GLUCOSEU NEGATIVE 06/10/2020 0120   HGBUR LARGE (A) 06/10/2020 0120   BILIRUBINUR NEGATIVE 06/10/2020 0120   KETONESUR NEGATIVE 06/10/2020 0120   PROTEINUR 100 (A) 06/10/2020 0120   UROBILINOGEN 1.0 10/17/2013 1533   NITRITE NEGATIVE 06/10/2020 0120   LEUKOCYTESUR SMALL (A) 06/10/2020 0120    Radiological Exams on Admission: CT Head Wo Contrast  Result Date: 06/09/2020 CLINICAL DATA:  86 year old with mental status change.  Lethargy. EXAM: CT HEAD WITHOUT CONTRAST TECHNIQUE: Contiguous axial images were obtained from the base of the skull through the vertex without intravenous contrast. COMPARISON:  Head CT 05/15/2020 FINDINGS: Brain: Previous thin left subdural collection measures 2-3 mm, not significantly changed or minimally improved1 from prior. No hyperdense component to suggest acute subdural hemorrhage. There is no mass effect or midline shift. No evidence of  acute or new hemorrhage. Stable degree of atrophy and chronic small vessel ischemia. No evidence of acute ischemia. Vascular: Atherosclerosis of skullbase vasculature without hyperdense vessel or abnormal calcification. Skull: No fracture or focal lesion. Sinuses/Orbits: Bilateral cataract resection.  No acute findings. Other: None. IMPRESSION: 1. Previous thin left subdural collection measures 2-3 mm, not significantly changed or minimally improved from prior. No hyperdense component to suggest acute subdural hemorrhage. No mass effect or midline shift. 2. Stable atrophy and chronic small vessel ischemia. Electronically Signed   By: Keith Rake M.D.   On: 06/09/2020 23:58   DG ABD ACUTE 2+V W 1V CHEST  Result Date: 06/09/2020 CLINICAL DATA:  Altered mental status EXAM: DG ABDOMEN ACUTE W/ 1V CHEST COMPARISON:  08/16/2010, 05/15/2020 FINDINGS: Single-view chest demonstrates post sternotomy changes and atrial appendage clip. Small pleural effusions or pleural thickening. Extensive bilateral calcified pleural plaques. Stable cardiomediastinal silhouette with aortic atherosclerosis. Supine and upright views of the abdomen demonstrate a nonobstructed gas pattern. No radiopaque calculi are seen. Prominent degenerative changes of the spine. IMPRESSION: 1. Small pleural effusions or pleural thickening. Extensive bilateral calcified pleural plaques. 2. Nonobstructed gas pattern. Electronically Signed   By: Donavan Foil M.D.   On: 06/09/2020 23:46   CT Renal Stone Study  Result Date: 06/10/2020 CLINICAL DATA:  86 year old male with hematuria. EXAM: CT ABDOMEN AND PELVIS WITHOUT CONTRAST TECHNIQUE: Multidetector CT imaging of the abdomen and pelvis was performed following the standard protocol without IV contrast. COMPARISON:  CT abdomen pelvis dated 01/09/2019. FINDINGS: Evaluation of this exam is limited in the absence of intravenous contrast. Lower chest: Partially visualized small bilateral pleural effusions  with rounded atelectasis of the lung bases similar to prior CT. There is diffuse calcified pleural plaques sequela of prior asbestos exposure. Advanced 3 vessel coronary vascular calcification and postsurgical changes of CABG. There is mild cardiomegaly. There is hypoattenuation of the cardiac blood pool suggestive of anemia. Clinical correlation is recommended. No intra-abdominal free air or free fluid. Hepatobiliary: The liver is grossly unremarkable. Small gallstones. No pericholecystic fluid or evidence of acute cholecystitis by CT. Pancreas: Unremarkable. No pancreatic ductal dilatation or surrounding inflammatory changes. Spleen: Normal in size without focal abnormality. Adrenals/Urinary Tract: The adrenal glands unremarkable. There is no hydronephrosis or nephrolithiasis on either side. There is high attenuation of the renal medulla which may represent medullary nephrocalcinosis. Indeterminate 1 cm left renal interpolar hypodense lesion is not  characterized. The visualized ureters are unremarkable. The urinary bladder is decompressed around a Foley catheter. Air within the urinary bladder introduced via the catheter. There is diffuse mildly thickened and trabeculated appearance of the bladder wall, likely related to chronic bladder outlet obstruction. Stomach/Bowel: There is sigmoid diverticulosis without active inflammatory changes. There is no bowel obstruction. The appendix is normal. Vascular/Lymphatic: Advanced aortoiliac atherosclerotic disease. The IVC is unremarkable no portal venous gas. There is no adenopathy. Reproductive: Enlarged prostate gland measuring approximately 6 cm in transverse axial diameter. Other: Bilateral hydroceles with high attenuating crescentic density within the testicles, indeterminate. Testicular ultrasound may provide better evaluation on a nonemergent/outpatient basis. Musculoskeletal: There is advanced osteopenia. No acute osseous pathology. IMPRESSION: 1. No  hydronephrosis or nephrolithiasis. 2. Mildly thickened and trabeculated appearance of the bladder wall, likely related to chronic bladder outlet obstruction. Correlation with urinalysis recommended to exclude cystitis. 3. Sigmoid diverticulosis. No bowel obstruction or active inflammation. 4.  Aortic Atherosclerosis (ICD10-I70.0). 5. Bilateral pleural effusions and bibasilar rounded atelectasis and calcified pleural plaques as seen previously. Electronically Signed   By: Anner Crete M.D.   On: 06/10/2020 02:23    EKG: Independently reviewed.   Assessment/Plan Principal Problem:   AKI (acute kidney injury) ([place])  Secondary to prerenal azotemia. Observation/telemetry. Continue IV fluids. Monitor intake and output. Follow-up renal function electrolytes. Continue ceftriaxone pending cultures. Low suspicion for UTI though.  Active Problems:   Hypernatremia Free water deficit of at least 1.4 L. Recalculate once new weight measurement available. Continue NaCl 0.45% infusion. Monitor intake and output. Follow-up renal function electrolytes.    Essential hypertension, benign Continue metoprolol 50 mg p.o. twice daily. Furosemide has been held.    Subdural hematoma (HCC) Unchanged in size. Monitor neuro status.    Atrial fibrillation with RVR (HCC) CHA?DS?-VASc Score of at least 4. Not on anticoagulation. Continue metoprolol for rate control.    Pressure injury of skin Continue local care.    DVT prophylaxis: SCDs. Code Status:              DNR. Family Communication: Disposition Plan:   Patient is from:  [facility]  Anticipated DC to:  [facility].  Anticipated DC date:  06/12/2020.  Anticipated DC barriers: Clinical improvement.  Consults called:   Admission status:  Observation/telemetry.   Severity of Illness: High given acute kidney injury associated with volume depletion and hypernatremia.  Patient will need IV hydration with close sodium level, I andO's  monitoring.  Reubin Milan MD [facility]  How to contact the [facility] Attending or Consulting provider [place] or covering provider during after hours [place], for this patient?   1. Check the care team in [facility] and look for a) attending/consulting [facility] provider listed and b) the [facility] team listed 2. Log into [email or web address] and use [place]'s universal password to access. If you do not have the password, please contact the hospital operator. 3. Locate the [facility] provider you are looking for under [facility] and page to a number that you can be directly reached. 4. If you still have difficulty reaching the provider, please page the [facility] (Director on Call) for the Hospitalists listed on amion for assistance.  06/10/2020, 5:10 AM   This document was prepared using Dragon voice recognition software and may contain some unintended transcription errors.

## 2020-06-10 NOTE — Evaluation (Signed)
Clinical/Bedside Swallow Evaluation Patient Details  Name: [name] MRN: [id number] Date of Birth: 23-Jan-1918  Today's Date: 06/10/2020 Time: SLP Start Time (ACUTE ONLY): 43 SLP Stop Time (ACUTE ONLY): 1613 SLP Time Calculation (min) (ACUTE ONLY): 23 min  Past Medical History:  Past Medical History:  Diagnosis Date  . Atrial fibrillation ([name])    with rapid ventricular repsonse  . Bone lesion    lytic  . Debility   . HTN (hypertension)   . S/P CABG x 4 with atrial clip 10/21/2013  . Small bowel obstruction [facility])    Past Surgical History:  Past Surgical History:  Procedure Laterality Date  . APPENDECTOMY    . CLIPPING OF ATRIAL APPENDAGE  10/21/2013   Procedure: CLIPPING OF ATRIAL APPENDAGE;  Surgeon: [place] Isaac, MD;  Location: [place];  Service: Open Heart Surgery;;  . CORONARY ARTERY BYPASS GRAFT N/A 10/21/2013   Procedure: CORONARY ARTERY BYPASS GRAFTING (CABG);  Surgeon: [place] Isaac, MD;  Location: [place];  Service: Open Heart Surgery;  Laterality: N/A;  . HERNIA REPAIR     x2  . INTRAOPERATIVE TRANSESOPHAGEAL ECHOCARDIOGRAM N/A 10/21/2013   Procedure: INTRAOPERATIVE TRANSESOPHAGEAL ECHOCARDIOGRAM;  Surgeon: [place] Isaac, MD;  Location: [place];  Service: Open Heart Surgery;  Laterality: N/A;  . LEFT HEART CATHETERIZATION WITH CORONARY ANGIOGRAM N/A 10/20/2013   Procedure: LEFT HEART CATHETERIZATION WITH CORONARY ANGIOGRAM;  Surgeon: [place] Sine, MD;  Location: [facility] CATH LAB;  Service: Cardiovascular;  Laterality: N/A;  . PARATHYROIDECTOMY     HPI:  Pt is a 86 y.o. male with medical history significant of atrial fibrillation, bone lytic lesion, hypertension, CAD/CABG, SBO, SDH who presented to the ED from [facility] due to lethargy, decreased oral intake, urinary retention of 900 mL and altered mental status. Upon arrival pt was only responsive to noxious stimuli, obtunded, and unable to answer any questions. CT head 8/26: Previous thin left subdural collection  measures 2-3 mm, not significantly changed or minimally improved from prior. No acute changes. Pt was seen by speech pathology 8/2-8/6 and discharged on dysphagia 3 solids and nectar thick liquids with the recommendation for a modified barium swallow study in 10-14 days. CT chest Small pleural effusions or pleural thickening. Extensive bilateral calcified pleural plaques.    Assessment / Plan / Recommendation Clinical Impression  Pt's respiratory and swallow status significantly improved from previous admission 25 days ago when admitted with Covid. Today, respirations stable. Mild oral leakage and decreased cohesion with thin. No concerns for aspiration with thin, puree or solid and O2 sats and RR were within normal limits. Vocal quality clear throughout. Recommend pt initiate Dys 3, thin and upgrade to regular as he improves from medical standpoint. Pills whole in puree, straws allowed and ST will follow briefly for safety and upgrade as able.     SLP Visit Diagnosis: Dysphagia, unspecified (R13.10)    Aspiration Risk  Mild aspiration risk    Diet Recommendation Dysphagia 3 (Mech soft);Thin liquid   Liquid Administration via: Cup;Straw Medication Administration: Whole meds with puree Supervision: Patient able to self feed;Full supervision/cueing for compensatory strategies Compensations: Small sips/bites;Slow rate Postural Changes: Seated upright at 90 degrees    Other  Recommendations Oral Care Recommendations: Oral care BID   Follow up Recommendations Skilled Nursing facility      Frequency and Duration min 1 x/week  1 week       Prognosis Prognosis for Safe Diet Advancement: Good      Swallow Study  General HPI: Pt is a 86 y.o. male with medical history significant of atrial fibrillation, bone lytic lesion, hypertension, CAD/CABG, SBO, SDH who presented to the ED from [facility] due to lethargy, decreased oral intake, urinary retention of 900 mL and altered mental status. Upon  arrival pt was only responsive to noxious stimuli, obtunded, and unable to answer any questions. CT head 8/26: Previous thin left subdural collection measures 2-3 mm, not significantly changed or minimally improved from prior. No acute changes. Pt was seen by speech pathology 8/2-8/6 and discharged on dysphagia 3 solids and nectar thick liquids with the recommendation for a modified barium swallow study in 10-14 days. CT chest Small pleural effusions or pleural thickening. Extensive bilateral calcified pleural plaques.  Type of Study: Bedside Swallow Evaluation Previous Swallow Assessment:  (see HPI) Diet Prior to this Study: NPO Temperature Spikes Noted: No Respiratory Status: Room air History of Recent Intubation: No Behavior/Cognition: Pleasant mood;Cooperative;Alert;Requires cueing Oral Cavity Assessment: Dry Oral Care Completed by SLP: No Oral Cavity - Dentition: Adequate natural dentition Vision: Functional for self-feeding Self-Feeding Abilities: Able to feed self Patient Positioning: Upright in bed Baseline Vocal Quality: Normal Volitional Cough: Weak Volitional Swallow: Able to elicit    Oral/Motor/Sensory Function Overall Oral Motor/Sensory Function: Within functional limits   Ice Chips Ice chips: Within functional limits   Thin Liquid Thin Liquid: Impaired Presentation: Cup;Straw Oral Phase Impairments: Reduced labial seal Oral Phase Functional Implications: Right anterior spillage Pharyngeal  Phase Impairments:  (none)    Nectar Thick Nectar Thick Liquid: Not tested   Honey Thick Honey Thick Liquid: Not tested   Puree Puree: Within functional limits   Solid     Solid: Within functional limits      [name] 06/10/2020,5:06 PM  [name].Ed Risk analyst [phone] Office [phone]

## 2020-06-10 NOTE — Progress Notes (Signed)
New Admission Note:  Arrival Method: Via Stretcher from ER  Mental Orientation: alert to self Telemetry: A-fib on monitor Skin: Abrasions on both elbows, bruising on both elbows, blister on upper lips, Stage 1 on sacral area, and moisture associated damage on buttocks. Severely malnourished. IV: L forearm Pain:0/10 Safety Measures: Safety Fall Prevention Plan was given, discussed. Admission:unable to complete due to cognitive impairement 5M08: Patient has been orientated to the room, unit and the staff. Family: Son notified by ER nurse   Orders have been reviewed and implemented. Will continue to monitor the patient. Call light has been placed within reach and bed alarm has been activated.   Arta Silence ,RN

## 2020-06-10 NOTE — ED Notes (Signed)
Left message for physical therapy. No response.

## 2020-06-10 NOTE — Progress Notes (Signed)
Called and updated patient's son Mr. Navin Dogan via phone.  All questions answered.

## 2020-06-10 NOTE — ED Notes (Signed)
Pt noted to be tachycardiac in a-fib. Dr. [place] Crane notified.

## 2020-06-10 NOTE — ED Notes (Signed)
Please call Dewie Ahart (son) for updates. [phone]

## 2020-06-10 NOTE — ED Notes (Signed)
Update given to Piers (son)

## 2020-06-11 LAB — CBC WITH DIFFERENTIAL/PLATELET
Abs Immature Granulocytes: 0.11 10*3/uL — ABNORMAL HIGH (ref 0.00–0.07)
Basophils Absolute: 0 10*3/uL (ref 0.0–0.1)
Basophils Relative: 0 %
Eosinophils Absolute: 0.1 10*3/uL (ref 0.0–0.5)
Eosinophils Relative: 2 %
HCT: 34.9 % — ABNORMAL LOW (ref 39.0–52.0)
Hemoglobin: 11.2 g/dL — ABNORMAL LOW (ref 13.0–17.0)
Immature Granulocytes: 2 %
Lymphocytes Relative: 12 %
Lymphs Abs: 0.6 10*3/uL — ABNORMAL LOW (ref 0.7–4.0)
MCH: 31.4 pg (ref 26.0–34.0)
MCHC: 32.1 g/dL (ref 30.0–36.0)
MCV: 97.8 fL (ref 80.0–100.0)
Monocytes Absolute: 0.4 10*3/uL (ref 0.1–1.0)
Monocytes Relative: 8 %
Neutro Abs: 4 10*3/uL (ref 1.7–7.7)
Neutrophils Relative %: 76 %
Platelets: 209 10*3/uL (ref 150–400)
RBC: 3.57 MIL/uL — ABNORMAL LOW (ref 4.22–5.81)
RDW: 15.2 % (ref 11.5–15.5)
WBC: 5.3 10*3/uL (ref 4.0–10.5)
nRBC: 0 % (ref 0.0–0.2)

## 2020-06-11 LAB — BASIC METABOLIC PANEL
Anion gap: 9 (ref 5–15)
BUN: 34 mg/dL — ABNORMAL HIGH (ref 8–23)
CO2: 28 mmol/L (ref 22–32)
Calcium: 8.3 mg/dL — ABNORMAL LOW (ref 8.9–10.3)
Chloride: 108 mmol/L (ref 98–111)
Creatinine, Ser: 1.21 mg/dL (ref 0.61–1.24)
GFR calc Af Amer: 57 mL/min — ABNORMAL LOW (ref >60)
GFR calc non Af Amer: 49 mL/min — ABNORMAL LOW (ref >60)
Glucose, Bld: 106 mg/dL — ABNORMAL HIGH (ref 70–99)
Potassium: 3.7 mmol/L (ref 3.5–5.1)
Sodium: 145 mmol/L (ref 135–145)

## 2020-06-11 NOTE — Progress Notes (Signed)
Patient was found this morning pulling on foley catheter and had minimal bleeding from insertion site. Upon assessment, patient was confused and had to educated about the need for the foley catheter. Hands mittens placed on patient to provide distraction and Dr. [place] Crane notified via [place]. Will continue to monitor.   [place] Koslowski,RN.

## 2020-06-11 NOTE — Evaluation (Addendum)
Physical Therapy Evaluation Patient Details Name: [name] MRN: [id number] DOB: 12/29/1917 Today's Date: 06/11/2020   History of Present Illness  The pt is a 86 yo male presenting from [facility] due to lethargy, decreased PO intake, urinary retention, and AMS. Upon arrival, the pt was responsive to noxious stimuli only, CT on 8/26 showed no acute changes, chronic L subdural collection (2-3 mm). CT of chest revealed small pleural effusions or pleural thickenings. PMH includes: afib, HTN, CAD s/p CABG, SBO, bone lytic lesion, and SDH.    Clinical Impression  Pt in bed upon arrival of PT, agreeable to evaluation at this time. Prior to admission the pt was receiving assist for mobility and ADLs at [facility], but is currently unable to recall details of AD or how much assist he was given. The pt now presents with limitations in functional mobility, strength, power, activity tolerance, and sability due to above dx and chronically reduced activity, and will continue to benefit from skilled PT to address these deficits. The pt was able to complete bed mobility, initial OOB transfers, and short ambulation with min/modA and use of a RW for safety. The pt reports significant fatigue following a few lateral steps, and was returned to the bed as he reports generally "not feeling well" but unable to further describe or clarify. The pt will continue to benefit from skilled PT to progress functional strength, activity tolerance, and stability to allow for improved transfers and independence with mobility.     Follow Up Recommendations SNF    Equipment Recommendations  Other (comment) (defer to post acute)    Recommendations for Other Services       Precautions / Restrictions Precautions Precautions: Fall Restrictions Weight Bearing Restrictions: No      Mobility  Bed Mobility Overal bed mobility: Needs Assistance Bed Mobility: Supine to Sit     Supine to sit: Mod assist;HOB elevated      General bed mobility comments: assist with BLE and to elevate trunk, increased time, use of bed pad to scoot to EOB, +rail  Transfers Overall transfer level: Needs assistance Equipment used: Rolling walker (2 wheeled) Transfers: Sit to/from Stand Sit to Stand: Min assist;From elevated surface         General transfer comment: assist to power up and stabilize balance  Ambulation/Gait Ambulation/Gait assistance: Mod assist Gait Distance (Feet): 3 Feet Assistive device: Rolling walker (2 wheeled) Gait Pattern/deviations: Step-to pattern;Decreased stride length;Shuffle;Trunk flexed Gait velocity: decreased Gait velocity interpretation: <1.31 ft/sec, indicative of household ambulator General Gait Details: pt with short, unsteady lateral steps with modA to maintain balance, minA to manage RW movement.      Balance Overall balance assessment: Needs assistance Sitting-balance support: Feet supported;Single extremity supported Sitting balance-Leahy Scale: Poor Sitting balance - Comments: Patient relying on at least single extremity support. initially single UE and minA from therapist, progressed to minG Postural control: Posterior lean Standing balance support: Bilateral upper extremity supported;During functional activity Standing balance-Leahy Scale: Poor Standing balance comment: heavy reliance on external support                             Pertinent Vitals/Pain Pain Assessment: No/denies pain Pain Intervention(s): Limited activity within patient's tolerance;Monitored during session    [place] expects to be discharged to:: Assisted living ([place])               Home Equipment: Financial trader - single point Additional Comments:  Pt initially reports using RW with assist, later stating he uses scooter to get around.    Prior Function           Comments: pt unreliable historian, inconsistent answers about AD used at  [facility], unable to name any activity he enjoys at this time.     Hand Dominance   Dominant Hand: Right    Extremity/Trunk Assessment   Upper Extremity Assessment Upper Extremity Assessment: Generalized weakness    Lower Extremity Assessment Lower Extremity Assessment: Generalized weakness    Cervical / Trunk Assessment Cervical / Trunk Assessment: Kyphotic  Communication   Communication: HOH  Cognition Arousal/Alertness: Awake/alert Behavior During Therapy: WFL for tasks assessed/performed Overall Cognitive Status: No family/caregiver present to determine baseline cognitive functioning Area of Impairment: Orientation;Attention;Memory;Safety/judgement;Awareness;Problem solving;Following commands                 Orientation Level: Disoriented to;Place;Time;Situation Current Attention Level: Selective Memory: Decreased short-term memory Following Commands: Follows one step commands consistently;Follows one step commands with increased time Safety/Judgement: Decreased awareness of safety;Decreased awareness of deficits Awareness: Intellectual Problem Solving: Difficulty sequencing;Requires verbal cues;Slow processing;Decreased initiation General Comments: Patient able to follow one step commands though with increasedtime.  Not oriented and poor STM.  Difficulty sequencing mobility steps       General Comments General comments (skin integrity, edema, etc.): VSS through session    Exercises     Assessment/Plan    PT Assessment Patient needs continued PT services  PT Problem List Decreased strength;Decreased mobility;Decreased safety awareness;Decreased activity tolerance;Decreased cognition;Decreased balance       PT Treatment Interventions DME instruction;Therapeutic activities;Cognitive remediation;Gait training;Therapeutic exercise;Patient/family education;Balance training;Functional mobility training    PT Goals (Current goals can be found in the Care Plan  section)  Acute Rehab PT Goals Patient Stated Goal: to get more comfortable PT Goal Formulation: Patient unable to participate in goal setting Time For Goal Achievement: 06/25/20 Potential to Achieve Goals: Good    Frequency Min 2X/week    AM-PAC PT "6 Clicks" Mobility  Outcome Measure Help needed turning from your back to your side while in a flat bed without using bedrails?: A Little Help needed moving from lying on your back to sitting on the side of a flat bed without using bedrails?: A Little Help needed moving to and from a bed to a chair (including a wheelchair)?: A Lot Help needed standing up from a chair using your arms (e.g., wheelchair or bedside chair)?: A Little Help needed to walk in hospital room?: A Lot Help needed climbing 3-5 steps with a railing? : Total 6 Click Score: 14    End of Session Equipment Utilized During Treatment: Gait belt Activity Tolerance: Patient limited by fatigue Patient left: with restraints reapplied;in bed;with call bell/phone within reach;with bed alarm set Nurse Communication: Mobility status (pt inability to use call bell) PT Visit Diagnosis: Unsteadiness on feet (R26.81);Muscle weakness (generalized) (M62.81);Difficulty in walking, not elsewhere classified (R26.2)    Time: 0930-1001 PT Time Calculation (min) (ACUTE ONLY): 31 min   Charges:   PT Evaluation $PT Eval Moderate Complexity: 1 Mod PT Treatments $Gait Training: 8-22 mins        Karma Ganja, PT, DPT   Acute Rehabilitation Department Pager #: [phone]  Otho Bellows 06/11/2020, 10:45 AM

## 2020-06-11 NOTE — TOC Initial Note (Addendum)
Transition of Care [facility]) - Initial/Assessment Note    Patient Details  Name: [name] MRN: [id number] Date of Birth: 1918-04-17  Transition of [facility]) CM/SW Contact:    Varney Baas Phone Number: 06/11/2020, 12:26 PM  Clinical Narrative:                 4:00p CSW spoke with [facility] and confirmed patient can return at discharge. Elmo Putt will follow-up with CSW on whether a covid test is needed. CSW updated patient's son Mikael.  3:20p CSW spoke with Claiborne [facility] of [place] and was informed they are unable to provide a SNF bed for patient, only LTC. CSW spoke with patient's son Lang and provided the update. Patient's son stated he would like patient to return to [facility] for SNF. CSW contacted Elmo Putt with [employer] and left a voicemail, awaiting a callback.  12:25p CSW spoke with patient's son Artist Bloom, who identified himself as the best contact. Patient's son lives in [place]. Son Nishant stated patient is a permanent resident of [place]. Patient has been at [facility] for about 3 weeks, following a previous hospital stay. Son stated he has spoke with Lindajo Royal at [facility] about patient returning under the SNF level of care. He expressed they stated the earliest they could admit patient is Monday. CSW expressed understanding and agreed to follow-up with [facility] for SNF placement.   CSW contacted [employer] with [employer] and left a voicemail. Awaiting a callback.  Expected Discharge Plan: Skilled Nursing Facility Barriers to Discharge: No Barriers Identified   Patient Goals and CMS Choice   CMS Medicare.gov Compare Post Acute Care list provided to:: Patient Represenative (must comment) Danna Hefty, son) Choice offered to / list presented to : Adult Children  Expected Discharge Plan and Services Expected Discharge Plan: [place] Choice: [place] Living arrangements for the past  2 months: [place]                                      Prior Living Arrangements/Services Living arrangements for the past 2 months: [place] Lives with:: Facility Resident Patient language and need for interpreter reviewed:: Yes Do you feel safe going back to the place where you live?: Yes      Need for Family Participation in Patient Care: Yes (Comment) Care giver support system in place?: Yes (comment)      Activities of Daily Living      Permission Sought/Granted Permission sought to share information with : Facility Sport and exercise psychologist, Family Supports    Share Information with NAME: Sales promotion account executive  Permission granted to share info w AGENCY: SNF  Permission granted to share info w Relationship: son  Permission granted to share info w Contact Information: [phone]  Emotional Assessment   Attitude/Demeanor/Rapport: Unable to Assess Affect (typically observed): Unable to Assess Orientation: : Oriented to Self Alcohol / Substance Use: Not Applicable Psych Involvement: No (comment)  Admission diagnosis:  Lower urinary tract infectious disease [N39.0] Urinary retention [R33.9] Somnolence [R40.0] Altered mental status [R41.82] AKI (acute kidney injury) ([place]) [N17.9] Patient Active Problem List   Diagnosis Date Noted  . Hypernatremia 06/10/2020  . Pressure injury of skin 05/17/2020  . Sepsis due to Escherichia coli (E. coli) ([place]) 05/16/2020  . Subdural hematoma ([place]) 05/16/2020  . Atrial fibrillation with RVR ([place]) 05/16/2020  .  COVID-19 virus infection 05/15/2020  . COVID-19 05/15/2020  . AKI (acute kidney injury) ([place])   . Chest pain 01/10/2019  . Abdominal pain 01/10/2019  . LFT elevation 01/10/2019  . Abnormal CT of the chest 01/10/2019  . Swelling in head/neck   . CAD (coronary artery disease) 11/30/2013  . Chronic atrial fibrillation ([place]) 10/27/2013  . Chronic anticoagulation 10/27/2013  .  Cardiomyopathy, ischemic- EF 35-40% echo 10/19/12 10/27/2013  . S/P CABG x 4 with atrial clip 10/21/2013  . Left main coronary artery disease 10/20/2013  . Hx of asbestosis 10/20/2013  . NSTEMI (non-ST elevated myocardial infarction) ([place]) 10/17/2013  . Essential hypertension, benign 08/21/2010  . Atrial fibrillation with rapid ventricular response on adm 10/17/13 08/21/2010   PCP:  Lajean Manes, MD Pharmacy:   [facility] [address], [address] [place] [address] Phone: [phone] Fax: [phone]     Social Determinants of Health (SDOH) Interventions    Readmission Risk Interventions No flowsheet data found.

## 2020-06-11 NOTE — NC FL2 (Signed)
[place] LEVEL OF CARE SCREENING TOOL     IDENTIFICATION  Patient Name: [name] Birthdate: 1918-04-27 Sex: male Admission Date (Current Location): 06/09/2020  [facility] [place] Number:  Herbalist and Address:  [facility], [place] [address] [place], [place] [address]      Provider Number: [id number]  Attending Physician Name and Address:  Kayleen Memos, DO  Relative Name and Phone Number:  Lyndle Pang, son    Current Level of Care: Hospital Recommended Level of Care: [place] Prior Approval Number:    Date Approved/Denied:   PASRR Number: [id number]  Discharge Plan: SNF    Current Diagnoses: Patient Active Problem List   Diagnosis Date Noted  . Hypernatremia 06/10/2020  . Pressure injury of skin 05/17/2020  . Sepsis due to Escherichia coli (E. coli) ([place]) 05/16/2020  . Subdural hematoma ([place]) 05/16/2020  . Atrial fibrillation with RVR ([place]) 05/16/2020  . COVID-19 virus infection 05/15/2020  . COVID-19 05/15/2020  . AKI (acute kidney injury) ([place])   . Chest pain 01/10/2019  . Abdominal pain 01/10/2019  . LFT elevation 01/10/2019  . Abnormal CT of the chest 01/10/2019  . Swelling in head/neck   . CAD (coronary artery disease) 11/30/2013  . Chronic atrial fibrillation ([place]) 10/27/2013  . Chronic anticoagulation 10/27/2013  . Cardiomyopathy, ischemic- EF 35-40% echo 10/19/12 10/27/2013  . S/P CABG x 4 with atrial clip 10/21/2013  . Left main coronary artery disease 10/20/2013  . Hx of asbestosis 10/20/2013  . NSTEMI (non-ST elevated myocardial infarction) ([place]) 10/17/2013  . Essential hypertension, benign 08/21/2010  . Atrial fibrillation with rapid ventricular response on adm 10/17/13 08/21/2010    Orientation RESPIRATION BLADDER Height & Weight     Self, Place  Normal Incontinent, External catheter Weight:   Height:     BEHAVIORAL SYMPTOMS/MOOD NEUROLOGICAL BOWEL NUTRITION STATUS       Continent Diet (See discharge summary)  AMBULATORY STATUS COMMUNICATION OF NEEDS Skin   Limited Assist Verbally PU Stage and Appropriate Care                       Personal Care Assistance Level of Assistance  Bathing, Feeding, Dressing Bathing Assistance: Limited assistance Feeding assistance: Independent Dressing Assistance: Limited assistance     Functional Limitations Info  Sight, Hearing, Speech Sight Info: Adequate Hearing Info: Adequate Speech Info: Adequate    SPECIAL CARE FACTORS FREQUENCY  PT (By licensed PT), OT (By licensed OT)     PT Frequency: 5x a week OT Frequency: 5x a week            Contractures Contractures Info: Not present    Additional Factors Info  Code Status, Allergies Code Status Info: DNR Allergies Info: Norvasc (amlodipine Besylate); Symmetrel (amantadine); Hydrochlorothiazide           Current Medications (06/11/2020):  This is the current hospital active medication list Current Facility-Administered Medications  Medication Dose Route Frequency Provider Last Rate Last Admin  . acetaminophen (TYLENOL) tablet 650 mg  650 mg Oral Q6H PRN Reubin Milan, MD       Or  . acetaminophen (TYLENOL) suppository 650 mg  650 mg Rectal Q6H PRN Reubin Milan, MD      . Chlorhexidine Gluconate Cloth 2 % PADS 6 each  6 each Topical Daily Kayleen Memos, DO   6 each at 06/11/20 [phone]  . diltiazem (CARDIZEM CD) 24 hr capsule 240  mg  240 mg Oral Daily Hammons, Kimberly B, RPH   240 mg at 06/11/20 0933  . metoprolol tartrate (LOPRESSOR) tablet 50 mg  50 mg Oral BID Reubin Milan, MD   50 mg at 06/11/20 0933  . ondansetron (ZOFRAN) tablet 4 mg  4 mg Oral Q6H PRN Reubin Milan, MD       Or  . ondansetron [facility]) injection 4 mg  4 mg Intravenous Q6H PRN Reubin Milan, MD         Discharge Medications: Please see discharge summary for a list of discharge medications.  Relevant Imaging Results:  Relevant Lab  Results:   Additional Information SSN: [id number]  [place], [place]

## 2020-06-11 NOTE — Progress Notes (Signed)
PROGRESS NOTE  [name] DTO:[id number] DOB: 08-Feb-1918 DOA: 06/09/2020 PCP: Brent Manes, MD  HPI/Recap of past 24 hours: [name] a 86 y.o.malewith medical history significant ofpermanent atrial fibrillation, bone lytic lesion, hypertension, CAD/CABG, SBO, SDH who is coming from [facility] due to lethargy, decreased oral intake, urinary retention of 900 mL and altered mental status.  ED Course:Initial vital signs temperature 97.6 F, pulse 112, respirations 14, blood pressure 113/69 mmHg O2 sat 97% on room air. The patient received a 1000 mL NS bolus and a gram of ceftriaxone IVPB in the emergency department.  CBC shows a white count 6.8, hemoglobin 11.9 g/dL and platelets 111.PT, INR and PTT within normal limits. Urinalysis was cloudy, with large hemoglobinuria, proteinuria of 100 mg/dL, small leukocyte esterase, microscopic exam showing more than 50 RBC and rare bacteria per hpf. Lactic acid was normal. Blood cultures x2 were drawn. Sodium 147 and CO2 33 mmol/L. The rest of the electrolytes are within normal range when calcium is corrected to albumin. Glucose 122, BUN 64 and creatinine 3.00 mg/dL. Total protein 6.4, and albumin 2.6 g/dL. The rest of the LFTs are within expected range.  Imaging: Abdomen and chest radiographs shows small pleural effusions or pleural thickening. There was tensive bilateral calcified pleural plaques. There was nonobstructive gas pattern. CT head shows thin left subdural collection 2 to 3 mm which is basically unchanged from prior exam. No mass affect or midline shift. There is atrophy and chronic small vessel changes. CT renal study did not show hydronephrosis or nephrolithiasis. There was mildly thickened and trabeculated appearance of the bladder wall. This is likely related to chronic bladder outlet obstruction. Please see images and full regular report for further detail.   Admitted for AKI likely prerenal in  the setting of dehydration, hypovolemic on exam.  06/11/20: seen and examined at his bedside.  He is alert and oriented x2, pleasant.  Early this morning he pulled his Foley catheter which resulted in mild bleeding.  Soft mittens now in place for patient's own safety.  He has no new complaints.  Vital signs and labs reviewed and are stable.  Patient is medically stable for discharge to SNF once bed is available.  TOC assisting with SNF placement.  Assessment/Plan: Principal Problem:   AKI (acute kidney injury) ([place]) Active Problems:   Essential hypertension, benign   Subdural hematoma (HCC)   Atrial fibrillation with RVR (HCC)   Pressure injury of skin   Hypernatremia   Resolving AKI on CKD 3 a (acute kidney injury) ([place]) likely secondary to prerenal azotemia from dehydration Presented with creatinine of 3.0 Received IV fluid with improvement of his creatinine down to 1.2 Baseline creatinine appears to be 1.1  with GFR 52 Continue to avoid nephrotoxins and dehydration Continue to monitor urine output  Acute urinary retention, now post Foley catheter placement on 06/10/2020 Continue Foley catheter for now Add tamsulosin Likely will need to follow-up with urology outpatient for voiding trial Curbside with urology in the morning  Active Problems:   Hypovolemic hypernatremia Improved with gentle IV fluid hydration half-normal saline Now off IV fluid, encourage oral intake instead Continue to monitor  Acute metabolic encephalopathy, suspect multifactorial secondary to dehydration and hypernatremia Improving Reorient as needed  Essential hypertension BP is at goal Continue metoprolol 50 mg p.o. twice daily and diltiazem Furosemide has been held.    Subdural hematoma (HCC) Unchanged in size. Monitor neuro status.    Atrial fibrillation with RVR (HCC) CHA?DS?-VASc Score of  at least 4. Not on anticoagulation. Continue metoprolol and p.o. diltiazem for rate control.     Pressure injury of skin Continue local wound care    DVT prophylaxis:      SCDs. Code Status:              DNR. Family Communication: Disposition Plan:              Patient is from:                        [facility]             Anticipated DC to:                   [facility].             Anticipated DC date:               06/12/2020.             Anticipated DC barriers:         Clinical improvement.           Consults called:         None.   Status is: Inpatient   Dispo:  Patient From: Home  Planned Disposition: [place]  Expected discharge date: 06/12/20  Medically stable for discharge: No         Objective: Vitals:   06/11/20 0431 06/11/20 1035 06/11/20 1755 06/11/20 1755  BP: 128/86 114/72 128/90 128/90  Pulse: 91 83 80 (!) 55  Resp: 15 16 16    Temp: 98.2 F (36.8 C) 98.4 F (36.9 C) 98.6 F (37 C) 98 F (36.7 C)  TempSrc:  Oral Oral Oral  SpO2:  98% 99% (!) 88%    Intake/Output Summary (Last 24 hours) at 06/11/2020 1850 Last data filed at 06/11/2020 1839 Gross per 24 hour  Intake 1133.3 ml  Output 1100 ml  Net 33.3 ml   There were no vitals filed for this visit.  Exam:  . General: 86 y.o. year-old male well developed well nourished in no acute distress.  Alert and interactive. . Cardiovascular: Regular rate and rhythm with no rubs or gallops.     [name] Respiratory: Clear to auscultation with no wheezes or rales.  . Abdomen: Soft nontender nondistended with normal bowel sounds x4 quadrants. . Musculoskeletal: No lower extremity edema. [name] Psychiatry: Mood is appropriate for condition and setting   Data Reviewed: CBC: Recent Labs  Lab 06/09/20 2313 06/09/20 2327 06/09/20 2330 06/10/20 0550 06/11/20 0359  WBC 6.8  --   --  6.2 5.3  NEUTROABS  --   --   --   --  4.0  HGB 11.9* 12.2* 12.6* 11.8* 11.2*  HCT 38.9* 36.0* 37.0* 38.2* 34.9*  MCV 97.3  --   --  97.2 97.8  PLT 211  --   --  184 034   Basic Metabolic  Panel: Recent Labs  Lab 06/09/20 2313 06/09/20 2327 06/09/20 2330 06/10/20 0550 06/11/20 0359  NA 147* 146* 145 148* 145  K 4.1 3.8 3.8 3.3* 3.7  CL 101  --  100 103 108  CO2 33*  --   --  30 28  GLUCOSE 122*  --  120* 99 106*  BUN 64*  --  61* 57* 34*  CREATININE 3.00*  --  2.70* 2.36* 1.21  CALCIUM 8.8*  --   --  8.4* 8.3*   GFR:  CrCl cannot be calculated (Unknown ideal weight.). Liver Function Tests: Recent Labs  Lab 06/09/20 2313 06/10/20 0550  AST 14* 15  ALT 10 10  ALKPHOS 52 48  BILITOT 0.9 0.5  PROT 6.4* 6.1*  ALBUMIN 2.6* 2.4*   No results for input(s): LIPASE, AMYLASE in the last 168 hours. Recent Labs  Lab 06/09/20 2315  AMMONIA 17   Coagulation Profile: Recent Labs  Lab 06/09/20 2311  INR 1.1   Cardiac Enzymes: No results for input(s): CKTOTAL, CKMB, CKMBINDEX, TROPONINI in the last 168 hours. BNP (last 3 results) No results for input(s): PROBNP in the last 8760 hours. HbA1C: No results for input(s): HGBA1C in the last 72 hours. CBG: Recent Labs  Lab 06/09/20 2302  GLUCAP 109*   Lipid Profile: No results for input(s): CHOL, HDL, LDLCALC, TRIG, CHOLHDL, LDLDIRECT in the last 72 hours. Thyroid Function Tests: No results for input(s): TSH, T4TOTAL, FREET4, T3FREE, THYROIDAB in the last 72 hours. Anemia Panel: No results for input(s): VITAMINB12, FOLATE, FERRITIN, TIBC, IRON, RETICCTPCT in the last 72 hours. Urine analysis:    Component Value Date/Time   COLORURINE AMBER (A) 06/10/2020 0120   APPEARANCEUR CLOUDY (A) 06/10/2020 0120   LABSPEC 1.016 06/10/2020 0120   PHURINE 5.0 06/10/2020 0120   GLUCOSEU NEGATIVE 06/10/2020 0120   HGBUR LARGE (A) 06/10/2020 0120   BILIRUBINUR NEGATIVE 06/10/2020 0120   KETONESUR NEGATIVE 06/10/2020 0120   PROTEINUR 100 (A) 06/10/2020 0120   UROBILINOGEN 1.0 10/17/2013 1533   NITRITE NEGATIVE 06/10/2020 0120   LEUKOCYTESUR SMALL (A) 06/10/2020 0120   Sepsis  Labs: @LABRCNTIP (procalcitonin:4,lacticidven:4)  ) Recent Results (from the past 240 hour(s))  Blood culture (routine single)     Status: None (Preliminary result)   Collection Time: 06/10/20 12:20 AM   Specimen: BLOOD RIGHT FOREARM  Result Value Ref Range Status   Specimen Description BLOOD RIGHT FOREARM  Final   Special Requests   Final    BOTTLES DRAWN AEROBIC AND ANAEROBIC Blood Culture adequate volume   Culture   Final    NO GROWTH 1 DAY Performed at [facility], [place] [address] [place], [place] [address]    Report Status PENDING  Incomplete  Urine culture     Status: None   Collection Time: 06/10/20  1:22 AM   Specimen: In/Out Cath Urine  Result Value Ref Range Status   Specimen Description IN/OUT CATH URINE  Final   Special Requests NONE  Final   Culture   Final    NO GROWTH Performed at [facility], [place] [address] [place], [place] [address]    Report Status 06/10/2020 FINAL  Final  Culture, blood (single)     Status: None (Preliminary result)   Collection Time: 06/10/20  3:28 AM   Specimen: BLOOD  Result Value Ref Range Status   Specimen Description BLOOD RIGHT ANTECUBITAL  Final   Special Requests   Final    BOTTLES DRAWN AEROBIC AND ANAEROBIC Blood Culture results may not be optimal due to an inadequate volume of blood received in culture bottles   Culture   Final    NO GROWTH 1 DAY Performed at [facility], [place] [address] [place], [place] [address]    Report Status PENDING  Incomplete      Studies: No results found.  Scheduled Meds: . Chlorhexidine Gluconate Cloth  6 each Topical Daily  . diltiazem  240 mg Oral Daily  . metoprolol tartrate  50 mg Oral BID  Continuous Infusions:   LOS: 1 day     Kayleen Memos, MD [facility] Pager [phone]  If 7PM-7AM, please contact night-coverage [email or web address] Password [email or web address] 06/11/2020, 6:50 PM

## 2020-06-11 NOTE — Plan of Care (Signed)
  Problem: Education: Goal: Knowledge of General Education information will improve Description: Including pain rating scale, medication(s)/side effects and non-pharmacologic comfort measures Outcome: Progressing   Problem: Coping: Goal: Level of anxiety will decrease Outcome: Progressing   

## 2020-06-12 LAB — SARS CORONAVIRUS 2 BY RT PCR (HOSPITAL ORDER, PERFORMED IN ~~LOC~~ HOSPITAL LAB): SARS Coronavirus 2: POSITIVE — AB

## 2020-06-12 MED ORDER — TAMSULOSIN HCL 0.4 MG PO CAPS
0.4000 mg | ORAL_CAPSULE | Freq: Every day | ORAL | Status: DC
  Administered 2020-06-12 – 2020-06-15 (×4): 0.4 mg via ORAL
  Filled 2020-06-12 (×3): qty 1

## 2020-06-12 NOTE — Progress Notes (Signed)
Foley catheter discontinued at time. Patient tolerated procedure well, will continue to monitor.   Yuridia Couts,RN.

## 2020-06-12 NOTE — Progress Notes (Signed)
PROGRESS NOTE  [name] BTD:[id number] DOB: Mar 03, 1918 DOA: 06/09/2020 PCP: Lajean Manes, MD  HPI/Recap of past 24 hours: [id number] C [name] a 86 y.o.malewith medical history significant ofpermanent atrial fibrillation, bone lytic lesion, hypertension, CAD/CABG, SBO, SDH who is coming from [facility] due to lethargy, decreased oral intake, urinary retention of 900 mL and altered mental status.  ED Course: Imaging: Abdomen and chest radiographs shows small pleural effusions or pleural thickening. There was tensive bilateral calcified pleural plaques. There was nonobstructive gas pattern. CT head shows thin left subdural collection 2 to 3 mm which is basically unchanged from prior exam. No mass affect or midline shift. There is atrophy and chronic small vessel changes. CT renal study did not show hydronephrosis or nephrolithiasis. There was mildly thickened and trabeculated appearance of the bladder wall. This is likely related to chronic bladder outlet obstruction. UA negative for pyuria.  Admitted for AKI likely prerenal in the setting of dehydration, hypovolemic on exam.  Hospital course complicated by 2 episodes of confusion in which he pulled his Foley catheter resulting in mild bleeding.  Foley catheter was inserted due to acute urinary retention.  Foley discontinued on 06/12/2020 for patient's own safety.  We will proceed with straight in and out catheter.  06/12/20: Seen and examined.  He is calm and pleasant.  Does not remember pulling his Foley catheter.  Assessed by PT with recommendation for SNF.  TOC assisting with SNF placement.  Updated his son Yvone Neu via phone, all questions answered.   Assessment/Plan: Principal Problem:   AKI (acute kidney injury) ([place]) Active Problems:   Essential hypertension, benign   Subdural hematoma (HCC)   Atrial fibrillation with RVR (HCC)   Pressure injury of skin   Hypernatremia  Resolving AKI on CKD 3 a (acute kidney  injury) ([place]) likely secondary to prerenal azotemia from dehydration Presented with creatinine of 3.0 Received IV fluid with improvement of his creatinine down to 1.2 Baseline creatinine appears to be 1.1  with GFR 52 Continue to avoid nephrotoxins and dehydration Continue to monitor urine output  Acute urinary retention, now post Foley catheter placement on 06/10/2020 2 episodes of confusion in which he pulled his Foley catheter resulting in mild bleeding.  Foley catheter was inserted due to acute urinary retention.  Foley discontinued on 06/12/2020 for patient's own safety.  We will proceed with straight in and out catheter. Tamsulosin added. Will need to follow-up with urology outpatient.  Hypovolemic hypernatremia Improved with gentle IV fluid hydration half-normal saline Now off IV fluid, encourage oral intake Continue to monitor  Acute metabolic encephalopathy, suspect secondary to dehydration  Reorient as needed  Essential hypertension BP is at goal Continue metoprolol 50 mg p.o. twice daily and diltiazem Furosemide has been held due to presenting dehydration and hypovolemia.  Subdural hematoma (HCC) CT head done on 06/09/2020 No significant change    Atrial fibrillation with RVR (HCC) CHA?DS?-VASc Score of at least 4. Not on anticoagulation. Continue metoprolol and p.o. diltiazem for rate control.    Pressure injury of skin Continue local wound care    DVT prophylaxis:      SCDs. Code Status:              DNR. Family Communication: Disposition Plan:              Patient is from:                        [facility]  [facility] DC to:                   [facility].             Anticipated DC date:               06/13/2020.             Anticipated DC barriers:         Clinical improvement.           Consults called:         None.   Status is: Inpatient   Dispo:  Patient From: [place]  Planned  Disposition: [place]  Expected discharge date: 06/13/20  Medically stable for discharge: Yes         Objective: Vitals:   06/11/20 1755 06/11/20 2029 06/12/20 0500 06/12/20 1121  BP: 128/90 105/66 113/62 118/66  Pulse: (!) 55 88 76 77  Resp:  15 16 20   Temp: 98 F (36.7 C) 98.4 F (36.9 C) 98.2 F (36.8 C) 98.1 F (36.7 C)  TempSrc: Oral   Oral  SpO2: (!) 88%   90%    Intake/Output Summary (Last 24 hours) at 06/12/2020 1314 Last data filed at 06/12/2020 1200 Gross per 24 hour  Intake 870 ml  Output 725 ml  Net 145 ml   There were no vitals filed for this visit.  Exam:  . General: 86 y.o. year-old male pleasant well-developed well-nourished in no acute distress.  Alert and interactive.   . Cardiovascular: Irregular rate and rhythm no rubs or gallops.   [name] Respiratory: Clear to auscultation no wheezes no rales.   . Abdomen: Soft nontender normal bowel sounds present. . Musculoskeletal: No lower extremity edema bilaterally.   [name] Psychiatry: Mood is appropriate for condition and setting.  Data Reviewed: CBC: Recent Labs  Lab 06/09/20 2313 06/09/20 2327 06/09/20 2330 06/10/20 0550 06/11/20 0359  WBC 6.8  --   --  6.2 5.3  NEUTROABS  --   --   --   --  4.0  HGB 11.9* 12.2* 12.6* 11.8* 11.2*  HCT 38.9* 36.0* 37.0* 38.2* 34.9*  MCV 97.3  --   --  97.2 97.8  PLT 211  --   --  184 700   Basic Metabolic Panel: Recent Labs  Lab 06/09/20 2313 06/09/20 2327 06/09/20 2330 06/10/20 0550 06/11/20 0359  NA 147* 146* 145 148* 145  K 4.1 3.8 3.8 3.3* 3.7  CL 101  --  100 103 108  CO2 33*  --   --  30 28  GLUCOSE 122*  --  120* 99 106*  BUN 64*  --  61* 57* 34*  CREATININE 3.00*  --  2.70* 2.36* 1.21  CALCIUM 8.8*  --   --  8.4* 8.3*   GFR: CrCl cannot be calculated (Unknown ideal weight.). Liver Function Tests: Recent Labs  Lab 06/09/20 2313 06/10/20 0550  AST 14* 15  ALT 10 10  ALKPHOS 52 48  BILITOT 0.9 0.5  PROT 6.4* 6.1*  ALBUMIN  2.6* 2.4*   No results for input(s): LIPASE, AMYLASE in the last 168 hours. Recent Labs  Lab 06/09/20 2315  AMMONIA 17   Coagulation Profile: Recent Labs  Lab 06/09/20 2311  INR 1.1   Cardiac Enzymes: No results for input(s): CKTOTAL, CKMB, CKMBINDEX, TROPONINI in the last 168 hours. BNP (last 3 results) No results for input(s): PROBNP in the last 8760  hours. HbA1C: No results for input(s): HGBA1C in the last 72 hours. CBG: Recent Labs  Lab 06/09/20 2302  GLUCAP 109*   Lipid Profile: No results for input(s): CHOL, HDL, LDLCALC, TRIG, CHOLHDL, LDLDIRECT in the last 72 hours. Thyroid Function Tests: No results for input(s): TSH, T4TOTAL, FREET4, T3FREE, THYROIDAB in the last 72 hours. Anemia Panel: No results for input(s): VITAMINB12, FOLATE, FERRITIN, TIBC, IRON, RETICCTPCT in the last 72 hours. Urine analysis:    Component Value Date/Time   COLORURINE AMBER (A) 06/10/2020 0120   APPEARANCEUR CLOUDY (A) 06/10/2020 0120   LABSPEC 1.016 06/10/2020 0120   PHURINE 5.0 06/10/2020 0120   GLUCOSEU NEGATIVE 06/10/2020 0120   HGBUR LARGE (A) 06/10/2020 0120   BILIRUBINUR NEGATIVE 06/10/2020 0120   KETONESUR NEGATIVE 06/10/2020 0120   PROTEINUR 100 (A) 06/10/2020 0120   UROBILINOGEN 1.0 10/17/2013 1533   NITRITE NEGATIVE 06/10/2020 0120   LEUKOCYTESUR SMALL (A) 06/10/2020 0120   Sepsis Labs: @LABRCNTIP (procalcitonin:4,lacticidven:4)  ) Recent Results (from the past 240 hour(s))  Blood culture (routine single)     Status: None (Preliminary result)   Collection Time: 06/10/20 12:20 AM   Specimen: BLOOD RIGHT FOREARM  Result Value Ref Range Status   Specimen Description BLOOD RIGHT FOREARM  Final   Special Requests   Final    BOTTLES DRAWN AEROBIC AND ANAEROBIC Blood Culture adequate volume   Culture   Final    NO GROWTH 1 DAY Performed at [facility], [place] [address] [place], [place] [address]    Report Status PENDING  Incomplete  Urine culture     Status:  None   Collection Time: 06/10/20  1:22 AM   Specimen: In/Out Cath Urine  Result Value Ref Range Status   Specimen Description IN/OUT CATH URINE  Final   Special Requests NONE  Final   Culture   Final    NO GROWTH Performed at [facility], [place] [address], [place], [place] [address]    Report Status 06/10/2020 FINAL  Final  Culture, blood (single)     Status: None (Preliminary result)   Collection Time: 06/10/20  3:28 AM   Specimen: BLOOD  Result Value Ref Range Status   Specimen Description BLOOD RIGHT ANTECUBITAL  Final   Special Requests   Final    BOTTLES DRAWN AEROBIC AND ANAEROBIC Blood Culture results may not be optimal due to an inadequate volume of blood received in culture bottles   Culture   Final    NO GROWTH 1 DAY Performed at [facility], [place] [address], [place], [place] [address]    Report Status PENDING  Incomplete      Studies: No results found.  Scheduled Meds: . Chlorhexidine Gluconate Cloth  6 each Topical Daily  . diltiazem  240 mg Oral Daily  . metoprolol tartrate  50 mg Oral BID  . tamsulosin  0.4 mg Oral Daily    Continuous Infusions:   LOS: 2 days     Kayleen Memos, MD [facility] Pager [phone]  If 7PM-7AM, please contact night-coverage [email or web address] Password [email or web address] 06/12/2020, 1:14 PM

## 2020-06-12 NOTE — Plan of Care (Signed)
  Problem: Safety: Goal: Ability to remain free from injury will improve Outcome: Progressing   

## 2020-06-12 NOTE — TOC Progression Note (Addendum)
Transition of Care [facility]) - Progression Note    Patient Details  Name: [name] MRN: [id number] Date of Birth: 01-12-1918  Transition of Care [place]) CM/SW [place], [place] Phone Number: 06/12/2020, 8:13 AM  Clinical Narrative:    11:45a Patient's son spoke with MD and expressed he would like patient to return to [facility] at discharge. MD ordered coivd test.  9:20a CSW confirmed with [facility] that patient has to be out of mittens and without a sitter 48 hours prior to discharge.  8:10a CSW returned missed phone call from patient's son Acel. Patient's son stated prior to being hospitalized, patient was due to be discharged from [facility]. Son asked why patient should return to SNF. CSW went over PT recommendation with patient's son, based on hospital assessment. Patient's son expressed understanding and is still in agreement with SNF placement.  CSW aware patient was in mittens within the last 24 hours and contacted [facility] to inquire on their policy. CSW awaiting a callback.   Expected Discharge Plan: [place] Barriers to Discharge: No Barriers Identified  Expected Discharge Plan and Services Expected Discharge Plan: [place]: [place] arrangements for the past 2 months: [place]                                       Social Determinants of Health (SDOH) Interventions    Readmission Risk Interventions No flowsheet data found.

## 2020-06-12 NOTE — Progress Notes (Signed)
Patient continues to pull at foley cather, there is bleeding from the [place]. MD has been notified there are new orders at this time

## 2020-06-12 NOTE — Progress Notes (Signed)
Patient pulled majority of foley catheter out and had some amount of blood in urine, around penis and on patient's hands on assessement. Opy[place], MD made aware and ordered to insert another foley catheter. Patient tolerated procedure well and had bleeding from urethra after insertion. Will continue to monitor.    Shigeo Baugh,RN.

## 2020-06-13 NOTE — Discharge Instructions (Signed)
Acute Urinary Retention, Male  Acute urinary retention means that you cannot pee (urinate) at all, or that you pee too little and your bladder is not emptied completely. If it is not treated, it can lead to kidney damage or other serious problems. Follow these instructions at home:  Take over-the-counter and prescription medicines only as told by your doctor. Ask your doctor what medicines you should stay away from. Do not take any medicine unless your doctor says it is okay to do so.  If you were sent home with a tube that drains the bladder (catheter), take care of it as told by your doctor.  Drink enough fluid to keep your pee clear or pale yellow.  If you were given an antibiotic, take it as told by your doctor. Do not stop taking the antibiotic even if you start to feel better.  Do not use any products that contain nicotine or tobacco, such as cigarettes and e-cigarettes. If you need help quitting, ask your doctor.  Watch for changes in your symptoms. Tell your doctor about them.  If told, track changes in your blood pressure at home. Tell your doctor about them.  Keep all follow-up visits as told by your doctor. This is important. Contact a doctor if:  You have spasms or you leak pee when you have spasms. Get help right away if:  You have chills or a fever.  You have a tube that drains the bladder and: ? The tube stops draining pee. ? The tube falls out.  You have blood in your pee. Summary  Acute urinary retention means that you have problems peeing. It may mean that you cannot pee at all, or that you pee too little.  If this condition is not treated, it can lead to kidney damage or other serious problems.  If you were sent home with a tube that drains the bladder, take care of it as told by your doctor.  Monitor any changes in your symptoms. Tell your doctor about any changes. This information is not intended to replace advice given to you by your health care  provider. Make sure you discuss any questions you have with your health care provider. Document Revised: 12/18/2018 Document Reviewed: 11/02/2016 Elsevier Patient Education  [place].   Acute Kidney Injury, Adult  Acute kidney injury is a sudden worsening of kidney function. The kidneys are organs that have several jobs. They filter the blood to remove waste products and extra fluid. They also maintain a healthy balance of minerals and hormones in the body, which helps control blood pressure and keep bones strong. With this condition, your kidneys do not do their jobs as well as they should. This condition ranges from mild to severe. Over time it may develop into long-lasting (chronic) kidney disease. Early detection and treatment may prevent acute kidney injury from developing into a chronic condition. What are the causes? Common causes of this condition include:  A problem with blood flow to the kidneys. This may be caused by: ? Low blood pressure (hypotension) or shock. ? Blood loss. ? Heart and blood vessel (cardiovascular) disease. ? Severe burns. ? Liver disease.  Direct damage to the kidneys. This may be caused by: ? Certain medicines. ? A kidney infection. ? Poisoning. ? Being around or in contact with toxic substances. ? A surgical wound. ? A hard, direct hit to the kidney area.  A sudden blockage of urine flow. This may be caused by: ? Cancer. ? Kidney  stones. ? An enlarged prostate in males. What are the signs or symptoms? Symptoms of this condition may not be obvious until the condition becomes severe. Symptoms of this condition can include:  Tiredness (lethargy), or difficulty staying awake.  Nausea or vomiting.  Swelling (edema) of the face, legs, ankles, or feet.  Problems with urination, such as: ? Abdominal pain, or pain along the side of your stomach (flank). ? Decreased urine production. ? Decrease in the force of urine flow.  Muscle twitches  and cramps, especially in the legs.  Confusion or trouble concentrating.  Loss of appetite.  Fever. How is this diagnosed? This condition may be diagnosed with tests, including:  Blood tests.  Urine tests.  Imaging tests.  A test in which a sample of tissue is removed from the kidneys to be examined under a microscope (kidney biopsy). How is this treated? Treatment for this condition depends on the cause and how severe the condition is. In mild cases, treatment may not be needed. The kidneys may heal on their own. In more severe cases, treatment will involve:  Treating the cause of the kidney injury. This may involve changing any medicines you are taking or adjusting your dosage.  Fluids. You may need specialized IV fluids to balance your body's needs.  Having a catheter placed to drain urine and prevent blockages.  Preventing problems from occurring. This may mean avoiding certain medicines or procedures that can cause further injury to the kidneys. In some cases treatment may also require:  A procedure to remove toxic wastes from the body (dialysis or continuous renal replacement therapy - CRRT).  Surgery. This may be done to repair a torn kidney, or to remove the blockage from the urinary system. Follow these instructions at home: Medicines  Take over-the-counter and prescription medicines only as told by your health care provider.  Do not take any new medicines without your health care provider's approval. Many medicines can worsen your kidney damage.  Do not take any vitamin and mineral supplements without your health care provider's approval. Many nutritional supplements can worsen your kidney damage. Lifestyle  If your health care provider prescribed changes to your diet, follow them. You may need to decrease the amount of protein you eat.  Achieve and maintain a healthy weight. If you need help with this, ask your health care provider.  Start or continue an  exercise plan. Try to exercise at least 30 minutes a day, 5 days a week.  Do not use any tobacco products, such as cigarettes, chewing tobacco, and e-cigarettes. If you need help quitting, ask your health care provider. General instructions  Keep track of your blood pressure. Report changes in your blood pressure as told by your health care provider.  Stay up to date with immunizations. Ask your health care provider which immunizations you need.  Keep all follow-up visits as told by your health care provider. This is important. Where to find more information  American Association of Kidney Patients: BombTimer.gl  National Kidney Foundation: www.kidney.[place]: https://mathis.com/  Life Options Rehabilitation Program: ? www.lifeoptions.org ? www.kidneyschool.org Contact a health care provider if:  Your symptoms get worse.  You develop new symptoms. Get help right away if:  You develop symptoms of worsening kidney disease, which include: ? Headaches. ? Abnormally dark or light skin. ? Easy bruising. ? Frequent hiccups. ? Chest pain. ? Shortness of breath. ? End of menstruation in women. ? Seizures. ? Confusion or altered mental status. ?  Abdominal or back pain. ? Itchiness.  You have a fever.  Your body is producing less urine.  You have pain or bleeding when you urinate. Summary  Acute kidney injury is a sudden worsening of kidney function.  Acute kidney injury can be caused by problems with blood flow to the kidneys, direct damage to the kidneys, and sudden blockage of urine flow.  Symptoms of this condition may not be obvious until it becomes severe. Symptoms may include edema, lethargy, confusion, nausea or vomiting, and problems passing urine.  This condition can usually be diagnosed with blood tests, urine tests, and imaging tests. Sometimes a kidney biopsy is done to diagnose this condition.  Treatment for this condition often involves  treating the underlying cause. It is treated with fluids, medicines, dialysis, diet changes, or surgery. This information is not intended to replace advice given to you by your health care provider. Make sure you discuss any questions you have with your health care provider. Document Revised: 09/13/2017 Document Reviewed: 09/21/2016 Elsevier Patient Education  2020 [employer].

## 2020-06-13 NOTE — Progress Notes (Signed)
Patient's COVID-19 PCR test done on 8/29 was positive. Patient had prior positive COVID-19 test on 8/1. Infection Prevention advised that patient does not have be placed on airborne and contact precautions as patient is outside of infectious window. Dr. Myna Hidalgo, notified via [facility] about test results. Will continue to monitor.   Deven Furia,RN.

## 2020-06-13 NOTE — Progress Notes (Signed)
PROGRESS NOTE  [name] KZS:[id number] DOB: [birth year] DOA: 06/09/2020 PCP: Lajean Manes, MD  HPI/Recap of past 24 hours: DDU:[id number] C [name] a 86 y.o.malewith medical history significant for permanent atrial fibrillation not on oral anticoagulation, bone lytic lesion, hypertension, CAD/CABG, SBO, SDH who is coming from [facility] due to lethargy, decreased oral intake, urinary retention of 900 mL and altered mental status.  ED Course: Imaging: Abdomen and chest radiographs shows small pleural effusions or pleural thickening. There was tensive bilateral calcified pleural plaques. There was nonobstructive gas pattern. CT head shows thin left subdural collection 2 to 3 mm which is basically unchanged from prior exam. No mass affect or midline shift. There is atrophy and chronic small vessel changes. CT renal study did not show hydronephrosis or nephrolithiasis. There was mildly thickened and trabeculated appearance of the bladder wall. This is likely related to chronic bladder outlet obstruction. UA negative for pyuria.  Admitted for AKI likely prerenal in the setting of dehydration, hypovolemic on exam.  Hospital course complicated by 2 episodes of confusion in which he pulled his Foley catheter resulting in mild bleeding.  Foley catheter was inserted due to acute urinary retention.  Foley discontinued on 06/12/2020 for patient's own safety.  We will proceed with straight in and out catheter.  06/13/20: Seen and examined.  He is calm and pleasantly confused.  Foley catheter has been removed.  No new complaints.  No sitter at bedside.  Assessment/Plan: Principal Problem:   AKI (acute kidney injury) ([place]) Active Problems:   Essential hypertension, benign   Subdural hematoma (HCC)   Atrial fibrillation with RVR (HCC)   Pressure injury of skin   Hypernatremia  Resolving AKI on CKD 3 a (acute kidney injury) ([place]) likely secondary to prerenal azotemia from  dehydration Presented with creatinine of 3.0 Received IV fluid with improvement of his creatinine down to 1.2 Baseline creatinine appears to be 1.1  with GFR 52 Continue to avoid nephrotoxins and dehydration Continue to monitor urine output  Acute urinary retention, now post Foley catheter placement on 06/10/2020 and removal on 06/12/20 2 episodes of confusion in which he pulled his Foley catheter resulting in mild bleeding.  Foley catheter was inserted due to acute urinary retention.  Foley discontinued on 06/12/2020 for patient's own safety.  We will proceed with straight in and out catheter. Tamsulosin added, continue. Will need to arrange for urology follow up outpatient.  Hypovolemic hypernatremia Improved with gentle IV fluid hydration half-normal saline Now off IV fluid, encourage oral intake Feeding assistance ordered  Acute metabolic encephalopathy, suspect secondary to dehydration  Reorient as needed  Essential hypertension BP is at goal Continue metoprolol 50 mg p.o. twice daily and diltiazem Furosemide has been held due to presenting dehydration and hypovolemia.  Subdural hematoma (HCC) CT head done on 06/09/2020 No significant change   Permanent Atrial fibrillation(HCC) CHA?DS?-VASc Score of at least 4. Not on anticoagulation due to history of ICH. Rate controlled Continue metoprolol and p.o. diltiazem for rate control.    Pressure injury of skin Continue local wound care  Hx of covid 19 viral infection Positive covid 19 test on 05/15/20 and 06/12/20 No need for airborne/droplet/contact precaution per infection control    DVT prophylaxis:      SCDs. Code Status:              DNR. Family Communication: Disposition Plan:              Patient is from:  [facility]             [facility]tic[facility]ated DC to:                   [facility].             Anticipated DC date:               06/14/2020.             Anticipated DC  barriers:         SNF placement       Consults called:         None.   Status is: Inpatient   Dispo:  Patient From: [place]  Planned Disposition: [place]  Expected discharge date: 06/14/20  Medically stable for discharge: Yes         Objective: Vitals:   06/13/20 0500 06/13/20 0828 06/13/20 1256 06/13/20 1555  BP: 107/66 115/69  108/62  Pulse: 73 75  80  Resp: 15 16  15   Temp: 98.2 F (36.8 C) 98 F (36.7 C)  97.8 F (36.6 C)  TempSrc:  Oral  Oral  SpO2: 93% 96% 98% 97%    Intake/Output Summary (Last 24 hours) at 06/13/2020 1719 Last data filed at 06/13/2020 1524 Gross per 24 hour  Intake 520 ml  Output 650 ml  Net -130 ml   There were no vitals filed for this visit.  Exam:  . General: 86 y.o. year-old male pleasant in no acute distress.  Alert and pleasantly confused. . Cardiovascular: Irregular rate and rhythm no rubs or gallops. [name] Respiratory: Clear to auscultation no wheeze or rales.   . Abdomen: Soft nontender normal bowel sounds present.   . Musculoskeletal: No lower extremity edema bilaterally.   [name] Psychiatry: Mood is appropriate for condition and setting.   Data Reviewed: CBC: Recent Labs  Lab 06/09/20 2313 06/09/20 2327 06/09/20 2330 06/10/20 0550 06/11/20 0359  WBC 6.8  --   --  6.2 5.3  NEUTROABS  --   --   --   --  4.0  HGB 11.9* 12.2* 12.6* 11.8* 11.2*  HCT 38.9* 36.0* 37.0* 38.2* 34.9*  MCV 97.3  --   --  97.2 97.8  PLT 211  --   --  184 315   Basic Metabolic Panel: Recent Labs  Lab 06/09/20 2313 06/09/20 2327 06/09/20 2330 06/10/20 0550 06/11/20 0359  NA 147* 146* 145 148* 145  K 4.1 3.8 3.8 3.3* 3.7  CL 101  --  100 103 108  CO2 33*  --   --  30 28  GLUCOSE 122*  --  120* 99 106*  BUN 64*  --  61* 57* 34*  CREATININE 3.00*  --  2.70* 2.36* 1.21  CALCIUM 8.8*  --   --  8.4* 8.3*   GFR: CrCl cannot be calculated (Unknown ideal weight.). Liver Function Tests: Recent Labs  Lab  06/09/20 2313 06/10/20 0550  AST 14* 15  ALT 10 10  ALKPHOS 52 48  BILITOT 0.9 0.5  PROT 6.4* 6.1*  ALBUMIN 2.6* 2.4*   No results for input(s): LIPASE, AMYLASE in the last 168 hours. Recent Labs  Lab 06/09/20 2315  AMMONIA 17   Coagulation Profile: Recent Labs  Lab 06/09/20 2311  INR 1.1   Cardiac Enzymes: No results for input(s): CKTOTAL, CKMB, CKMBINDEX, TROPONINI in the last 168 hours. BNP (last 3 results) No results for input(s): PROBNP in  the last 8760 hours. HbA1C: No results for input(s): HGBA1C in the last 72 hours. CBG: Recent Labs  Lab 06/09/20 2302  GLUCAP 109*   Lipid Profile: No results for input(s): CHOL, HDL, LDLCALC, TRIG, CHOLHDL, LDLDIRECT in the last 72 hours. Thyroid Function Tests: No results for input(s): TSH, T4TOTAL, FREET4, T3FREE, THYROIDAB in the last 72 hours. Anemia Panel: No results for input(s): VITAMINB12, FOLATE, FERRITIN, TIBC, IRON, RETICCTPCT in the last 72 hours. Urine analysis:    Component Value Date/Time   COLORURINE AMBER (A) 06/10/2020 0120   APPEARANCEUR CLOUDY (A) 06/10/2020 0120   LABSPEC 1.016 06/10/2020 0120   PHURINE 5.0 06/10/2020 0120   GLUCOSEU NEGATIVE 06/10/2020 0120   HGBUR LARGE (A) 06/10/2020 0120   BILIRUBINUR NEGATIVE 06/10/2020 0120   KETONESUR NEGATIVE 06/10/2020 0120   PROTEINUR 100 (A) 06/10/2020 0120   UROBILINOGEN 1.0 10/17/2013 1533   NITRITE NEGATIVE 06/10/2020 0120   LEUKOCYTESUR SMALL (A) 06/10/2020 0120   Sepsis Labs: @LABRCNTIP (procalcitonin:4,lacticidven:4)  ) Recent Results (from the past 240 hour(s))  Blood culture (routine single)     Status: None (Preliminary result)   Collection Time: 06/10/20 12:20 AM   Specimen: BLOOD RIGHT FOREARM  Result Value Ref Range Status   Specimen Description BLOOD RIGHT FOREARM  Final   Special Requests   Final    BOTTLES DRAWN AEROBIC AND ANAEROBIC Blood Culture adequate volume   Culture   Final    NO GROWTH 3 DAYS Performed at [facility], [place] [address], [place], [place] [address]    Report Status PENDING  Incomplete  Urine culture     Status: None   Collection Time: 06/10/20  1:22 AM   Specimen: In/Out Cath Urine  Result Value Ref Range Status   Specimen Description IN/OUT CATH URINE  Final   Special Requests NONE  Final   Culture   Final    NO GROWTH Performed at [facility], [place] [address] [place], [place] [address]    Report Status 06/10/2020 FINAL  Final  Culture, blood (single)     Status: None (Preliminary result)   Collection Time: 06/10/20  3:28 AM   Specimen: BLOOD  Result Value Ref Range Status   Specimen Description BLOOD RIGHT ANTECUBITAL  Final   Special Requests   Final    BOTTLES DRAWN AEROBIC AND ANAEROBIC Blood Culture results may not be optimal due to an inadequate volume of blood received in culture bottles   Culture   Final    NO GROWTH 3 DAYS Performed at [facility], [place] [address] [place], [place] [address]    Report Status PENDING  Incomplete  SARS Coronavirus 2 by RT PCR (hospital order, performed in [facility] hospital lab) Nasopharyngeal Nasopharyngeal Swab     Status: Abnormal   Collection Time: 06/12/20  8:05 PM   Specimen: Nasopharyngeal Swab  Result Value Ref Range Status   SARS Coronavirus 2 POSITIVE (A) NEGATIVE Final    Comment: RESULT CALLED TO, READ BACK BY AND VERIFIED WITH: S. FUKUO,RN 2119 06/12/2020 T. TYSOR (NOTE) SARS-CoV-2 target nucleic acids are DETECTED  SARS-CoV-2 RNA is generally detectable in upper respiratory specimens  during the acute phase of infection.  Positive results are indicative  of the presence of the identified virus, but do not rule out bacterial infection or co-infection with other pathogens not detected by the test.  Clinical correlation with patient history and  other diagnostic information is necessary to determine patient infection status.  The expected result is negative.  Fact Sheet for Patients:    StrictlyIdeas.no   Fact Sheet for Healthcare Providers:   BankingDealers.co.za    This test is not yet approved or cleared by the [place] FDA and  has been authorized for detection and/or diagnosis of SARS-CoV-2 by FDA under an Emergency Use Authorization (EUA).  This EUA will remain in effect (meaning this  test can be used) for the duration of  the COVID-19 declaration under Section 564(b)(1) of the Act, 21 U.S.C. section 360-bbb-3(b)(1), unless the authorization is terminated or revoked sooner.  Performed at [facility], [place] [address], [place], [place] [address]       Studies: No results found.  Scheduled Meds: . Chlorhexidine Gluconate Cloth  6 each Topical Daily  . diltiazem  240 mg Oral Daily  . metoprolol tartrate  50 mg Oral BID  . tamsulosin  0.4 mg Oral Daily    Continuous Infusions:   LOS: 3 days     Kayleen Memos, MD [facility] Pager [phone]  If 7PM-7AM, please contact night-coverage [email or web address] Password [email or web address] 06/13/2020, 5:19 PM

## 2020-06-13 NOTE — Consult Note (Signed)
   [facility] Inpatient Consult   06/13/2020  [name] [birth year] [id number]   [place] [facility] [ACO] Patient: [name] [facility]  Patient screened for less than 30 day readmission and for hospitalizations to check if potential [place] [facility] service needs.  Review of patient's medical record reveals patient is recommending to return to a skilled nursing level of care.  Primary Care Provider is  Lajean Manes, MD this provider is listed to provide the transition of care [TOC] for post hospital follow up.  Current disposition is not completed.  Plan: Continue to follow for progressand disposition to assess for post hospital care management needs. Patient needs to be met at the skilled nursing level of care.   For questions contact:   Natividad Brood, RN BSN [facility] Liaison  [phone] business mobile phone Toll free office [phone]  Fax number: [phone] [email or web address]

## 2020-06-13 NOTE — TOC Progression Note (Addendum)
Transition of Care [facility]) - Progression Note    Patient Details  Name: [name] MRN: [id number] Date of Birth: 05/11/1918  Transition of Care [facility]) CM/SW Contact  Sharlet Salina Mila Homer, LCSW Phone Number: 06/13/2020, 5:31 PM  Clinical Narrative:  CSW received a call from [facility], admissions director at [facility] regarding patient. Patient is from [facility] and came to hospital from [facility]. Per Claiborne Billings, son wants to see if [facility] can take patient. Patient currently testing positive for COVID. CSW advised that patient went to [facility] after getting COVID and they will take him back. Per Annabelle [facility] only has one non-certified bed in a semi-private room (LTC private pay) that patient cannot occupy. CSW advised that son is in [place] at time, from [place] and would like updates.    5:37 pm - Call made to son [name] [phone]) and message left.    Expected Discharge Plan: [place] Barriers to Discharge: No Barriers Identified  Expected Discharge Plan and Services Expected Discharge Plan: [place] Choice: [place] arrangements for the past 2 months: [place]                                       Social Determinants of Health (SDOH) Interventions    Readmission Risk Interventions No flowsheet data found.

## 2020-06-13 NOTE — Progress Notes (Signed)
Physical Therapy Treatment Patient Details Name: [name] MRN: [id number] DOB: Sep 08, 1918 Today's Date: 06/13/2020    History of Present Illness The pt is a 86 yo male presenting from [facility] due to lethargy, decreased PO intake, urinary retention, and AMS. Upon arrival, the pt was responsive to noxious stimuli only, CT on 8/26 showed no acute changes, chronic L subdural collection (2-3 mm). CT of chest revealed small pleural effusions or pleural thickenings. PMH includes: afib, HTN, CAD s/p CABG, SBO, bone lytic lesion, and SDH.    PT Comments    The pt was in bed upon arrival of PT, agreeable to session with focus on progression of ambulation. However, the pt required significantly more assist today than in prior sessions and was symptomatic with orthostatic hypotension with attempts to progress functional mobility to recliner (BP recorded below). The pt fatigued quickly this morning and was limited to transferring to the recliner. The pt reported resolution of all sx when reclined in the recliner with legs elevated, and was left with all needs met and chair alarm activated. The pt will continue to benefit from skilled PT to progress functional strength, power, endurance, and activity tolerance prior to d/c.    VITALS:  - supine - BP: 118/73 (87); HR: 94bpm - sitting EOB - BP: 100/60; HR: 81bpm - standing - BP: 75/50 (59); HR: 84bpm - sitting in recliner feet in dependent position - BP: 69/49; HR: 82bpm - sitting in recliner feet elevated - 91/61 (70); HR: 79bpm   Follow Up Recommendations  SNF     Equipment Recommendations  Other (comment) (defer to post acute)    Recommendations for Other Services       Precautions / Restrictions Precautions Precautions: Fall Precaution Comments: orthostatic Restrictions Weight Bearing Restrictions: No    Mobility  Bed Mobility Overal bed mobility: Needs Assistance Bed Mobility: Supine to Sit     Supine to sit: Mod  assist;HOB elevated     General bed mobility comments: assist with BLE and to elevate trunk, increased time, use of bed pad to scoot to EOB, +rail  Transfers Overall transfer level: Needs assistance Equipment used: Rolling walker (2 wheeled) Transfers: Sit to/from [facility] Sit to Stand: Mod assist;+2 physical assistance;From elevated surface Stand pivot transfers: Mod assist;+2 physical assistance       General transfer comment: assist to power up and steady, pt keeps head down, trunk flexed despite cues  Ambulation/Gait Ambulation/Gait assistance: Mod assist;+2 physical assistance Gait Distance (Feet): 3 Feet Assistive device: Rolling walker (2 wheeled) Gait Pattern/deviations: Step-to pattern;Decreased stride length;Shuffle;Trunk flexed Gait velocity: decreased   General Gait Details: pt with short, unsteady lateral steps with modA to maintain balance, minA to manage RW movement.      Balance Overall balance assessment: Needs assistance Sitting-balance support: Feet supported;Single extremity supported Sitting balance-Leahy Scale: Poor Sitting balance - Comments: Patient relying on at least single extremity support. modA from therapist due to pt reports of dizziness/fatige in sitting Postural control: Posterior lean Standing balance support: Bilateral upper extremity supported;During functional activity Standing balance-Leahy Scale: Poor Standing balance comment: heavy reliance on external support                            Cognition Arousal/Alertness: Lethargic Behavior During Therapy: WFL for tasks assessed/performed Overall Cognitive Status: No family/caregiver present to determine baseline cognitive functioning Area of Impairment: Orientation;Attention;Memory;Safety/judgement;Awareness;Problem solving;Following commands  Orientation Level: Disoriented to;Place;Time;Situation Current Attention Level:  Selective Memory: Decreased short-term memory Following Commands: Follows one step commands consistently;Follows one step commands with increased time Safety/Judgement: Decreased awareness of safety;Decreased awareness of deficits Awareness: Intellectual Problem Solving: Difficulty sequencing;Requires verbal cues;Slow processing;Decreased initiation General Comments: Patient able to follow one step commands though with increased time.  Not oriented and poor STM.  Difficulty sequencing mobility steps. makes statements such as "I don't know what is going on, but you're the boss" when asked if session plan is okay with the pt      Exercises General Exercises - Lower Extremity Ankle Circles/Pumps: AROM;Both;10 reps;Supine Heel Slides: AROM;Both;10 reps;Supine    General Comments General comments (skin integrity, edema, etc.): Pt orthostatic today, but stabilized in recliner before PT left. RN alerted      Pertinent Vitals/Pain Pain Assessment: No/denies pain Pain Intervention(s): Repositioned;Monitored during session;Limited activity within patient's tolerance           PT Goals (current goals can now be found in the care plan section) Acute Rehab PT Goals Patient Stated Goal: to get more comfortable PT Goal Formulation: Patient unable to participate in goal setting Time For Goal Achievement: 06/25/20 Potential to Achieve Goals: Good Progress towards PT goals: Progressing toward goals    Frequency    Min 2X/week      PT Plan Current plan remains appropriate       AM-PAC PT "6 Clicks" Mobility   Outcome Measure  Help needed turning from your back to your side while in a flat bed without using bedrails?: A Little Help needed moving from lying on your back to sitting on the side of a flat bed without using bedrails?: A Lot Help needed moving to and from a bed to a chair (including a wheelchair)?: A Lot Help needed standing up from a chair using your arms (e.g., wheelchair  or bedside chair)?: A Lot Help needed to walk in hospital room?: A Lot Help needed climbing 3-5 steps with a railing? : Total 6 Click Score: 12    End of Session Equipment Utilized During Treatment: Gait belt Activity Tolerance: Patient limited by fatigue;Other (comment) (orthostatic hypotension) Patient left: in chair;with call bell/phone within reach;with chair alarm set Nurse Communication: Mobility status PT Visit Diagnosis: Unsteadiness on feet (R26.81);Muscle weakness (generalized) (M62.81);Difficulty in walking, not elsewhere classified (R26.2)     Time: 3291-9166 PT Time Calculation (min) (ACUTE ONLY): 31 min  Charges:  $Gait Training: 8-22 mins $Therapeutic Activity: 8-22 mins                     Karma Ganja, PT, DPT   Acute Rehabilitation Department Pager #: [phone]   Otho Bellows 06/13/2020, 1:04 PM

## 2020-06-13 NOTE — Progress Notes (Signed)
  Speech Language Pathology Treatment: Dysphagia  Patient Details Name: [name] MRN: [id number] DOB: 1918/10/04 Today's Date: 06/13/2020 Time: 2549-8264 SLP Time Calculation (min) (ACUTE ONLY): 9 min  Assessment / Plan / Recommendation Clinical Impression  Pt was seen for skilled ST targeting diet tolerance and diagnostic treatment.  He was encountered awake/alert in bed and was agreeable to this session.  Pt and RN both stated that he was tolerating his current diet without difficulty.  Pt was seen with trials of regular solids (graham cracker) and thin liquid via straw sip.  Mastication was timely, and suspect timely AP transport and swallow initiation.  Facial grimace was noted x1 during mastication of regular solids, but pt denied odynophagia.  No clinical s/sx of aspiration were observed with any trials.  Recommend diet upgrade to regular solids and thin liquids with medication administered whole in puree.  Pt will benefit from supervision and assistance during all meals.  SLP will briefly f/u to monitor diet tolerance.     HPI HPI: Pt is a 86 y.o. male with medical history significant of atrial fibrillation, bone lytic lesion, hypertension, CAD/CABG, SBO, SDH who presented to the ED from [facility] due to lethargy, decreased oral intake, urinary retention of 900 mL and altered mental status. Upon arrival pt was only responsive to noxious stimuli, obtunded, and unable to answer any questions. CT head 8/26: Previous thin left subdural collection measures 2-3 mm, not significantly changed or minimally improved from prior. No acute changes. Pt was seen by speech pathology 8/2-8/6 and discharged on dysphagia 3 solids and nectar thick liquids with the recommendation for a modified barium swallow study in 10-14 days. CT chest Small pleural effusions or pleural thickening. Extensive bilateral calcified pleural plaques.       SLP Plan  Continue with current plan of care        Recommendations  Diet recommendations: Regular;Thin liquid Liquids provided via: Cup;Straw Medication Administration: Whole meds with puree Supervision: Staff to assist with self feeding;Full supervision/cueing for compensatory strategies Compensations: Small sips/bites;Slow rate Postural Changes and/or Swallow Maneuvers: Seated upright 90 degrees;Upright 30-60 min after meal                Oral Care Recommendations: Oral care BID Follow up Recommendations: Skilled Nursing facility SLP Visit Diagnosis: Dysphagia, unspecified (R13.10) Plan: Continue with current plan of care       Angel Fire., M.S., [place] Office: [phone]  [place] 06/13/2020, 9:42 AM

## 2020-06-14 LAB — BASIC METABOLIC PANEL
Anion gap: 9 (ref 5–15)
BUN: 18 mg/dL (ref 8–23)
CO2: 26 mmol/L (ref 22–32)
Calcium: 8.1 mg/dL — ABNORMAL LOW (ref 8.9–10.3)
Chloride: 103 mmol/L (ref 98–111)
Creatinine, Ser: 0.92 mg/dL (ref 0.61–1.24)
GFR calc Af Amer: >60 mL/min (ref >60)
GFR calc non Af Amer: >60 mL/min (ref >60)
Glucose, Bld: 98 mg/dL (ref 70–99)
Potassium: 3.5 mmol/L (ref 3.5–5.1)
Sodium: 138 mmol/L (ref 135–145)

## 2020-06-14 MED ORDER — TAMSULOSIN HCL 0.4 MG PO CAPS: 0.4000 mg | ORAL_CAPSULE | Freq: Every day | ORAL | 3 refills | Status: DC

## 2020-06-14 NOTE — Discharge Summary (Addendum)
Physician Discharge Summary  Trejan Buda Castleman IDP:[id number] DOB: 1918/06/25 DOA: 06/09/2020  PCP: Lajean Manes, MD  Admit date: 06/09/2020 Discharge date: 06-15-2020  Admitted From: SNF Disposition: SNF  Recommendations for Outpatient Follow-up:  1. Follow up with PCP in 1-2 weeks 2. Please obtain BMP/CBC in one week 3. Needs follow up with urology within 1 week 4. Could use mittens if patient gets confuse to avoid removal of foley catheter  5. Patient will need to be fed.  6. Check B 12.   Home Health:   Discharge Condition: stable.  CODE STATUS: Full Code, discussed with son Diet recommendation: Heart Healthy   Brief/Interim Summary: NOBORU BIDINGER a 86 y.o.malewith medical history significant for permanentatrial fibrillation not on oral anticoagulation, bone lytic lesion, hypertension, CAD/CABG, SBO, SDH who is coming from [facility] due to lethargy, decreased oral intake, urinary retention of 900 mL and altered mental status.  ED Course: Imaging: Abdomen and chest radiographs shows small pleural effusions or pleural thickening. There was tensive bilateral calcified pleural plaques. There was nonobstructive gas pattern. CT head shows thin left subdural collection 2 to 3 mm which is basically unchanged from prior exam. No mass affect or midline shift. There is atrophy and chronic small vessel changes. CT renal study did not show hydronephrosis or nephrolithiasis. There was mildly thickened and trabeculated appearance of the bladder wall. This is likely related to chronic bladder outlet obstruction. UA negative for pyuria.  Admitted for AKI likely prerenal in the setting of dehydration, hypovolemic on exam.  Hospital course complicated by 2 episodes of confusion in which he pulled his Foley catheter resulting in mild bleeding.  Foley catheter was inserted due to acute urinary retention.  Foley discontinued on 06/12/2020. Patient is less confuse. Will try to place  foley for discharge. Needs close follow up with urology.   Resolving AKI on CKD 3 a (acute kidney injury) ([place]) likely secondary to prerenal azotemia from dehydration Presented with creatinine of 3.0 Received IV fluid with improvement of his creatinine down to 1.2 Baseline creatinine appears to be 1.1 with GFR 52 Continue to avoid nephrotoxins and dehydration Resolved. Labs stable.    Acute urinary retention, now post Foley catheter placement on 06/10/2020 and removal on 06/12/20 2 episodes of confusion in which he pulled his Foley catheter resulting in mild bleeding.  Foley catheter was inserted due to acute urinary retention.  Foley discontinued on 06/12/2020.  Plan to insert foley catheter, now that he is less confuse. To avoid recurrent recurrent I and O cath.  Tamsulosin added, continue. Needs follow up with urology.   Hypovolemic hypernatremia Improved with gentle IV fluid hydration half-normal saline Now off IV fluid, encourage oral intake Needs Feeding assistance  Acute metabolic encephalopathy, suspect secondary to dehydration  Improved.  Delirium precaution.   Essential hypertension BP is at goal Continue metoprolol 50 mg p.o. twice daily and diltiazem Furosemide has been held due to presenting dehydration and hypovolemia.  Subdural hematoma (HCC) CT head done on 06/09/2020 No significant change  Permanent Atrial fibrillation(HCC) CHA?DS?-VASc Scoreof at least 4. Not on anticoagulation due to history of ICH. Rate controlled Continue metoprolol and p.o. diltiazem for rate control.  Pressure injury of skin Continue local wound care  Hx of covid 19 viral infection Positive covid 19 test on 05/15/20 and 06/12/20 No need for airborne/droplet/contact precaution per infection control   Discharge Diagnoses:  Principal Problem:   AKI (acute kidney injury) ([place]) Active Problems:   Essential hypertension, benign  Subdural hematoma (HCC)   Atrial  fibrillation with RVR (HCC)   Pressure injury of skin   Hypernatremia    Discharge Instructions  Discharge Instructions    Diet - low sodium heart healthy   Complete by: As directed    Discharge wound care:   Complete by: As directed    See above   Increase activity slowly   Complete by: As directed      Allergies as of 06/14/2020      Reactions   Norvasc [amlodipine Besylate] Other (See Comments)   Weakness   Symmetrel [amantadine] Cough   Hydrochlorothiazide Palpitations      Medication List    TAKE these medications   acetaminophen 325 MG tablet Commonly known as: TYLENOL Take 2 tablets (650 mg total) by mouth every 6 (six) hours as needed for mild pain or headache (fever >/= 101).   Dilt-XR 240 MG 24 hr capsule Generic drug: diltiazem Take 240 mg by mouth every morning.   feeding supplement (ENSURE ENLIVE) Liqd Take 237 mLs by mouth 3 (three) times daily between meals.   furosemide 40 MG tablet Commonly known as: Lasix Take 1 tablet (40 mg total) by mouth daily as needed for fluid or edema (weight gain 3 lbs in 2 days or 5 lbs in 5 days.).   metoprolol tartrate 50 MG tablet Commonly known as: LOPRESSOR Take 1 tablet (50 mg total) by mouth 2 (two) times daily.   multivitamin tablet Take 1 tablet by mouth daily.   tamsulosin 0.4 MG Caps capsule Commonly known as: FLOMAX Take 1 capsule (0.4 mg total) by mouth daily. Start taking on: June 15, 2020            Discharge Care Instructions  (From admission, onward)         Start     Ordered   06/14/20 0000  Discharge wound care:       Comments: See above   06/14/20 1241          Follow-up Information    Robley Fries, MD. Call in 1 day(s).   Specialty: Urology Why: Please call for a post hospital follow up appointment Contact information: [place] [address] [phone]        Lajean Manes, MD. Call in 1 day(s).   Specialty: Internal Medicine Why:  Please call for a post hospital follow up appointment Contact information: [address] [place] [address] [phone]              Allergies  Allergen Reactions  . Norvasc [Amlodipine Besylate] Other (See Comments)    Weakness   . Symmetrel [Amantadine] Cough  . Hydrochlorothiazide Palpitations    Consultations: None  Procedures/Studies: CT Head Wo Contrast  Result Date: 06/09/2020 CLINICAL DATA:  86 year old with mental status change.  Lethargy. EXAM: CT HEAD WITHOUT CONTRAST TECHNIQUE: Contiguous axial images were obtained from the base of the skull through the vertex without intravenous contrast. COMPARISON:  Head CT 05/15/2020 FINDINGS: Brain: Previous thin left subdural collection measures 2-3 mm, not significantly changed or minimally improved1 from prior. No hyperdense component to suggest acute subdural hemorrhage. There is no mass effect or midline shift. No evidence of acute or new hemorrhage. Stable degree of atrophy and chronic small vessel ischemia. No evidence of acute ischemia. Vascular: Atherosclerosis of skullbase vasculature without hyperdense vessel or abnormal calcification. Skull: No fracture or focal lesion. Sinuses/Orbits: Bilateral cataract resection.  No acute findings. Other:  None. IMPRESSION: 1. Previous thin left subdural collection measures 2-3 mm, not significantly changed or minimally improved from prior. No hyperdense component to suggest acute subdural hemorrhage. No mass effect or midline shift. 2. Stable atrophy and chronic small vessel ischemia. Electronically Signed   By: Keith Rake M.D.   On: 06/09/2020 23:58   CT Head Wo Contrast  Addendum Date: 05/15/2020   ADDENDUM REPORT: 05/15/2020 23:10 ADDENDUM: These results were called by telephone at the time of interpretation on 05/15/2020 at 11:10 pm to provider Dr. Marlowe Sax, Who verbally acknowledged these results. Electronically Signed   By: Kellie Simmering DO   On: 05/15/2020  23:10   Result Date: 05/15/2020 CLINICAL DATA:  Mental status change, unknown cause; altered mental status. Additional provided: COVID positive. EXAM: CT HEAD WITHOUT CONTRAST TECHNIQUE: Contiguous axial images were obtained from the base of the skull through the vertex without intravenous contrast. COMPARISON:  CT head 07/03/2018 FINDINGS: Brain: Stable, moderate generalized parenchymal atrophy. Stable, moderate patchy hypoattenuation within the cerebral white matter is nonspecific, but consistent with chronic small vessel ischemic disease. There is a trace subdural collection overlying the left cerebral hemisphere measuring 3 mm in thickness, which is predominantly intermediate to low density. However, there is a 4 mm curvilinear focus of hyperdensity within this collection overlying the left frontoparietal convexity which may reflect a tiny focus of acute subdural hemorrhage (series 5, image 44). No demarcated cortical infarct. No evidence of intracranial mass. No midline shift. Vascular: No hyperdense vessel.  Atherosclerotic calcifications Skull: Normal. Negative for fracture or focal lesion. Sinuses/Orbits: Visualized orbits show no acute finding. Mild ethmoid sinus mucosal thickening. No significant mastoid effusion. IMPRESSION: Trace subdural collection overlying left cerebral hemisphere measuring up to 3 mm in thickness. This collection is predominantly intermediate to low density with these components likely reflecting a subacute to chronic subdural hematoma. However, there is a 4 mm curvilinear focus of hyperdensity within this collection overlying the left frontoparietal convexity, which may reflect a tiny focus of acute subdural hemorrhage (versus dural thickening/calcification). Stable moderate generalized parenchymal atrophy and chronic small vessel ischemic disease. Mild ethmoid sinus mucosal thickening. Electronically Signed: By: Kellie Simmering DO On: 05/15/2020 22:41   US RENAL  Result Date:  05/15/2020 CLINICAL DATA:  Acute kidney injury EXAM: RENAL / URINARY TRACT ULTRASOUND COMPLETE COMPARISON:  CT abdomen pelvis 01/09/2019 FINDINGS: Right Kidney: Renal measurements: 10.2 x 4.8 x 5.2 cm = volume: 133.4 mL . Echogenicity is within normal limits. No concerning renal mass, shadowing calculus or hydronephrosis. Left Kidney: Renal measurements: 9.9 x 5.0 x 5.9 cm = volume: 150.1 mL. Echogenicity is within normal limits. Simple appearing anechoic partially exophytic cyst at the inferior pole left kidney corresponding well to the prior CT. No concerning renal mass, shadowing calculus or hydronephrosis. Bladder: Partially decompressed at the time of examination with heterogeneous dependently layering bladder debris and some mild bladder wall thickening with granulation. Other: Prostatomegaly with the prostate measuring 5.1 x 4.6 x 5.5 cm with an estimated volume of 67 mL. IMPRESSION: Mild bladder wall thickening and debris with mural crenulation and indentation of the bladder base by an enlarged prostate. Findings could reflect sequela of chronic outlet obstruction though should correlate with urinalysis results to exclude an acute cystitis. Simple appearing left lower pole renal cyst. Otherwise unremarkable appearance of the kidneys. Electronically Signed   By: Lovena Le M.D.   On: 05/15/2020 20:28   DG Chest Portable 1 View  Result Date: 05/15/2020 CLINICAL DATA:  COVID-19 positive  EXAM: PORTABLE CHEST 1 VIEW COMPARISON:  Radiograph 01/09/2019, CT 08/08/2015 FINDINGS: No new consolidative opacity is present however the extensive partially calcified pleural plaque seen on multiple prior comparison imaging may obscure detection of underlying airspace disease. Chronic blunting of the left costophrenic sulcus is present compatible with chronic effusion seen on priors. Stable cardiomegaly with a calcified, tortuous aorta as well as features of prior sternotomy and CABG as well as placement of the left  atrial occluder device. Dense coronary artery calcifications as well as coronary stenting is noted. No acute osseous or soft tissue abnormality. Telemetry leads overlie the chest. IMPRESSION: 1. No new consolidative opacity however the extensive partially calcified pleural plaque may obscure detection of underlying airspace disease. 2. Chronic left pleural effusion. 3. Stable cardiomegaly and postsurgical changes. Electronically Signed   By: Lovena Le M.D.   On: 05/15/2020 16:24   DG ABD ACUTE 2+V W 1V CHEST  Result Date: 06/09/2020 CLINICAL DATA:  Altered mental status EXAM: DG ABDOMEN ACUTE W/ 1V CHEST COMPARISON:  08/16/2010, 05/15/2020 FINDINGS: Single-view chest demonstrates post sternotomy changes and atrial appendage clip. Small pleural effusions or pleural thickening. Extensive bilateral calcified pleural plaques. Stable cardiomediastinal silhouette with aortic atherosclerosis. Supine and upright views of the abdomen demonstrate a nonobstructed gas pattern. No radiopaque calculi are seen. Prominent degenerative changes of the spine. IMPRESSION: 1. Small pleural effusions or pleural thickening. Extensive bilateral calcified pleural plaques. 2. Nonobstructed gas pattern. Electronically Signed   By: Donavan Foil M.D.   On: 06/09/2020 23:46   CT Renal Stone Study  Result Date: 06/10/2020 CLINICAL DATA:  86 year old male with hematuria. EXAM: CT ABDOMEN AND PELVIS WITHOUT CONTRAST TECHNIQUE: Multidetector CT imaging of the abdomen and pelvis was performed following the standard protocol without IV contrast. COMPARISON:  CT abdomen pelvis dated 01/09/2019. FINDINGS: Evaluation of this exam is limited in the absence of intravenous contrast. Lower chest: Partially visualized small bilateral pleural effusions with rounded atelectasis of the lung bases similar to prior CT. There is diffuse calcified pleural plaques sequela of prior asbestos exposure. Advanced 3 vessel coronary vascular calcification and  postsurgical changes of CABG. There is mild cardiomegaly. There is hypoattenuation of the cardiac blood pool suggestive of anemia. Clinical correlation is recommended. No intra-abdominal free air or free fluid. Hepatobiliary: The liver is grossly unremarkable. Small gallstones. No pericholecystic fluid or evidence of acute cholecystitis by CT. Pancreas: Unremarkable. No pancreatic ductal dilatation or surrounding inflammatory changes. Spleen: Normal in size without focal abnormality. Adrenals/Urinary Tract: The adrenal glands unremarkable. There is no hydronephrosis or nephrolithiasis on either side. There is high attenuation of the renal medulla which may represent medullary nephrocalcinosis. Indeterminate 1 cm left renal interpolar hypodense lesion is not characterized. The visualized ureters are unremarkable. The urinary bladder is decompressed around a Foley catheter. Air within the urinary bladder introduced via the catheter. There is diffuse mildly thickened and trabeculated appearance of the bladder wall, likely related to chronic bladder outlet obstruction. Stomach/Bowel: There is sigmoid diverticulosis without active inflammatory changes. There is no bowel obstruction. The appendix is normal. Vascular/Lymphatic: Advanced aortoiliac atherosclerotic disease. The IVC is unremarkable no portal venous gas. There is no adenopathy. Reproductive: Enlarged prostate gland measuring approximately 6 cm in transverse axial diameter. Other: Bilateral hydroceles with high attenuating crescentic density within the testicles, indeterminate. Testicular ultrasound may provide better evaluation on a nonemergent/outpatient basis. Musculoskeletal: There is advanced osteopenia. No acute osseous pathology. IMPRESSION: 1. No hydronephrosis or nephrolithiasis. 2. Mildly thickened and trabeculated appearance of the bladder  wall, likely related to chronic bladder outlet obstruction. Correlation with urinalysis recommended to exclude  cystitis. 3. Sigmoid diverticulosis. No bowel obstruction or active inflammation. 4.  Aortic Atherosclerosis (ICD10-I70.0). 5. Bilateral pleural effusions and bibasilar rounded atelectasis and calcified pleural plaques as seen previously. Electronically Signed   By: Anner Crete M.D.   On: 06/10/2020 02:23   US Abdomen Limited RUQ  Result Date: 05/15/2020 CLINICAL DATA:  Elevated LFTs EXAM: ULTRASOUND ABDOMEN LIMITED RIGHT UPPER QUADRANT COMPARISON:  Ultrasound 01/10/2019, CT 01/09/2019 FINDINGS: Gallbladder: Layering shadowing gallstones and biliary sludge. Gallbladder wall is borderline thickened though partially decompressed at the time of exam. No pericholecystic fluid. Sonographic Murphy sign reportedly negative. Common bile duct: Diameter: 2.3 mm, nondilated. Liver: No focal lesion identified. Within normal limits in parenchymal echogenicity. Portal vein is patent on color Doppler imaging with normal direction of blood flow towards the liver. Other: Small right pleural effusion noted incidentally. IMPRESSION: Cholelithiasis and biliary sludge Borderline gallbladder wall thickening is nonspecific given underdistention and absence of additional features of acute cholecystitis. Trace right pleural effusion. Electronically Signed   By: Lovena Le M.D.   On: 05/15/2020 20:31     Subjective: He is alert, oriented to person and place. Per nursing staff he has been less confuse.  I explain to him importance of Placing a foley catheter. Explain to him not to remove.   Discharge Exam: Vitals:   06/14/20 0504 06/14/20 0841  BP: 113/65 111/67  Pulse: 80 84  Resp: 18 18  Temp: 98.2 F (36.8 C) 97.8 F (36.6 C)  SpO2: 96% 97%     General: Pt is alert, awake, not in acute distress Cardiovascular: RRR, S1/S2 +, no rubs, no gallops Respiratory: CTA bilaterally, no wheezing, no rhonchi Abdominal: Soft, NT, ND, bowel sounds + Extremities: no edema, no cyanosis    The results of significant  diagnostics from this hospitalization (including imaging, microbiology, ancillary and laboratory) are listed below for reference.     Microbiology: Recent Results (from the past 240 hour(s))  Blood culture (routine single)     Status: None (Preliminary result)   Collection Time: 06/10/20 12:20 AM   Specimen: BLOOD RIGHT FOREARM  Result Value Ref Range Status   Specimen Description BLOOD RIGHT FOREARM  Final   Special Requests   Final    BOTTLES DRAWN AEROBIC AND ANAEROBIC Blood Culture adequate volume   Culture   Final    NO GROWTH 4 DAYS Performed at [facility], [address] [place], [place] [address]    Report Status PENDING  Incomplete  Urine culture     Status: None   Collection Time: 06/10/20  1:22 AM   Specimen: In/Out Cath Urine  Result Value Ref Range Status   Specimen Description IN/OUT CATH URINE  Final   Special Requests NONE  Final   Culture   Final    NO GROWTH Performed at [facility], [place] [address] [place], [place] [address]    Report Status 06/10/2020 FINAL  Final  Culture, blood (single)     Status: None (Preliminary result)   Collection Time: 06/10/20  3:28 AM   Specimen: BLOOD  Result Value Ref Range Status   Specimen Description BLOOD RIGHT ANTECUBITAL  Final   Special Requests   Final    BOTTLES DRAWN AEROBIC AND ANAEROBIC Blood Culture results may not be optimal due to an inadequate volume of blood received in culture bottles   Culture   Final  NO GROWTH 4 DAYS Performed at [facility], [place] [address], [place], [place] [address]    Report Status PENDING  Incomplete  SARS Coronavirus 2 by RT PCR (hospital order, performed in [facility] hospital lab) Nasopharyngeal Nasopharyngeal Swab     Status: Abnormal   Collection Time: 06/12/20  8:05 PM   Specimen: Nasopharyngeal Swab  Result Value Ref Range Status   SARS Coronavirus 2 POSITIVE (A) NEGATIVE Final    Comment: RESULT CALLED TO, READ BACK BY AND VERIFIED WITH: S.  FUKUO,RN 2119 06/12/2020 T. TYSOR (NOTE) SARS-CoV-2 target nucleic acids are DETECTED  SARS-CoV-2 RNA is generally detectable in upper respiratory specimens  during the acute phase of infection.  Positive results are indicative  of the presence of the identified virus, but do not rule out bacterial infection or co-infection with other pathogens not detected by the test.  Clinical correlation with patient history and  other diagnostic information is necessary to determine patient infection status.  The expected result is negative.  Fact Sheet for Patients:   StrictlyIdeas.no   Fact Sheet for Healthcare Providers:   BankingDealers.co.za    This test is not yet approved or cleared by the [place] FDA and  has been authorized for detection and/or diagnosis of SARS-CoV-2 by FDA under an Emergency Use Authorization (EUA).  This EUA will remain in effect (meaning this  test can be used) for the duration of  the COVID-19 declaration under Section 564(b)(1) of the Act, 21 U.S.C. section 360-bbb-3(b)(1), unless the authorization is terminated or revoked sooner.  Performed at [facility], [place] [address] [place], [place] [address]      Labs: BNP (last 3 results) No results for input(s): BNP in the last 8760 hours. Basic Metabolic Panel: Recent Labs  Lab 06/09/20 2313 06/09/20 2313 06/09/20 2327 06/09/20 2330 06/10/20 0550 06/11/20 0359 06/14/20 1108  NA 147*   < > 146* 145 148* 145 138  K 4.1   < > 3.8 3.8 3.3* 3.7 3.5  CL 101  --   --  100 103 108 103  CO2 33*  --   --   --  30 28 26   GLUCOSE 122*  --   --  120* 99 106* 98  BUN 64*  --   --  61* 57* 34* 18  CREATININE 3.00*  --   --  2.70* 2.36* 1.21 0.92  CALCIUM 8.8*  --   --   --  8.4* 8.3* 8.1*   < > = values in this interval not displayed.   Liver Function Tests: Recent Labs  Lab 06/09/20 2313 06/10/20 0550  AST 14* 15  ALT 10 10  ALKPHOS 52 48  BILITOT  0.9 0.5  PROT 6.4* 6.1*  ALBUMIN 2.6* 2.4*   No results for input(s): LIPASE, AMYLASE in the last 168 hours. Recent Labs  Lab 06/09/20 2315  AMMONIA 17   CBC: Recent Labs  Lab 06/09/20 2313 06/09/20 2327 06/09/20 2330 06/10/20 0550 06/11/20 0359  WBC 6.8  --   --  6.2 5.3  NEUTROABS  --   --   --   --  4.0  HGB 11.9* 12.2* 12.6* 11.8* 11.2*  HCT 38.9* 36.0* 37.0* 38.2* 34.9*  MCV 97.3  --   --  97.2 97.8  PLT 211  --   --  184 209   Cardiac Enzymes: No results for input(s): CKTOTAL, CKMB, CKMBINDEX, TROPONINI in the last 168 hours. BNP: Invalid input(s): POCBNP  CBG: Recent Labs  Lab 06/09/20 2302  GLUCAP 109*   D-Dimer No results for input(s): DDIMER in the last 72 hours. Hgb A1c No results for input(s): HGBA1C in the last 72 hours. Lipid Profile No results for input(s): CHOL, HDL, LDLCALC, TRIG, CHOLHDL, LDLDIRECT in the last 72 hours. Thyroid function studies No results for input(s): TSH, T4TOTAL, T3FREE, THYROIDAB in the last 72 hours.  Invalid input(s): FREET3 Anemia work up No results for input(s): VITAMINB12, FOLATE, FERRITIN, TIBC, IRON, RETICCTPCT in the last 72 hours. Urinalysis    Component Value Date/Time   COLORURINE AMBER (A) 06/10/2020 0120   APPEARANCEUR CLOUDY (A) 06/10/2020 0120   LABSPEC 1.016 06/10/2020 0120   PHURINE 5.0 06/10/2020 0120   GLUCOSEU NEGATIVE 06/10/2020 0120   HGBUR LARGE (A) 06/10/2020 0120   BILIRUBINUR NEGATIVE 06/10/2020 0120   KETONESUR NEGATIVE 06/10/2020 0120   PROTEINUR 100 (A) 06/10/2020 0120   UROBILINOGEN 1.0 10/17/2013 1533   NITRITE NEGATIVE 06/10/2020 0120   LEUKOCYTESUR SMALL (A) 06/10/2020 0120   Sepsis Labs Invalid input(s): PROCALCITONIN,  WBC,  LACTICIDVEN Microbiology Recent Results (from the past 240 hour(s))  Blood culture (routine single)     Status: None (Preliminary result)   Collection Time: 06/10/20 12:20 AM   Specimen: BLOOD RIGHT FOREARM  Result Value Ref Range Status   Specimen  Description BLOOD RIGHT FOREARM  Final   Special Requests   Final    BOTTLES DRAWN AEROBIC AND ANAEROBIC Blood Culture adequate volume   Culture   Final    NO GROWTH 4 DAYS Performed at [facility], [place] [address] [place], [place] [address]    Report Status PENDING  Incomplete  Urine culture     Status: None   Collection Time: 06/10/20  1:22 AM   Specimen: In/Out Cath Urine  Result Value Ref Range Status   Specimen Description IN/OUT CATH URINE  Final   Special Requests NONE  Final   Culture   Final    NO GROWTH Performed at [facility], [place] [address] [place], [place] [address]    Report Status 06/10/2020 FINAL  Final  Culture, blood (single)     Status: None (Preliminary result)   Collection Time: 06/10/20  3:28 AM   Specimen: BLOOD  Result Value Ref Range Status   Specimen Description BLOOD RIGHT ANTECUBITAL  Final   Special Requests   Final    BOTTLES DRAWN AEROBIC AND ANAEROBIC Blood Culture results may not be optimal due to an inadequate volume of blood received in culture bottles   Culture   Final    NO GROWTH 4 DAYS Performed at [facility], [place] [address], [place], [place] [address]    Report Status PENDING  Incomplete  SARS Coronavirus 2 by RT PCR (hospital order, performed in [facility] hospital lab) Nasopharyngeal Nasopharyngeal Swab     Status: Abnormal   Collection Time: 06/12/20  8:05 PM   Specimen: Nasopharyngeal Swab  Result Value Ref Range Status   SARS Coronavirus 2 POSITIVE (A) NEGATIVE Final    Comment: RESULT CALLED TO, READ BACK BY AND VERIFIED WITH: S. FUKUO,RN 2119 06/12/2020 T. TYSOR (NOTE) SARS-CoV-2 target nucleic acids are DETECTED  SARS-CoV-2 RNA is generally detectable in upper respiratory specimens  during the acute phase of infection.  Positive results are indicative  of the presence of the identified virus, but do not rule out bacterial infection or co-infection with other pathogens not detected by the test.   Clinical  correlation with patient history and  other diagnostic information is necessary to determine patient infection status.  The expected result is negative.  Fact Sheet for Patients:   StrictlyIdeas.no   Fact Sheet for Healthcare Providers:   BankingDealers.co.za    This test is not yet approved or cleared by the [place] FDA and  has been authorized for detection and/or diagnosis of SARS-CoV-2 by FDA under an Emergency Use Authorization (EUA).  This EUA will remain in effect (meaning this  test can be used) for the duration of  the COVID-19 declaration under Section 564(b)(1) of the Act, 21 U.S.C. section 360-bbb-3(b)(1), unless the authorization is terminated or revoked sooner.  Performed at [facility], [place] [address], [place], [place] [address]      Time coordinating discharge: 40 minutes  SIGNED:   Elmarie Shiley, MD  [facility]

## 2020-06-14 NOTE — TOC Progression Note (Signed)
Transition of Care [facility]) - Progression Note    Patient Details  Name: [name] MRN: [id number] Date of Birth: 07-Mar-1918  Transition of Care [facility]) CM/SW Contact  Sharlet Salina Mila Homer, LCSW Phone Number: 06/14/2020, 4:44 PM  Clinical Narrative:  Talked with [facility]) admissions director, Claiborne Billings regarding patient discharging to them today and she explained that they don't have the nursing coverage this evening to accept patient, however he can discharge on Wednesday, 9/1. Son contacted and advised. Claiborne Billings aware that patient is COVID positive and she does not need a COVID test.   Expected Discharge Plan: [place] Barriers to Discharge: No Barriers Identified  Expected Discharge Plan and Services Expected Discharge Plan: [place] Choice: [place] Living arrangements for the past 2 months: [place] Expected Discharge Date: 06/14/20                                   Social Determinants of Health (SDOH) Interventions  No SDOH interventions requested or needed at this time.   Readmission Risk Interventions No flowsheet data found.

## 2020-06-15 DIAGNOSIS — N179 Acute kidney failure, unspecified: Principal | ICD-10-CM

## 2020-06-15 LAB — CULTURE, BLOOD (SINGLE)
Culture: NO GROWTH
Culture: NO GROWTH
Special Requests: ADEQUATE

## 2020-06-15 NOTE — Care Management Important Message (Signed)
Important Message  Patient Details  Name: [name] MRN: [id number] Date of Birth: [birth year]   Medicare Important Message Given:  Yes - Important Message mailed due to current National Emergency  Verbal consent obtained due to current National Emergency  Relationship to patient: Child Contact Name: Matheson Vandehei Call Date: 06/15/20  Time: 1152 Phone: [phone] Outcome: No Answer/Busy Important Message mailed to: Patient address on file    Delorse Lek 06/15/2020, 11:53 AM

## 2020-06-15 NOTE — TOC Transition Note (Signed)
Transition of Care [facility]) - CM/SW Discharge Note *Discharged to [facility] SNF   Patient Details  Name: [name] MRN: [id number] Date of Birth: [birth year]  Transition of Care [facility]) CM/SW Contact:  Sable Feil, LCSW Phone Number: 06/15/2020, 1:11 PM   Clinical Narrative:  Patient medically stable for discharge and going to [facility] SNF. He from [facility]. Discharge clinicals transmitted to facility and son, Jie Stickels  contacted [phone]) and message left regarding patient's d/c today. Mr. Mazzoni will be transported by non-emergency ambulance transport.     Final next level of care: [place] ([place]) Barriers to Discharge: No Barriers Identified   Patient Goals and CMS Choice Patient states their goals for this hospitalization and ongoing recovery are:: Patient agreeable to rehab at discharge CMS Medicare.gov Compare Post Acute Care list provided to:: Patient Represenative (must comment) Choice offered to / list presented to : Adult Children  Discharge Placement   Existing PASRR number confirmed : 06/11/20          Patient chooses bed at: [facility] Patient to be transferred to facility by: [facility] Name of family member notified: Enid Skeens - called and message left Patient and family notified of of transfer: 06/15/20  Discharge Plan and Services     Post Acute Care Choice: [facility] SNF for ST rehab                             Social Determinants of Health (SDOH) Interventions  No SDOH interventions requested or needed at discharge   Readmission Risk Interventions No flowsheet data found.

## 2020-06-15 NOTE — Progress Notes (Signed)
DISCHARGE NOTE SNF [facility] to be discharged Skilled nursing facility per MD order. Patient verbalized understanding.  Skin clean, dry and intact without evidence of skin break down, no evidence of skin tears noted. IV catheter discontinued intact. Site without signs and symptoms of complications. Dressing and pressure applied. Pt denies pain at the site currently. No complaints noted.  Patient free of lines, drains, and wounds.   Discharge packet assembled. An After Visit Summary (AVS) was printed and given to the EMS personnel. Patient escorted via stretcher and discharged to [facility] via ambulance. Report called to accepting facility; all questions and concerns addressed.   Lamiyah Schlotter S Alyssamae Klinck, RN _______________________________________________________________________

## 2020-06-15 NOTE — Plan of Care (Signed)
  Problem: Safety: Goal: Ability to remain free from injury will improve Outcome: Progressing   

## 2020-06-15 NOTE — Progress Notes (Signed)
Report called and given to [facility].   Prisha Hiley, RN

## 2020-06-17 DIAGNOSIS — I482 Chronic atrial fibrillation, unspecified: Secondary | ICD-10-CM | POA: Diagnosis not present

## 2020-06-17 DIAGNOSIS — I10 Essential (primary) hypertension: Secondary | ICD-10-CM | POA: Diagnosis not present

## 2020-06-17 DIAGNOSIS — U071 COVID-19: Secondary | ICD-10-CM | POA: Diagnosis not present

## 2020-06-17 DIAGNOSIS — F1010 Alcohol abuse, uncomplicated: Secondary | ICD-10-CM | POA: Diagnosis not present

## 2020-06-22 DIAGNOSIS — F1010 Alcohol abuse, uncomplicated: Secondary | ICD-10-CM | POA: Diagnosis not present

## 2020-06-22 DIAGNOSIS — U071 COVID-19: Secondary | ICD-10-CM | POA: Diagnosis not present

## 2020-06-22 DIAGNOSIS — I10 Essential (primary) hypertension: Secondary | ICD-10-CM | POA: Diagnosis not present

## 2020-06-22 DIAGNOSIS — I482 Chronic atrial fibrillation, unspecified: Secondary | ICD-10-CM | POA: Diagnosis not present

## 2020-06-23 DIAGNOSIS — J984 Other disorders of lung: Secondary | ICD-10-CM | POA: Diagnosis not present

## 2020-06-23 DIAGNOSIS — J90 Pleural effusion, not elsewhere classified: Secondary | ICD-10-CM | POA: Diagnosis not present

## 2020-06-24 DIAGNOSIS — R296 Repeated falls: Secondary | ICD-10-CM | POA: Diagnosis not present

## 2020-06-24 DIAGNOSIS — U071 COVID-19: Secondary | ICD-10-CM | POA: Diagnosis not present

## 2020-06-24 DIAGNOSIS — I10 Essential (primary) hypertension: Secondary | ICD-10-CM | POA: Diagnosis not present

## 2020-06-24 DIAGNOSIS — I4891 Unspecified atrial fibrillation: Secondary | ICD-10-CM | POA: Diagnosis not present

## 2020-06-27 DIAGNOSIS — R945 Abnormal results of liver function studies: Secondary | ICD-10-CM | POA: Diagnosis not present

## 2020-06-27 DIAGNOSIS — D649 Anemia, unspecified: Secondary | ICD-10-CM | POA: Diagnosis not present

## 2020-06-27 DIAGNOSIS — R410 Disorientation, unspecified: Secondary | ICD-10-CM | POA: Diagnosis not present

## 2020-06-27 DIAGNOSIS — N390 Urinary tract infection, site not specified: Secondary | ICD-10-CM | POA: Diagnosis not present

## 2020-06-27 DIAGNOSIS — U071 COVID-19: Secondary | ICD-10-CM | POA: Diagnosis not present

## 2020-06-27 DIAGNOSIS — I10 Essential (primary) hypertension: Secondary | ICD-10-CM | POA: Diagnosis not present

## 2020-06-27 DIAGNOSIS — F1010 Alcohol abuse, uncomplicated: Secondary | ICD-10-CM | POA: Diagnosis not present

## 2020-06-27 DIAGNOSIS — I482 Chronic atrial fibrillation, unspecified: Secondary | ICD-10-CM | POA: Diagnosis not present

## 2020-06-28 DIAGNOSIS — R7989 Other specified abnormal findings of blood chemistry: Secondary | ICD-10-CM | POA: Diagnosis not present

## 2020-06-28 DIAGNOSIS — Z79899 Other long term (current) drug therapy: Secondary | ICD-10-CM | POA: Diagnosis not present

## 2020-06-30 DIAGNOSIS — D649 Anemia, unspecified: Secondary | ICD-10-CM | POA: Diagnosis not present

## 2020-06-30 DIAGNOSIS — R7989 Other specified abnormal findings of blood chemistry: Secondary | ICD-10-CM | POA: Diagnosis not present

## 2020-06-30 DIAGNOSIS — Z79899 Other long term (current) drug therapy: Secondary | ICD-10-CM | POA: Diagnosis not present

## 2020-07-05 DIAGNOSIS — I739 Peripheral vascular disease, unspecified: Secondary | ICD-10-CM | POA: Diagnosis not present

## 2020-07-05 DIAGNOSIS — L603 Nail dystrophy: Secondary | ICD-10-CM | POA: Diagnosis not present

## 2020-07-05 DIAGNOSIS — M79672 Pain in left foot: Secondary | ICD-10-CM | POA: Diagnosis not present

## 2020-07-11 DIAGNOSIS — N401 Enlarged prostate with lower urinary tract symptoms: Secondary | ICD-10-CM | POA: Diagnosis not present

## 2020-07-11 DIAGNOSIS — R338 Other retention of urine: Secondary | ICD-10-CM | POA: Diagnosis not present

## 2020-07-19 DIAGNOSIS — R7989 Other specified abnormal findings of blood chemistry: Secondary | ICD-10-CM | POA: Diagnosis not present

## 2020-07-19 DIAGNOSIS — Z79899 Other long term (current) drug therapy: Secondary | ICD-10-CM | POA: Diagnosis not present

## 2020-07-24 ENCOUNTER — Emergency Department (HOSPITAL_COMMUNITY): Payer: Medicare Other

## 2020-07-24 ENCOUNTER — Other Ambulatory Visit: Payer: Self-pay

## 2020-07-24 ENCOUNTER — Encounter (HOSPITAL_COMMUNITY): Payer: Self-pay | Admitting: Emergency Medicine

## 2020-07-24 ENCOUNTER — Inpatient Hospital Stay (HOSPITAL_COMMUNITY)
Admission: EM | Admit: 2020-07-24 | Discharge: 2020-08-15 | DRG: 291 | Disposition: E | Payer: Medicare Other | Source: Skilled Nursing Facility | Attending: Internal Medicine | Admitting: Internal Medicine

## 2020-07-24 DIAGNOSIS — Z515 Encounter for palliative care: Secondary | ICD-10-CM

## 2020-07-24 DIAGNOSIS — Z87891 Personal history of nicotine dependence: Secondary | ICD-10-CM

## 2020-07-24 DIAGNOSIS — I509 Heart failure, unspecified: Secondary | ICD-10-CM

## 2020-07-24 DIAGNOSIS — Z6820 Body mass index (BMI) 20.0-20.9, adult: Secondary | ICD-10-CM

## 2020-07-24 DIAGNOSIS — F0390 Unspecified dementia without behavioral disturbance: Secondary | ICD-10-CM | POA: Diagnosis present

## 2020-07-24 DIAGNOSIS — E46 Unspecified protein-calorie malnutrition: Secondary | ICD-10-CM | POA: Diagnosis present

## 2020-07-24 DIAGNOSIS — Z79899 Other long term (current) drug therapy: Secondary | ICD-10-CM

## 2020-07-24 DIAGNOSIS — I110 Hypertensive heart disease with heart failure: Secondary | ICD-10-CM | POA: Diagnosis not present

## 2020-07-24 DIAGNOSIS — I5043 Acute on chronic combined systolic (congestive) and diastolic (congestive) heart failure: Secondary | ICD-10-CM | POA: Diagnosis present

## 2020-07-24 DIAGNOSIS — R58 Hemorrhage, not elsewhere classified: Secondary | ICD-10-CM | POA: Diagnosis not present

## 2020-07-24 DIAGNOSIS — R0902 Hypoxemia: Secondary | ICD-10-CM | POA: Diagnosis not present

## 2020-07-24 DIAGNOSIS — R339 Retention of urine, unspecified: Secondary | ICD-10-CM | POA: Diagnosis present

## 2020-07-24 DIAGNOSIS — J189 Pneumonia, unspecified organism: Secondary | ICD-10-CM

## 2020-07-24 DIAGNOSIS — Z20822 Contact with and (suspected) exposure to covid-19: Secondary | ICD-10-CM | POA: Diagnosis present

## 2020-07-24 DIAGNOSIS — Z7189 Other specified counseling: Secondary | ICD-10-CM

## 2020-07-24 DIAGNOSIS — Z888 Allergy status to other drugs, medicaments and biological substances status: Secondary | ICD-10-CM

## 2020-07-24 DIAGNOSIS — Z951 Presence of aortocoronary bypass graft: Secondary | ICD-10-CM

## 2020-07-24 DIAGNOSIS — R319 Hematuria, unspecified: Secondary | ICD-10-CM | POA: Diagnosis present

## 2020-07-24 DIAGNOSIS — J690 Pneumonitis due to inhalation of food and vomit: Secondary | ICD-10-CM | POA: Diagnosis present

## 2020-07-24 DIAGNOSIS — Z7401 Bed confinement status: Secondary | ICD-10-CM

## 2020-07-24 DIAGNOSIS — I2510 Atherosclerotic heart disease of native coronary artery without angina pectoris: Secondary | ICD-10-CM | POA: Diagnosis present

## 2020-07-24 DIAGNOSIS — D649 Anemia, unspecified: Secondary | ICD-10-CM | POA: Diagnosis present

## 2020-07-24 DIAGNOSIS — I959 Hypotension, unspecified: Secondary | ICD-10-CM | POA: Diagnosis present

## 2020-07-24 DIAGNOSIS — I10 Essential (primary) hypertension: Secondary | ICD-10-CM | POA: Diagnosis present

## 2020-07-24 DIAGNOSIS — Z66 Do not resuscitate: Secondary | ICD-10-CM | POA: Diagnosis not present

## 2020-07-24 DIAGNOSIS — I5023 Acute on chronic systolic (congestive) heart failure: Secondary | ICD-10-CM | POA: Diagnosis not present

## 2020-07-24 DIAGNOSIS — I4821 Permanent atrial fibrillation: Secondary | ICD-10-CM | POA: Diagnosis present

## 2020-07-24 DIAGNOSIS — U099 Post covid-19 condition, unspecified: Secondary | ICD-10-CM | POA: Diagnosis present

## 2020-07-24 DIAGNOSIS — F05 Delirium due to known physiological condition: Secondary | ICD-10-CM | POA: Diagnosis present

## 2020-07-24 DIAGNOSIS — R404 Transient alteration of awareness: Secondary | ICD-10-CM | POA: Diagnosis not present

## 2020-07-24 DIAGNOSIS — I252 Old myocardial infarction: Secondary | ICD-10-CM

## 2020-07-24 DIAGNOSIS — G9340 Encephalopathy, unspecified: Secondary | ICD-10-CM | POA: Diagnosis present

## 2020-07-24 DIAGNOSIS — J90 Pleural effusion, not elsewhere classified: Secondary | ICD-10-CM | POA: Diagnosis not present

## 2020-07-24 DIAGNOSIS — J9601 Acute respiratory failure with hypoxia: Secondary | ICD-10-CM | POA: Diagnosis present

## 2020-07-24 DIAGNOSIS — R64 Cachexia: Secondary | ICD-10-CM | POA: Diagnosis present

## 2020-07-24 DIAGNOSIS — E8779 Other fluid overload: Secondary | ICD-10-CM

## 2020-07-24 DIAGNOSIS — I482 Chronic atrial fibrillation, unspecified: Secondary | ICD-10-CM | POA: Diagnosis present

## 2020-07-24 LAB — COMPREHENSIVE METABOLIC PANEL
ALT: 9 U/L (ref 0–44)
AST: 17 U/L (ref 15–41)
Albumin: 2.8 g/dL — ABNORMAL LOW (ref 3.5–5.0)
Alkaline Phosphatase: 75 U/L (ref 38–126)
Anion gap: 12 (ref 5–15)
BUN: 12 mg/dL (ref 8–23)
CO2: 30 mmol/L (ref 22–32)
Calcium: 9.2 mg/dL (ref 8.9–10.3)
Chloride: 94 mmol/L — ABNORMAL LOW (ref 98–111)
Creatinine, Ser: 0.77 mg/dL (ref 0.61–1.24)
GFR, Estimated: >60 mL/min (ref >60)
Glucose, Bld: 135 mg/dL — ABNORMAL HIGH (ref 70–99)
Potassium: 3.9 mmol/L (ref 3.5–5.1)
Sodium: 136 mmol/L (ref 135–145)
Total Bilirubin: 1.1 mg/dL (ref 0.3–1.2)
Total Protein: 6.8 g/dL (ref 6.5–8.1)

## 2020-07-24 LAB — URINALYSIS, ROUTINE W REFLEX MICROSCOPIC

## 2020-07-24 LAB — CBC WITH DIFFERENTIAL/PLATELET
Abs Immature Granulocytes: 0.15 10*3/uL — ABNORMAL HIGH (ref 0.00–0.07)
Basophils Absolute: 0 10*3/uL (ref 0.0–0.1)
Basophils Relative: 0 %
Eosinophils Absolute: 0 10*3/uL (ref 0.0–0.5)
Eosinophils Relative: 0 %
HCT: 41.1 % (ref 39.0–52.0)
Hemoglobin: 12.5 g/dL — ABNORMAL LOW (ref 13.0–17.0)
Immature Granulocytes: 2 %
Lymphocytes Relative: 10 %
Lymphs Abs: 1.1 10*3/uL (ref 0.7–4.0)
MCH: 30.4 pg (ref 26.0–34.0)
MCHC: 30.4 g/dL (ref 30.0–36.0)
MCV: 100 fL (ref 80.0–100.0)
Monocytes Absolute: 0.6 10*3/uL (ref 0.1–1.0)
Monocytes Relative: 5 %
Neutro Abs: 8.4 10*3/uL — ABNORMAL HIGH (ref 1.7–7.7)
Neutrophils Relative %: 83 %
Platelets: 263 10*3/uL (ref 150–400)
RBC: 4.11 MIL/uL — ABNORMAL LOW (ref 4.22–5.81)
RDW: 15.9 % — ABNORMAL HIGH (ref 11.5–15.5)
WBC: 10.2 10*3/uL (ref 4.0–10.5)
nRBC: 0 % (ref 0.0–0.2)

## 2020-07-24 LAB — URINALYSIS, MICROSCOPIC (REFLEX)
RBC / HPF: >50 RBC/hpf (ref 0–5)
Squamous Epithelial / HPF: NONE SEEN (ref 0–5)
WBC, UA: >50 WBC/hpf (ref 0–5)

## 2020-07-24 MED ORDER — FUROSEMIDE 10 MG/ML IJ SOLN
40.0000 mg | Freq: Once | INTRAMUSCULAR | Status: AC
  Administered 2020-07-24: 40 mg via INTRAVENOUS
  Filled 2020-07-24: qty 4

## 2020-07-24 NOTE — ED Triage Notes (Addendum)
Pt to triage via [place] from [facility].  Reports pt's foley not draining today so they "deflated the balloon and a clot came out and then they were unable to inflate the balloon back so they took it out."  Pt wears 3 liters O2.

## 2020-07-24 NOTE — H&P (Signed)
History and Physical   [name] ZOX:[id number] DOB: 10/12/1918 DOA: 07/29/2020  PCP: Lajean Manes, MD   Patient coming from: [facility] via EMS  Chief Complaint: Blood from Foley catheter   HPI: [name] is a 86 y.o. male with medical history significant of hypertension, atrial fibrillation, CAD (status post CABG x4), CHF with reduced ejection fraction (last echo in the system in 2015 and unable to be viewed) who presented from his facility via EMS after staff was having difficulty with his Foley and after deflating the balloon a clot came out and they were unable to reinflate Foley.  Patient also with some confusion.  He can have some waxing and waning confusion at baseline however he is typically alert and oriented x3 and he was alert and oriented x1 only in ED.  Additionally he was noted to have volume overload in the ED. Patient is unable to significantly participate in his history due to confusion, above history obtained via chart review.  He was alert and oriented x3 for me.  ED Course: Vital signs stable in the ED.  Lab work-up remarkable only for chronic anemia with hemoglobin 12.5 and likely protein malnutrition with albumin 2.8.  UA shows grossly bloody sample.  Chest x-ray showed new bilateral pleural effusions.  Review of Systems: Patient unable to participate in HPI due to confusion   Past Medical History:  Diagnosis Date  . Atrial fibrillation ([place])    with rapid ventricular repsonse  . Bone lesion    lytic  . Debility   . HTN (hypertension)   . S/P CABG x 4 with atrial clip 10/21/2013  . Small bowel obstruction [facility])     Past Surgical History:  Procedure Laterality Date  . APPENDECTOMY    . CLIPPING OF ATRIAL APPENDAGE  10/21/2013   Procedure: CLIPPING OF ATRIAL APPENDAGE;  Surgeon: Grace Isaac, MD;  Location: [place];  Service: Open Heart Surgery;;  . CORONARY ARTERY BYPASS GRAFT N/A 10/21/2013   Procedure: CORONARY ARTERY BYPASS GRAFTING (CABG);   Surgeon: Grace Isaac, MD;  Location: [place];  Service: Open Heart Surgery;  Laterality: N/A;  . HERNIA REPAIR     x2  . INTRAOPERATIVE TRANSESOPHAGEAL ECHOCARDIOGRAM N/A 10/21/2013   Procedure: INTRAOPERATIVE TRANSESOPHAGEAL ECHOCARDIOGRAM;  Surgeon: Grace Isaac, MD;  Location: [place];  Service: Open Heart Surgery;  Laterality: N/A;  . LEFT HEART CATHETERIZATION WITH CORONARY ANGIOGRAM N/A 10/20/2013   Procedure: LEFT HEART CATHETERIZATION WITH CORONARY ANGIOGRAM;  Surgeon: Troy Sine, MD;  Location: [facility] CATH LAB;  Service: Cardiovascular;  Laterality: N/A;  . PARATHYROIDECTOMY      Social History  reports that he has quit smoking. He has never used smokeless tobacco. He reports current alcohol use of about 7.0 standard drinks of alcohol per week. He reports that he does not use drugs.  Allergies  Allergen Reactions  . Norvasc [Amlodipine Besylate] Other (See Comments)    Weakness   . Symmetrel [Amantadine] Cough  . Hydrochlorothiazide Palpitations    No family history on file. Unable to update family history due to patient confusion  Prior to Admission medications   Medication Sig Start Date End Date Taking? Authorizing Provider  acetaminophen (TYLENOL) 325 MG tablet Take 2 tablets (650 mg total) by mouth every 6 (six) hours as needed for mild pain or headache (fever >/= 101). 05/20/20   Arrien, Jimmy Picket, MD  DILT-XR 240 MG 24 hr capsule Take 240 mg by mouth every morning. 01/15/20  [provider]  furosemide (LASIX) 40 MG tablet Take 1 tablet (40 mg total) by mouth daily as needed for fluid or edema (weight gain 3 lbs in 2 days or 5 lbs in 5 days.). 05/20/20 05/20/21  Arrien, Jimmy Picket, MD  metoprolol tartrate (LOPRESSOR) 50 MG tablet Take 1 tablet (50 mg total) by mouth 2 (two) times daily. 05/20/20 06/19/20  Arrien, Jimmy Picket, MD  Multiple Vitamin (MULTIVITAMIN) tablet Take 1 tablet by mouth daily.     [provider]  tamsulosin (FLOMAX) 0.4 MG  CAPS capsule Take 1 capsule (0.4 mg total) by mouth daily. 06/15/20   Elmarie Shiley, MD    Physical Exam: Vitals:   07/16/2020 1859 08/13/2020 2215 07/17/2020 2246  BP: 125/67 (!) 102/53 121/78  Pulse: 96 86 91  Resp: 16 16 18   Temp: 98.1 F (36.7 C)    TempSrc: Oral    SpO2: 95% 100% 100%    .Physical Exam Constitutional:      General: He is not in acute distress.    Appearance: Normal appearance.     Comments: Frail, chronically ill-appearing elderly male  HENT:     Head: Normocephalic and atraumatic.     Mouth/Throat:     Mouth: Mucous membranes are moist.     Pharynx: Oropharynx is clear.  Eyes:     Extraocular Movements: Extraocular movements intact.     Pupils: Pupils are equal, round, and reactive to light.  Cardiovascular:     Rate and Rhythm: Normal rate and regular rhythm.     Pulses: Normal pulses.     Heart sounds: Normal heart sounds.  Pulmonary:     Effort: Pulmonary effort is normal. No respiratory distress.     Breath sounds: Rales (Bibasilar) present.     Comments: On 3 L nasal cannula Abdominal:     General: Bowel sounds are normal. There is no distension.     Palpations: Abdomen is soft.     Tenderness: There is no abdominal tenderness.  Musculoskeletal:        General: Signs of injury present. No deformity.     Right lower leg: Edema present.     Left lower leg: Edema present.  Skin:    General: Skin is warm and dry.  Neurological:     Comments: will not open eyes but is alert and oriented to person, place, and year    Labs on Admission: I have personally reviewed following labs and imaging studies  CBC: Recent Labs  Lab 07/26/2020 2056  WBC 10.2  NEUTROABS 8.4*  HGB 12.5*  HCT 41.1  MCV 100.0  PLT 852    Basic Metabolic Panel: Recent Labs  Lab 07/21/2020 2056  NA 136  K 3.9  CL 94*  CO2 30  GLUCOSE 135*  BUN 12  CREATININE 0.77  CALCIUM 9.2    GFR: CrCl cannot be calculated (Unknown ideal weight.).  Liver Function  Tests: Recent Labs  Lab 07/25/2020 2056  AST 17  ALT 9  ALKPHOS 75  BILITOT 1.1  PROT 6.8  ALBUMIN 2.8*    Urine analysis:    Component Value Date/Time   COLORURINE BROWN (A) 08/04/2020 2022   APPEARANCEUR TURBID (A) 07/18/2020 2022   LABSPEC  08/12/2020 2022    TEST NOT REPORTED DUE TO COLOR INTERFERENCE OF URINE PIGMENT   PHURINE  08/03/2020 2022    TEST NOT REPORTED DUE TO COLOR INTERFERENCE OF URINE PIGMENT   GLUCOSEU (A) 08/14/2020 2022  TEST NOT REPORTED DUE TO COLOR INTERFERENCE OF URINE PIGMENT   HGBUR (A) 07/15/2020 2022    TEST NOT REPORTED DUE TO COLOR INTERFERENCE OF URINE PIGMENT   BILIRUBINUR (A) 07/31/2020 2022    TEST NOT REPORTED DUE TO COLOR INTERFERENCE OF URINE PIGMENT   KETONESUR (A) 07/28/2020 2022    TEST NOT REPORTED DUE TO COLOR INTERFERENCE OF URINE PIGMENT   PROTEINUR (A) 08/12/2020 2022    TEST NOT REPORTED DUE TO COLOR INTERFERENCE OF URINE PIGMENT   UROBILINOGEN 1.0 10/17/2013 1533   NITRITE (A) 07/19/2020 2022    TEST NOT REPORTED DUE TO COLOR INTERFERENCE OF URINE PIGMENT   LEUKOCYTESUR (A) 07/16/2020 2022    TEST NOT REPORTED DUE TO COLOR INTERFERENCE OF URINE PIGMENT    Radiological Exams on Admission: DG Chest 1 View  Result Date: 07/22/2020 CLINICAL DATA:  Fluid overload. COVID-19 viral pneumonia. Coronary artery disease. EXAM: CHEST  1 VIEW COMPARISON:  05/15/2020 FINDINGS: Heart size is stable. Prior CABG again noted. Aortic atherosclerotic calcification noted. Moderate bilateral pleural effusions and bibasilar atelectasis are new since previous study. Extensive calcified pleural plaque again seen bilaterally. IMPRESSION: New moderate bilateral pleural effusions and bibasilar atelectasis. Extensive bilateral calcified pleural plaques, consistent with asbestos related pleural disease. Electronically Signed   By: Marlaine Hind M.D.   On: 07/29/2020 20:54    EKG: Not yet obtained  Assessment/Plan Active Problems:   Essential  hypertension, benign   S/P CABG x 4 with atrial clip   Chronic atrial fibrillation (HCC)   CAD (coronary artery disease)   Acute exacerbation of CHF (congestive heart failure) (HCC)  Acute on chronic systolic heart failure - Last echo consistent in 2015 in unable to view results but chart review says LVEF 30 to 35% - Noted to be volume overloaded with new bilateral effusions on chest x-ray. - Received initial dose of IV Lasix in ED - Continue 40 mg IV Lasix twice daily, adjust based on response - Continue home diltiazem, metoprolol - Daily weights, strict I&O - Echocardiogram - Repeat chest x-ray in a.m. to reevaluate effusions, may need thoracentesis if consistent with goals of care - Check magnesium and follow BMP - Follow-up BNP  Blood from urinary catheter - Originally presented due to expressin clot after deflation of catheter balloon with staff inability to reinflate: - Grossly bloody in the ED but was flushed until clear continue to see blood on UA - No fever, leukocytosis or suprapubic tenderness to indicate UTI at this time; will need to continue to monitor -Consider repeat UA or work-up if persistent bloody urine and further work-up consistent with goals of cares  ?Encephalopathy versus delirium -  Is alert and oriented to person, place, and year; unable to converse otherwise; less oriented earlier in ED - May be a degree of delirium due to exacerbation of chronic medical problems - Continue to monitor and implement delirium precautions - SLP evaluation, because of this time unclear quality of swallow  Hypertension Atrial fibrillation CAD status post CABG x4 -Continue home diltiazem, metoprolol  DVT prophylaxis: Lovenox  Code Status:   Partial, discussed with son (no chest compressions/ACLS but does want noninvasive ventilation and/or intubation if necessary)  Family Communication:  Son, [name], updated by phone at [phone]   Disposition Plan:   Patient is  from:  [facility]  Anticipated DC to:  [facility]  Anticipated DC date:  10/13  Anticipated DC barriers: Pending clinical improvement   Consults called:  None Admission status:  Inpatient, telemetry  Severity of Illness: The appropriate patient status for this patient is INPATIENT. Inpatient status is judged to be reasonable and necessary in order to provide the required intensity of service to ensure the patient's safety. The patient's presenting symptoms, physical exam findings, and initial radiographic and laboratory data in the context of their chronic comorbidities is felt to place them at high risk for further clinical deterioration. Furthermore, it is not anticipated that the patient will be medically stable for discharge from the hospital within 2 midnights of admission. The following factors support the patient status of inpatient.   " The patient's presenting symptoms include volume overload, confusion. " The worrisome physical exam findings include volume overload. " The initial radiographic and laboratory data are worrisome because of bilateral effusions. " The chronic co-morbidities include CAD, CHF, A. fib.   * I certify that at the point of admission it is my clinical judgment that the patient will require inpatient hospital care spanning beyond 2 midnights from the point of admission due to high intensity of service, high risk for further deterioration and high frequency of surveillance required.Marcelyn Bruins MD [facility]  How to contact the [facility] Attending or Consulting provider [place] or covering provider during after hours [place], for this patient?   1. Check the care team in [facility] and look for a) attending/consulting TR[facility] provider listed and b) the [facility] team listed 2. Log into [email or web address] and use [place]'s universal password to access. If you do not have the password, please contact the hospital operator. 3. Locate the [facility] provider you are looking  for under [facility] and page to a number that you can be directly reached. 4. If you still have difficulty reaching the provider, please page the [facility] (Director on Call) for the Hospitalists listed on amion for assistance.  07/25/2020, 12:17 AM

## 2020-07-24 NOTE — ED Provider Notes (Signed)
[facility] EMERGENCY DEPARTMENT Provider Note   CSN: [id number] Arrival date & time: 07/20/2020  1633     History Chief Complaint  Patient presents with  . foley issues    [name] is a 86 y.o. male.  [name] is a 86 year old man who presents to the ED with the need for Foley replacement.  Per report, he has a chronic indwelling Foley which was not draining.  The Foley balloon was deflated and a large clot came out they are unable to reinflate the Foley balloon so the Foley was taken out.  He was then brought to the emergency room for the Foley balloon to be replaced.  After replacing the Foley catheter, [name] was resting comfortably in bed but unaware of the situation around him.  He was not able to answer any orientation questions.  He was able to say that he was having some mild abdominal pain.  Spoke with the son of [name].  The son reports that at baseline, [name] is generally conversive and oriented to person and place.  He does get confused at times and frequently where he has.  He does have some age-related dementia but seems to be getting worse lately but overall, his son reports that he was at baseline before coming to the emergency department.  Also notes that he did have a urinary tract infection several months ago that required hospitalization the symptoms were to make sure that there is no evidence of urinary tract infection at this time.          Past Medical History:  Diagnosis Date  . Atrial fibrillation ([place])    with rapid ventricular repsonse  . Bone lesion    lytic  . Debility   . HTN (hypertension)   . S/P CABG x 4 with atrial clip 10/21/2013  . Small bowel obstruction [facility])     Patient Active Problem List   Diagnosis Date Noted  . Hypernatremia 06/10/2020  . Pressure injury of skin 05/17/2020  . Sepsis due to Escherichia coli (E. coli) ([place]) 05/16/2020  . Subdural hematoma ([place]) 05/16/2020  . Atrial fibrillation with  RVR ([place]) 05/16/2020  . COVID-19 virus infection 05/15/2020  . COVID-19 05/15/2020  . AKI (acute kidney injury) ([place])   . Chest pain 01/10/2019  . Abdominal pain 01/10/2019  . LFT elevation 01/10/2019  . Abnormal CT of the chest 01/10/2019  . Swelling in head/neck   . CAD (coronary artery disease) 11/30/2013  . Chronic atrial fibrillation ([place]) 10/27/2013  . Chronic anticoagulation 10/27/2013  . Cardiomyopathy, ischemic- EF 35-40% echo 10/19/12 10/27/2013  . S/P CABG x 4 with atrial clip 10/21/2013  . Left main coronary artery disease 10/20/2013  . Hx of asbestosis 10/20/2013  . NSTEMI (non-ST elevated myocardial infarction) ([place]) 10/17/2013  . Essential hypertension, benign 08/21/2010  . Atrial fibrillation with rapid ventricular response on adm 10/17/13 08/21/2010    Past Surgical History:  Procedure Laterality Date  . APPENDECTOMY    . CLIPPING OF ATRIAL APPENDAGE  10/21/2013   Procedure: CLIPPING OF ATRIAL APPENDAGE;  Surgeon: Grace Isaac, MD;  Location: [place];  Service: Open Heart Surgery;;  . CORONARY ARTERY BYPASS GRAFT N/A 10/21/2013   Procedure: CORONARY ARTERY BYPASS GRAFTING (CABG);  Surgeon: Grace Isaac, MD;  Location: [place];  Service: Open Heart Surgery;  Laterality: N/A;  . HERNIA REPAIR     x2  . INTRAOPERATIVE TRANSESOPHAGEAL ECHOCARDIOGRAM N/A 10/21/2013   Procedure: INTRAOPERATIVE  TRANSESOPHAGEAL ECHOCARDIOGRAM;  Surgeon: Grace Isaac, MD;  Location: [place];  Service: Open Heart Surgery;  Laterality: N/A;  . LEFT HEART CATHETERIZATION WITH CORONARY ANGIOGRAM N/A 10/20/2013   Procedure: LEFT HEART CATHETERIZATION WITH CORONARY ANGIOGRAM;  Surgeon: Troy Sine, MD;  Location: [facility] CATH LAB;  Service: Cardiovascular;  Laterality: N/A;  . PARATHYROIDECTOMY         No family history on file.  Social History   Tobacco Use  . Smoking status: Former Research scientist (life sciences)  . Smokeless tobacco: Never Used  . Tobacco comment: During WWII  Vaping Use  . Vaping Use: Never  used  Substance Use Topics  . Alcohol use: Yes    Alcohol/week: 7.0 standard drinks    Types: 7 Glasses of wine per week  . Drug use: No    Home Medications Prior to Admission medications   Medication Sig Start Date End Date Taking? Authorizing Provider  acetaminophen (TYLENOL) 325 MG tablet Take 2 tablets (650 mg total) by mouth every 6 (six) hours as needed for mild pain or headache (fever >/= 101). 05/20/20   Arrien, Jimmy Picket, MD  DILT-XR 240 MG 24 hr capsule Take 240 mg by mouth every morning. 01/15/20   [provider]  furosemide (LASIX) 40 MG tablet Take 1 tablet (40 mg total) by mouth daily as needed for fluid or edema (weight gain 3 lbs in 2 days or 5 lbs in 5 days.). 05/20/20 05/20/21  Arrien, Jimmy Picket, MD  metoprolol tartrate (LOPRESSOR) 50 MG tablet Take 1 tablet (50 mg total) by mouth 2 (two) times daily. 05/20/20 06/19/20  Arrien, Jimmy Picket, MD  Multiple Vitamin (MULTIVITAMIN) tablet Take 1 tablet by mouth daily.     [provider]  tamsulosin (FLOMAX) 0.4 MG CAPS capsule Take 1 capsule (0.4 mg total) by mouth daily. 06/15/20   Regalado, Jerald Kief A, MD    Allergies    Norvasc [amlodipine besylate], Symmetrel [amantadine], and Hydrochlorothiazide  Review of Systems   Review of Systems  Physical Exam Updated Vital Signs BP 125/67 (BP Location: Right Arm)   Pulse 96   Temp 98.1 F (36.7 C) (Oral)   Resp 16   SpO2 95%   Physical Exam  ED Results / Procedures / Treatments   Labs (all labs ordered are listed, but only abnormal results are displayed) Labs Reviewed - No data to display  EKG None  Radiology No results found.  Procedures Procedures (including critical care time)  Medications Ordered in ED Medications - No data to display  ED Course  I have reviewed the triage vital signs and the nursing notes.  Pertinent labs & imaging results that were available during my care of the patient were reviewed by me and considered in my  medical decision making (see chart for details).    MDM Rules/Calculators/A&P                           [name] is a 86 year old gentleman who presents to the ED to exchange his Foley but also appears grossly fluid overloaded on exam.  Chest x-ray demonstrates significant bilateral pulmonary effusions.  He is also demonstrating some hypoxia with a requirement of 3 L nasal cannula.  He has not unknown oxygen requirement at home.  He has been at an assisted living facility for roughly the past 3 weeks but seems to have grown progressively fluid overloaded at that time.  Foley was exchanged without incident.  He  did have some significant hematuria.  UA was unhelpful due to hematuria.  Urine cultures were sent.  The Foley was ultimately flushed until clear urine was seen.  Physical exam demonstrates significant anasarca with dependent edema in his chest and lower extremities.  Chest x-ray demonstrates significant bilateral pleural effusions.  He is tachycardic up to 110.  The differential for his fluid overload includes CHF and likely contributions from protein calorie malnutrition with an albumin of 2.8.  There is no clear evidence of infection at this time.  For now, we will plan to admit for diuresis.   Final Clinical Impression(s) / ED Diagnoses Final diagnoses:  None    Rx / DC Orders ED Discharge Orders    None       Matilde Haymaker, MD 08/13/2020 0768    Elnora Morrison, MD 07/25/20 0007

## 2020-07-24 NOTE — ED Notes (Signed)
[place] son [phone] would like an update

## 2020-07-25 ENCOUNTER — Inpatient Hospital Stay (HOSPITAL_COMMUNITY): Payer: Medicare Other

## 2020-07-25 DIAGNOSIS — Z951 Presence of aortocoronary bypass graft: Secondary | ICD-10-CM | POA: Diagnosis not present

## 2020-07-25 DIAGNOSIS — J9601 Acute respiratory failure with hypoxia: Secondary | ICD-10-CM | POA: Diagnosis present

## 2020-07-25 DIAGNOSIS — J90 Pleural effusion, not elsewhere classified: Secondary | ICD-10-CM | POA: Diagnosis not present

## 2020-07-25 DIAGNOSIS — I509 Heart failure, unspecified: Secondary | ICD-10-CM

## 2020-07-25 DIAGNOSIS — Z87891 Personal history of nicotine dependence: Secondary | ICD-10-CM | POA: Diagnosis not present

## 2020-07-25 DIAGNOSIS — I5023 Acute on chronic systolic (congestive) heart failure: Secondary | ICD-10-CM | POA: Diagnosis not present

## 2020-07-25 DIAGNOSIS — J690 Pneumonitis due to inhalation of food and vomit: Secondary | ICD-10-CM | POA: Diagnosis present

## 2020-07-25 DIAGNOSIS — R319 Hematuria, unspecified: Secondary | ICD-10-CM | POA: Diagnosis present

## 2020-07-25 DIAGNOSIS — E8779 Other fluid overload: Secondary | ICD-10-CM | POA: Diagnosis not present

## 2020-07-25 DIAGNOSIS — Z79899 Other long term (current) drug therapy: Secondary | ICD-10-CM | POA: Diagnosis not present

## 2020-07-25 DIAGNOSIS — D649 Anemia, unspecified: Secondary | ICD-10-CM | POA: Diagnosis present

## 2020-07-25 DIAGNOSIS — Z20822 Contact with and (suspected) exposure to covid-19: Secondary | ICD-10-CM | POA: Diagnosis present

## 2020-07-25 DIAGNOSIS — I4821 Permanent atrial fibrillation: Secondary | ICD-10-CM | POA: Diagnosis present

## 2020-07-25 DIAGNOSIS — I252 Old myocardial infarction: Secondary | ICD-10-CM | POA: Diagnosis not present

## 2020-07-25 DIAGNOSIS — R64 Cachexia: Secondary | ICD-10-CM | POA: Diagnosis present

## 2020-07-25 DIAGNOSIS — I482 Chronic atrial fibrillation, unspecified: Secondary | ICD-10-CM | POA: Diagnosis not present

## 2020-07-25 DIAGNOSIS — Z6820 Body mass index (BMI) 20.0-20.9, adult: Secondary | ICD-10-CM | POA: Diagnosis not present

## 2020-07-25 DIAGNOSIS — R339 Retention of urine, unspecified: Secondary | ICD-10-CM | POA: Diagnosis present

## 2020-07-25 DIAGNOSIS — Z66 Do not resuscitate: Secondary | ICD-10-CM | POA: Diagnosis not present

## 2020-07-25 DIAGNOSIS — Z888 Allergy status to other drugs, medicaments and biological substances status: Secondary | ICD-10-CM | POA: Diagnosis not present

## 2020-07-25 DIAGNOSIS — I10 Essential (primary) hypertension: Secondary | ICD-10-CM | POA: Diagnosis not present

## 2020-07-25 DIAGNOSIS — F0390 Unspecified dementia without behavioral disturbance: Secondary | ICD-10-CM | POA: Diagnosis present

## 2020-07-25 DIAGNOSIS — G9340 Encephalopathy, unspecified: Secondary | ICD-10-CM | POA: Diagnosis present

## 2020-07-25 DIAGNOSIS — E46 Unspecified protein-calorie malnutrition: Secondary | ICD-10-CM | POA: Diagnosis present

## 2020-07-25 DIAGNOSIS — U099 Post covid-19 condition, unspecified: Secondary | ICD-10-CM | POA: Diagnosis present

## 2020-07-25 DIAGNOSIS — F05 Delirium due to known physiological condition: Secondary | ICD-10-CM | POA: Diagnosis present

## 2020-07-25 DIAGNOSIS — J811 Chronic pulmonary edema: Secondary | ICD-10-CM | POA: Diagnosis not present

## 2020-07-25 DIAGNOSIS — J189 Pneumonia, unspecified organism: Secondary | ICD-10-CM | POA: Diagnosis not present

## 2020-07-25 DIAGNOSIS — I5043 Acute on chronic combined systolic (congestive) and diastolic (congestive) heart failure: Secondary | ICD-10-CM | POA: Diagnosis not present

## 2020-07-25 DIAGNOSIS — Z515 Encounter for palliative care: Secondary | ICD-10-CM | POA: Diagnosis not present

## 2020-07-25 DIAGNOSIS — I110 Hypertensive heart disease with heart failure: Secondary | ICD-10-CM | POA: Diagnosis present

## 2020-07-25 DIAGNOSIS — J9811 Atelectasis: Secondary | ICD-10-CM | POA: Diagnosis not present

## 2020-07-25 DIAGNOSIS — I2510 Atherosclerotic heart disease of native coronary artery without angina pectoris: Secondary | ICD-10-CM | POA: Diagnosis not present

## 2020-07-25 LAB — ECHOCARDIOGRAM COMPLETE
AV Mean grad: 3 mmHg
AV Peak grad: 6 mmHg
Ao pk vel: 1.22 m/s
Area-P 1/2: 4.89 cm2
S' Lateral: 2.95 cm

## 2020-07-25 LAB — CBC
HCT: 35.6 % — ABNORMAL LOW (ref 39.0–52.0)
Hemoglobin: 11 g/dL — ABNORMAL LOW (ref 13.0–17.0)
MCH: 31.2 pg (ref 26.0–34.0)
MCHC: 30.9 g/dL (ref 30.0–36.0)
MCV: 100.8 fL — ABNORMAL HIGH (ref 80.0–100.0)
Platelets: 240 10*3/uL (ref 150–400)
RBC: 3.53 MIL/uL — ABNORMAL LOW (ref 4.22–5.81)
RDW: 15.8 % — ABNORMAL HIGH (ref 11.5–15.5)
WBC: 9.1 10*3/uL (ref 4.0–10.5)
nRBC: 0 % (ref 0.0–0.2)

## 2020-07-25 LAB — RESPIRATORY PANEL BY RT PCR (FLU A&B, COVID)
Influenza A by PCR: NEGATIVE
Influenza B by PCR: NEGATIVE
SARS Coronavirus 2 by RT PCR: NEGATIVE

## 2020-07-25 LAB — BRAIN NATRIURETIC PEPTIDE: B Natriuretic Peptide: 763.1 pg/mL — ABNORMAL HIGH (ref 0.0–100.0)

## 2020-07-25 LAB — BASIC METABOLIC PANEL
Anion gap: 8 (ref 5–15)
BUN: 12 mg/dL (ref 8–23)
CO2: 34 mmol/L — ABNORMAL HIGH (ref 22–32)
Calcium: 8.9 mg/dL (ref 8.9–10.3)
Chloride: 94 mmol/L — ABNORMAL LOW (ref 98–111)
Creatinine, Ser: 0.76 mg/dL (ref 0.61–1.24)
GFR, Estimated: >60 mL/min (ref >60)
Glucose, Bld: 136 mg/dL — ABNORMAL HIGH (ref 70–99)
Potassium: 4.6 mmol/L (ref 3.5–5.1)
Sodium: 136 mmol/L (ref 135–145)

## 2020-07-25 LAB — MAGNESIUM: Magnesium: 2 mg/dL (ref 1.7–2.4)

## 2020-07-25 MED ORDER — WHITE PETROLATUM EX OINT
TOPICAL_OINTMENT | CUTANEOUS | Status: DC | PRN
  Filled 2020-07-25: qty 28.35

## 2020-07-25 MED ORDER — CHLORHEXIDINE GLUCONATE 0.12 % MT SOLN
15.0000 mL | Freq: Two times a day (BID) | OROMUCOSAL | Status: DC
  Administered 2020-07-27 – 2020-07-29 (×5): 15 mL via OROMUCOSAL
  Filled 2020-07-25 (×4): qty 15

## 2020-07-25 MED ORDER — CHLORHEXIDINE GLUCONATE CLOTH 2 % EX PADS
6.0000 | MEDICATED_PAD | Freq: Every day | CUTANEOUS | Status: DC
  Administered 2020-07-25 – 2020-07-29 (×5): 6 via TOPICAL

## 2020-07-25 MED ORDER — DILTIAZEM HCL-DEXTROSE 125-5 MG/125ML-% IV SOLN (PREMIX)
5.0000 mg/h | INTRAVENOUS | Status: DC
  Administered 2020-07-25: 5 mg/h via INTRAVENOUS
  Administered 2020-07-26 – 2020-07-27 (×2): 7.5 mg/h via INTRAVENOUS
  Filled 2020-07-25 (×5): qty 125

## 2020-07-25 MED ORDER — METOPROLOL TARTRATE 25 MG PO TABS: 50.0000 mg | ORAL_TABLET | Freq: Two times a day (BID) | ORAL | Status: DC

## 2020-07-25 MED ORDER — ORAL CARE MOUTH RINSE
15.0000 mL | Freq: Two times a day (BID) | OROMUCOSAL | Status: DC
  Administered 2020-07-28 – 2020-07-29 (×3): 15 mL via OROMUCOSAL

## 2020-07-25 MED ORDER — FUROSEMIDE 10 MG/ML IJ SOLN
20.0000 mg | Freq: Every day | INTRAMUSCULAR | Status: DC
  Administered 2020-07-25 – 2020-07-27 (×3): 20 mg via INTRAVENOUS
  Filled 2020-07-25 (×3): qty 2

## 2020-07-25 MED ORDER — FUROSEMIDE 10 MG/ML IJ SOLN: 20.0000 mg | Freq: Every day | INTRAMUSCULAR | Status: DC

## 2020-07-25 MED ORDER — SODIUM CHLORIDE 0.9% FLUSH
3.0000 mL | Freq: Two times a day (BID) | INTRAVENOUS | Status: DC
  Administered 2020-07-25 – 2020-07-29 (×8): 3 mL via INTRAVENOUS

## 2020-07-25 MED ORDER — DILTIAZEM HCL ER COATED BEADS 240 MG PO CP24
240.0000 mg | ORAL_CAPSULE | Freq: Every morning | ORAL | Status: DC
  Filled 2020-07-25: qty 1

## 2020-07-25 MED ORDER — ACETAMINOPHEN 650 MG RE SUPP: 650.0000 mg | Freq: Four times a day (QID) | RECTAL | Status: DC | PRN

## 2020-07-25 MED ORDER — ADULT MULTIVITAMIN W/MINERALS CH
1.0000 | ORAL_TABLET | Freq: Every day | ORAL | Status: DC
  Filled 2020-07-25 (×2): qty 1

## 2020-07-25 MED ORDER — ACETAMINOPHEN 325 MG PO TABS: 650.0000 mg | ORAL_TABLET | Freq: Four times a day (QID) | ORAL | Status: DC | PRN

## 2020-07-25 MED ORDER — TAMSULOSIN HCL 0.4 MG PO CAPS
0.4000 mg | ORAL_CAPSULE | Freq: Every day | ORAL | Status: DC
  Filled 2020-07-25 (×2): qty 1

## 2020-07-25 MED ORDER — FUROSEMIDE 10 MG/ML IJ SOLN
40.0000 mg | Freq: Two times a day (BID) | INTRAMUSCULAR | Status: DC
  Filled 2020-07-25: qty 4

## 2020-07-25 MED ORDER — ENOXAPARIN SODIUM 30 MG/0.3ML ~~LOC~~ SOLN
30.0000 mg | Freq: Every day | SUBCUTANEOUS | Status: DC
  Administered 2020-07-25 – 2020-07-27 (×3): 30 mg via SUBCUTANEOUS
  Filled 2020-07-25 (×3): qty 0.3

## 2020-07-25 NOTE — Progress Notes (Signed)
°  Echocardiogram 2D Echocardiogram has been performed.  Makael Stein G Kasee Hantz 07/25/2020, 9:26 AM

## 2020-07-25 NOTE — Progress Notes (Signed)
PROGRESS NOTE    [name]  ZWC:[id number] DOB: 1918-08-22 DOA: 08/12/2020 PCP: Brent Manes, MD  Brief Narrative:   86 year old [facility] resident--at baseline he cannot stand /sit-his mental state had been ok but in the last 3 days he was hallucinating and memory was bad---poor memory at baseline but not overtly Stemi s/p CABG x 4 done 10/21/2013 Permanent A. fib followed in the past by [name] status post unsuccessful DCCV Prior cholecystitis 2020 Positive COVID without symptoms-19 05/2020--at that admission found to have acute subdural hemorrhage in addition and sacral decubitus stage I in addition to AKI Some underlying dementia Admit from [name] 08/14/2020 with blood from Foley catheter inability to reinflate as well as mild confusion Also found to have volume overload in ED CBC hemoglobin 12.5  Assessment & Plan:   Active Problems:   Essential hypertension, benign   S/P CABG x 4 with atrial clip   Chronic atrial fibrillation (HCC)   CAD (coronary artery disease)   Acute exacerbation of CHF (congestive heart failure) ([place])   1. Acute decompensated systolic heart failure a. Home medication is Lasix 40 daily as needed and or for weight gain which has been increased to Lasix IV twice daily 40 mg b. Unfortunately this is had to be held because patient is hypotensive and I have adjusted the Lasix to 20 mg to be given tomorrow c. Last discharge weight is unclear d. Monitor monitor creatinine trends with diuresis e. Likely repeat chest x-ray a.m. 10/12  f. echocardiogram performed but is pending 2. Blood from urinary catheter-Foley placed around last admission 05/2020 secondary to urinary retention a. Last admission it was felt feasible to withhold Foley catheter patient was supposed to follow-up with urologist [name] pace b. Continue to monitor trends and if further bleeding will need to speak to urologist c. Continue Flomax 0.4 daily 3. Underlying  encephalopathy a. Etiology unclear-last time was secondary to azotemia b. Could be some subacute delirium in addition c. When more stable and alert can resume sertraline 25 which has been held for now d. May need Ativan 4. CABG 03/18/2014 5. Permanent A. fib 6. HTN a. Home meds are Cardizem 240 every morning and metoprolol 50 twice daily b. Cannot give oral meds as he is very somnolent and sleepy so start Cardizem drip low-dose 5 mg keep map above 65 if possible 7. Prior Covid infection 05/2020 a. History of was asymptomatic 8. Advanced age with delirium dementia a. Confused and not very arousable b. Will probably need goals of care at this admission  DVT prophylaxis: Lovenox Code Status: Partial code Family Communication: Called son [name] at [phone] And updated Disposition:   Status is: Inpatient  Remains inpatient appropriate because:Ongoing active pain requiring inpatient pain management, Ongoing diagnostic testing needed not appropriate for outpatient work up, Unsafe d/c plan and IV treatments appropriate due to intensity of illness or inability to take PO   Dispo: The patient is from: SNF              Anticipated d/c is to: SNF              Anticipated d/c date is: 3 days              Patient currently is not medically stable to d/c.   Consultants:   None currently  Procedures: none  Antimicrobials: none currently    Subjective: Cannot orient quite sleepy and seems to be somewhat confused I am unable to get any review  of systems Nursing let me know that he is too sleepy to give meds and that blood pressures were low earlier today   Objective: Vitals:   07/25/20 0615 07/25/20 0630 07/25/20 0645 07/25/20 0700  BP: 123/62 97/76 (!) 85/50 (!) 84/52  Pulse:  (!) 107 (!) 120 85  Resp: (!) 25 13 13 14   Temp:      TempSrc:      SpO2:  100% 100% 100%   No intake or output data in the 24 hours ending 07/25/20 0723 There were no vitals filed for this  visit.  Examination:  General exam: Awake coherent no distress but quite sleepy crusting around the mouth neck soft supple Respiratory system: Some mild rales posterolaterally Cardiovascular system: S1-S2 no murmur tachycardic 130s in A. fib Gastrointestinal system: Soft nontender no rebound no guarding. Central nervous system: Intact no focal deficit Extremities: ROM intact to passive motion but does not awake oriented enough to allow active motion Skin: Grade 2 lower extremity edema Psychiatry: Cannot assess  Data Reviewed: I have personally reviewed following labs and imaging studies BNP 763 CO2 34 BUN/creatinine 12/0.7 Magnesium 2.0 Hemoglobin 11, platelet 240, WBC 9.5  Radiology Studies: DG Chest 1 View  Result Date: 08/01/2020 CLINICAL DATA:  Fluid overload. COVID-19 viral pneumonia. Coronary artery disease. EXAM: CHEST  1 VIEW COMPARISON:  05/15/2020 FINDINGS: Heart size is stable. Prior CABG again noted. Aortic atherosclerotic calcification noted. Moderate bilateral pleural effusions and bibasilar atelectasis are new since previous study. Extensive calcified pleural plaque again seen bilaterally. IMPRESSION: New moderate bilateral pleural effusions and bibasilar atelectasis. Extensive bilateral calcified pleural plaques, consistent with asbestos related pleural disease. Electronically Signed   By: Marlaine Hind M.D.   On: 07/22/2020 20:54   DG Chest Port 1 View  Result Date: 07/25/2020 CLINICAL DATA:  Hypoxia, pleural effusion EXAM: PORTABLE CHEST 1 VIEW COMPARISON:  08/10/2020 at 8:35 p.m. FINDINGS: Lung volumes are small, however, pulmonary insufflation remain stable since prior examination. Extensive bilateral calcified pleural plaques are again identified with associated pleural thickening compatible with this best is related pleural disease. Small left and moderate to large right pleural effusions are present, unchanged. Coronary artery bypass grafting and left atrial clipping  has been performed. Cardiac size is mildly enlarged, unchanged. Pulmonary vascularity is normal. No acute bone abnormality. IMPRESSION: Stable examination. Bilateral pleural effusions, moderate to large on the right and small on the left. Extensive asbestos related pleural disease again noted. Electronically Signed   By: Fidela Salisbury MD   On: 07/25/2020 00:19     Scheduled Meds: . diltiazem  240 mg Oral q morning - 10a  . enoxaparin (LOVENOX) injection  30 mg Subcutaneous Daily  . furosemide  40 mg Intravenous BID  . metoprolol tartrate  50 mg Oral BID  . multivitamin with minerals  1 tablet Oral Daily  . sodium chloride flush  3 mL Intravenous Q12H  . tamsulosin  0.4 mg Oral Daily   Continuous Infusions:   LOS: 0 days    Time spent: [place], MD [facility] To contact the attending provider between 7A-7P or the covering provider during after hours 7P-7A, please log into the web site [email or web address] and access using universal [place] password for that web site. If you do not have the password, please call the hospital operator.  07/25/2020, 7:23 AM

## 2020-07-25 NOTE — Progress Notes (Addendum)
Patient arrived on unit from ED at 1635 on 2L O2 [place] at 85%. CCM notified. CHG completed. VS taken. Noted evidence of IM injection left arm.  Daymon Larsen, RN

## 2020-07-25 NOTE — ED Notes (Signed)
Attempted report 

## 2020-07-25 NOTE — ED Notes (Signed)
Delayed start for Diltiazem due to parameters of vitals. Upon assessment the patients HR fluctuates from 119-142. Will initiate Diltiazem with close monitoring.

## 2020-07-25 NOTE — Progress Notes (Signed)
°   07/25/20 1940 07/25/20 1946 07/25/20 2000  Assess: MEWS Score  Temp 97.9 F (36.6 C)  --   --   BP 118/67  --  95/70  Pulse Rate (!) 134  --  (!) 130  ECG Heart Rate (!) 134  --  (!) 129  Resp 18  --  18  Level of Consciousness Alert Alert  --   SpO2 97 %  --  94 %  O2 Device Nasal Cannula  --  Nasal Cannula  O2 Flow Rate (L/min) 4 L/min  --  4 L/min    07/25/20 2100  Assess: MEWS Score  Temp  --   BP 104/60  Pulse Rate (!) 128  ECG Heart Rate (!) 121  Resp 17  Level of Consciousness  --   SpO2 97 %  O2 Device Nasal Cannula  O2 Flow Rate (L/min) 4 L/min   Red/Yellow MEWS noted from day shift. Continuing q1 hr vitals. Pt currently on cardizem gtt at 7.65ml/hr. HR stays between 110s-130s. Will continue to monitor.

## 2020-07-25 NOTE — ED Notes (Signed)
ED TO INPATIENT HANDOFF REPORT  ED Nurse Name and Phone #: Kriste Basque [phone]  S Name/Age/Gender [name] 86 y.o. male Room/Bed: 007C/007C  Code Status   Code Status: Partial Code  Home/SNF/Other Nursing Home Patient oriented to: self Is this baseline? No   Triage Complete: Triage complete  Chief Complaint Acute exacerbation of CHF (congestive heart failure) ([place]) [I50.9] Heart failure ([place]) [I50.9]  Triage Note Pt to triage via [place] from [facility].  Reports pt's foley not draining today so they "deflated the balloon and a clot came out and then they were unable to inflate the balloon back so they took it out."  Pt wears 3 liters O2.    Allergies Allergies  Allergen Reactions  . Norvasc [Amlodipine Besylate] Other (See Comments)    Weakness   . Symmetrel [Amantadine] Cough  . Hydrochlorothiazide Palpitations    Level of Care/Admitting Diagnosis ED Disposition    ED Disposition Condition [facility] Area: [place] [[id number]]  Level of Care: Progressive [102]  Admit to Progressive based on following criteria: CARDIOVASCULAR & THORACIC of moderate stability with acute coronary syndrome symptoms/low risk myocardial infarction/hypertensive urgency/arrhythmias/heart failure potentially compromising stability and stable post cardiovascular intervention patients.  May admit patient to [facility] or [facility] if equivalent level of care is available:: No  Covid Evaluation: Symptomatic Person Under Investigation (PUI)  Diagnosis: Heart failure [facility]) [[id number]]  Admitting Physician: Nita Sells [phone]  Attending Physician: Nita Sells [phone]  Estimated length of stay: 3 - 4 days  Certification:: I certify this patient will need inpatient services for at least 2 midnights       B Medical/Surgery History Past Medical History:  Diagnosis Date  . Atrial fibrillation ([place])    with rapid ventricular repsonse  . Bone  lesion    lytic  . Debility   . HTN (hypertension)   . S/P CABG x 4 with atrial clip 10/21/2013  . Small bowel obstruction [facility])    Past Surgical History:  Procedure Laterality Date  . APPENDECTOMY    . CLIPPING OF ATRIAL APPENDAGE  10/21/2013   Procedure: CLIPPING OF ATRIAL APPENDAGE;  Surgeon: Grace Isaac, MD;  Location: [place];  Service: Open Heart Surgery;;  . CORONARY ARTERY BYPASS GRAFT N/A 10/21/2013   Procedure: CORONARY ARTERY BYPASS GRAFTING (CABG);  Surgeon: Grace Isaac, MD;  Location: [place];  Service: Open Heart Surgery;  Laterality: N/A;  . HERNIA REPAIR     x2  . INTRAOPERATIVE TRANSESOPHAGEAL ECHOCARDIOGRAM N/A 10/21/2013   Procedure: INTRAOPERATIVE TRANSESOPHAGEAL ECHOCARDIOGRAM;  Surgeon: Grace Isaac, MD;  Location: [place];  Service: Open Heart Surgery;  Laterality: N/A;  . LEFT HEART CATHETERIZATION WITH CORONARY ANGIOGRAM N/A 10/20/2013   Procedure: LEFT HEART CATHETERIZATION WITH CORONARY ANGIOGRAM;  Surgeon: Troy Sine, MD;  Location: [facility] CATH LAB;  Service: Cardiovascular;  Laterality: N/A;  . PARATHYROIDECTOMY       A IV Location/Drains/Wounds Patient Lines/Drains/Airways Status    Active Line/Drains/Airways    Name Placement date Placement time Site Days   Peripheral IV 08/13/2020 Left;Anterior Forearm 07/15/2020  2327  Forearm  1   Urethral Catheter Kim, RN Non-latex;Straight-tip 16 Fr. 08/11/2020  1930  Non-latex;Straight-tip  1   Incision 10/21/13 Chest Other (Comment) 10/21/13  0823   2469   Incision 10/21/13 Leg Right 10/21/13  0926   2469   Incision 10/21/13 Leg Left 10/21/13  1037   2469   Pressure Injury 05/16/20 Sacrum  Stage 1 -  Intact skin with non-blanchable redness of a localized area usually over a bony prominence. red non blanching 05/16/20  0930   70          Intake/Output Last 24 hours  Intake/Output Summary (Last 24 hours) at 07/25/2020 1553 Last data filed at 07/25/2020 1234 Gross per 24 hour  Intake 3 ml  Output 850 ml  Net  -847 ml    Labs/Imaging Results for orders placed or performed during the hospital encounter of 07/18/2020 (from the past 48 hour(s))  Urinalysis, Routine w reflex microscopic     Status: Abnormal   Collection Time: 07/21/2020  8:22 PM  Result Value Ref Range   Color, Urine BROWN (A) YELLOW    Comment: BIOCHEMICALS MAY BE AFFECTED BY COLOR   APPearance TURBID (A) CLEAR   Specific Gravity, Urine  1.005 - 1.030    TEST NOT REPORTED DUE TO COLOR INTERFERENCE OF URINE PIGMENT   pH  5.0 - 8.0    TEST NOT REPORTED DUE TO COLOR INTERFERENCE OF URINE PIGMENT   Glucose, UA (A) NEGATIVE mg/dL    TEST NOT REPORTED DUE TO COLOR INTERFERENCE OF URINE PIGMENT   Hgb urine dipstick (A) NEGATIVE    TEST NOT REPORTED DUE TO COLOR INTERFERENCE OF URINE PIGMENT   Bilirubin Urine (A) NEGATIVE    TEST NOT REPORTED DUE TO COLOR INTERFERENCE OF URINE PIGMENT   Ketones, ur (A) NEGATIVE mg/dL    TEST NOT REPORTED DUE TO COLOR INTERFERENCE OF URINE PIGMENT   Protein, ur (A) NEGATIVE mg/dL    TEST NOT REPORTED DUE TO COLOR INTERFERENCE OF URINE PIGMENT   Nitrite (A) NEGATIVE    TEST NOT REPORTED DUE TO COLOR INTERFERENCE OF URINE PIGMENT   Leukocytes,Ua (A) NEGATIVE    TEST NOT REPORTED DUE TO COLOR INTERFERENCE OF URINE PIGMENT    Comment: Performed at [facility], [address] [place], [place] [address]  Urinalysis, Microscopic (reflex)     Status: Abnormal   Collection Time: 08/13/2020  8:22 PM  Result Value Ref Range   RBC / HPF >50 0 - 5 RBC/hpf   WBC, UA >50 0 - 5 WBC/hpf   Bacteria, UA MANY (A) NONE SEEN   Squamous Epithelial / LPF NONE SEEN 0 - 5    Comment: Performed at [facility], [place] [address] [place], [place] [address]  Comprehensive metabolic panel     Status: Abnormal   Collection Time: 08/11/2020  8:56 PM  Result Value Ref Range   Sodium 136 135 - 145 mmol/L   Potassium 3.9 3.5 - 5.1 mmol/L   Chloride 94 (L) 98 - 111 mmol/L   CO2 30 22 - 32 mmol/L   Glucose, Bld 135 (H) 70  - 99 mg/dL    Comment: Glucose reference range applies only to samples taken after fasting for at least 8 hours.   BUN 12 8 - 23 mg/dL   Creatinine, Ser 0.77 0.61 - 1.24 mg/dL   Calcium 9.2 8.9 - 10.3 mg/dL   Total Protein 6.8 6.5 - 8.1 g/dL   Albumin 2.8 (L) 3.5 - 5.0 g/dL   AST 17 15 - 41 U/L   ALT 9 0 - 44 U/L   Alkaline Phosphatase 75 38 - 126 U/L   Total Bilirubin 1.1 0.3 - 1.2 mg/dL   GFR, Estimated >60 >60 mL/min   Anion gap 12 5 - 15    Comment: Performed at [facility], [address] [place], [place] [address]  CBC with Differential     Status: Abnormal   Collection Time: 07/26/2020  8:56 PM  Result Value Ref Range   WBC 10.2 4.0 - 10.5 K/uL   RBC 4.11 (L) 4.22 - 5.81 MIL/uL   Hemoglobin 12.5 (L) 13.0 - 17.0 g/dL   HCT 41.1 39 - 52 %   MCV 100.0 80.0 - 100.0 fL   MCH 30.4 26.0 - 34.0 pg   MCHC 30.4 30.0 - 36.0 g/dL   RDW 15.9 (H) 11.5 - 15.5 %   Platelets 263 150 - 400 K/uL   nRBC 0.0 0.0 - 0.2 %   Neutrophils Relative % 83 %   Neutro Abs 8.4 (H) 1.7 - 7.7 K/uL   Lymphocytes Relative 10 %   Lymphs Abs 1.1 0.7 - 4.0 K/uL   Monocytes Relative 5 %   Monocytes Absolute 0.6 0.1 - 1.0 K/uL   Eosinophils Relative 0 %   Eosinophils Absolute 0.0 0 - 0 K/uL   Basophils Relative 0 %   Basophils Absolute 0.0 0 - 0 K/uL   Immature Granulocytes 2 %   Abs Immature Granulocytes 0.15 (H) 0.00 - 0.07 K/uL    Comment: Performed at [facility] [address] [place], [place] [address]  Respiratory Panel by RT PCR (Flu A&B, Covid) - Nasopharyngeal Swab     Status: None   Collection Time: 08/04/2020 11:50 PM   Specimen: Nasopharyngeal Swab  Result Value Ref Range   SARS Coronavirus 2 by RT PCR NEGATIVE NEGATIVE    Comment: (NOTE) SARS-CoV-2 target nucleic acids are NOT DETECTED.  The SARS-CoV-2 RNA is generally detectable in upper respiratoy specimens during the acute phase of infection. The lowest concentration of SARS-CoV-2 viral copies this assay can detect  is 131 copies/mL. A negative result does not preclude SARS-Cov-2 infection and should not be used as the sole basis for treatment or other patient management decisions. A negative result may occur with  improper specimen collection/handling, submission of specimen other than nasopharyngeal swab, presence of viral mutation(s) within the areas targeted by this assay, and inadequate number of viral copies (<131 copies/mL). A negative result must be combined with clinical observations, patient history, and epidemiological information. The expected result is Negative.  Fact Sheet for Patients:  PinkCheek.be  Fact Sheet for Healthcare Providers:  GravelBags.it  This test is no t yet approved or cleared by the [place] FDA and  has been authorized for detection and/or diagnosis of SARS-CoV-2 by FDA under an Emergency Use Authorization (EUA). This EUA will remain  in effect (meaning this test can be used) for the duration of the COVID-19 declaration under Section 564(b)(1) of the Act, 21 U.S.C. section 360bbb-3(b)(1), unless the authorization is terminated or revoked sooner.     Influenza A by PCR NEGATIVE NEGATIVE   Influenza B by PCR NEGATIVE NEGATIVE    Comment: (NOTE) The Xpert Xpress SARS-CoV-2/FLU/RSV assay is intended as an aid in  the diagnosis of influenza from Nasopharyngeal swab specimens and  should not be used as a sole basis for treatment. Nasal washings and  aspirates are unacceptable for Xpert Xpress SARS-CoV-2/FLU/RSV  testing.  Fact Sheet for Patients: PinkCheek.be  Fact Sheet for Healthcare Providers: GravelBags.it  This test is not yet approved or cleared by the [place] FDA and  has been authorized for detection and/or diagnosis of SARS-CoV-2 by  FDA under an Emergency Use Authorization (EUA). This EUA will remain  in effect (meaning this  test can be used) for the duration of the  Covid-19 declaration under Section 564(b)(1) of the Act, 21  U.S.C. section 360bbb-3(b)(1), unless the authorization is  terminated or revoked. Performed at [facility], [place] [address], [place], [place] [address]   Brain natriuretic peptide     Status: Abnormal   Collection Time: 07/25/20  2:10 AM  Result Value Ref Range   B Natriuretic Peptide 763.1 (H) 0.0 - 100.0 pg/mL    Comment: Performed at [facility] [address] [place], [place] [address]  Basic metabolic panel     Status: Abnormal   Collection Time: 07/25/20  2:10 AM  Result Value Ref Range   Sodium 136 135 - 145 mmol/L   Potassium 4.6 3.5 - 5.1 mmol/L   Chloride 94 (L) 98 - 111 mmol/L   CO2 34 (H) 22 - 32 mmol/L   Glucose, Bld 136 (H) 70 - 99 mg/dL    Comment: Glucose reference range applies only to samples taken after fasting for at least 8 hours.   BUN 12 8 - 23 mg/dL   Creatinine, Ser 0.76 0.61 - 1.24 mg/dL   Calcium 8.9 8.9 - 10.3 mg/dL   GFR, Estimated >60 >60 mL/min   Anion gap 8 5 - 15    Comment: Performed at [facility] [address] [place], [place] [address]  CBC     Status: Abnormal   Collection Time: 07/25/20  2:10 AM  Result Value Ref Range   WBC 9.1 4.0 - 10.5 K/uL   RBC 3.53 (L) 4.22 - 5.81 MIL/uL   Hemoglobin 11.0 (L) 13.0 - 17.0 g/dL   HCT 35.6 (L) 39 - 52 %   MCV 100.8 (H) 80.0 - 100.0 fL   MCH 31.2 26.0 - 34.0 pg   MCHC 30.9 30.0 - 36.0 g/dL   RDW 15.8 (H) 11.5 - 15.5 %   Platelets 240 150 - 400 K/uL   nRBC 0.0 0.0 - 0.2 %    Comment: Performed at [facility], [place] [address], [place], [place] [address]  Magnesium     Status: None   Collection Time: 07/25/20  2:10 AM  Result Value Ref Range   Magnesium 2.0 1.7 - 2.4 mg/dL    Comment: Performed at [facility] [address] [place], [place] [address]   DG Chest 1 View  Result Date: 07/27/2020 CLINICAL DATA:  Fluid overload. COVID-19 viral pneumonia.  Coronary artery disease. EXAM: CHEST  1 VIEW COMPARISON:  05/15/2020 FINDINGS: Heart size is stable. Prior CABG again noted. Aortic atherosclerotic calcification noted. Moderate bilateral pleural effusions and bibasilar atelectasis are new since previous study. Extensive calcified pleural plaque again seen bilaterally. IMPRESSION: New moderate bilateral pleural effusions and bibasilar atelectasis. Extensive bilateral calcified pleural plaques, consistent with asbestos related pleural disease. Electronically Signed   By: Marlaine Hind M.D.   On: 08/07/2020 20:54   DG Chest Port 1 View  Result Date: 07/25/2020 CLINICAL DATA:  Hypoxia, pleural effusion EXAM: PORTABLE CHEST 1 VIEW COMPARISON:  08/12/2020 at 8:35 p.m. FINDINGS: Lung volumes are small, however, pulmonary insufflation remain stable since prior examination. Extensive bilateral calcified pleural plaques are again identified with associated pleural thickening compatible with this best is related pleural disease. Small left and moderate to large right pleural effusions are present, unchanged. Coronary artery bypass grafting and left atrial clipping has been performed. Cardiac size is mildly enlarged, unchanged. Pulmonary  vascularity is normal. No acute bone abnormality. IMPRESSION: Stable examination. Bilateral pleural effusions, moderate to large on the right and small on the left. Extensive asbestos related pleural disease again noted. Electronically Signed   By: Fidela Salisbury MD   On: 07/25/2020 00:19   ECHOCARDIOGRAM COMPLETE  Result Date: 07/25/2020    ECHOCARDIOGRAM REPORT   Patient Name:   [name] Date of Exam: 07/25/2020 Medical Rec #:  [id number]      Height:       66.0 in Accession #:    [id number]     Weight:       113.0 lb Date of Birth:  March 22, 1918     BSA:          1.569 m Patient Age:    74 years       BP:           93/67 mmHg Patient Gender: M              HR:           119 bpm. Exam Location:  Inpatient Procedure: 2D Echo,  Cardiac Doppler and Color Doppler Indications:    I50.23 Acute on chronic systolic (congestive) heart failure  History:        Patient has prior history of Echocardiogram examinations, most                 recent 04/21/2014. Prior CABG, Arrythmias:Atrial Fibrillation;                 Risk Factors:Hypertension. COVID-19.  Sonographer:    Jonelle Sidle Dance Referring Phys: [id number] [place]  1. Left ventricular ejection fraction, by estimation, is 50 to 55%. The left ventricle has low normal function. The left ventricle has no regional wall motion abnormalities. Left ventricular diastolic function could not be evaluated.  2. Right ventricular systolic function is mildly reduced. The right ventricular size is normal. There is mildly elevated pulmonary artery systolic pressure.  3. Left atrial size was moderately dilated.  4. Right atrial size was moderately dilated.  5. The mitral valve is abnormal. Mild mitral valve regurgitation. Moderate mitral annular calcification.  6. Tricuspid valve regurgitation is moderate.  7. The aortic valve is grossly normal. There is mild calcification of the aortic valve. There is mild thickening of the aortic valve. Aortic valve regurgitation is trivial. Mild aortic valve sclerosis is present, with no evidence of aortic valve stenosis.  8. The inferior vena cava is normal in size with <50% respiratory variability, suggesting right atrial pressure of 8 mmHg. Comparison(s): No prior Echocardiogram. FINDINGS  Left Ventricle: Left ventricular ejection fraction, by estimation, is 50 to 55%. The left ventricle has low normal function. The left ventricle has no regional wall motion abnormalities. The left ventricular internal cavity size was normal in size. There is borderline left ventricular hypertrophy. Abnormal (paradoxical) septal motion consistent with post-operative status. Left ventricular diastolic function could not be evaluated due to atrial fibrillation. Left  ventricular diastolic function could  not be evaluated. Right Ventricle: The right ventricular size is normal. No increase in right ventricular wall thickness. Right ventricular systolic function is mildly reduced. There is mildly elevated pulmonary artery systolic pressure. The tricuspid regurgitant velocity  is 2.88 m/s, and with an assumed right atrial pressure of 8 mmHg, the estimated right ventricular systolic pressure is 09.9 mmHg. Left Atrium: Left atrial size was moderately dilated. Right Atrium: Right atrial size was moderately dilated. Pericardium: There is no evidence  of pericardial effusion. Mitral Valve: The mitral valve is abnormal. There is moderate thickening of the mitral valve leaflet(s). There is moderate calcification of the mitral valve leaflet(s). Moderate mitral annular calcification. Mild mitral valve regurgitation. Tricuspid Valve: The tricuspid valve is normal in structure. Tricuspid valve regurgitation is moderate . No evidence of tricuspid stenosis. Aortic Valve: The aortic valve is grossly normal. There is mild calcification of the aortic valve. There is mild thickening of the aortic valve. Aortic valve regurgitation is trivial. Mild aortic valve sclerosis is present, with no evidence of aortic valve stenosis. Aortic valve mean gradient measures 3.0 mmHg. Aortic valve peak gradient measures 6.0 mmHg. Pulmonic Valve: The pulmonic valve was not well visualized. Pulmonic valve regurgitation is not visualized. Aorta: The aortic root and ascending aorta are structurally normal, with no evidence of dilitation. Venous: The inferior vena cava is normal in size with less than 50% respiratory variability, suggesting right atrial pressure of 8 mmHg. IAS/Shunts: The atrial septum is grossly normal.  LEFT VENTRICLE PLAX 2D LVIDd:         4.00 cm LVIDs:         2.95 cm LV PW:         1.24 cm LV IVS:        1.13 cm  RIGHT VENTRICLE          IVC RV Basal diam:  3.25 cm  IVC diam: 2.01 cm RV Mid diam:     2.23 cm TAPSE (M-mode): 1.1 cm LEFT ATRIUM             Index       RIGHT ATRIUM           Index LA diam:        5.20 cm 3.31 cm/m  RA Area:     21.50 cm LA Vol (A2C):   60.8 ml 38.75 ml/m RA Volume:   55.90 ml  35.63 ml/m LA Vol (A4C):   61.9 ml 39.45 ml/m LA Biplane Vol: 60.8 ml 38.75 ml/m  AORTIC VALVE AV Vmax:           122.00 cm/s AV Vmean:          81.900 cm/s AV VTI:            0.201 m AV Peak Grad:      6.0 mmHg AV Mean Grad:      3.0 mmHg LVOT Vmax:         53.55 cm/s LVOT Vmean:        32.300 cm/s LVOT VTI:          0.086 m LVOT/AV VTI ratio: 0.43  AORTA Ao Asc diam: 3.10 cm MITRAL VALVE               TRICUSPID VALVE MV Area (PHT): 4.89 cm    TR Peak grad:   33.2 mmHg MV Decel Time: 155 msec    TR Vmax:        288.00 cm/s MV E velocity: 91.95 cm/s                            SHUNTS                            Systemic VTI: 0.09 m Buford Dresser MD Electronically signed by Buford Dresser MD Signature Date/Time: 07/25/2020/3:01:13 PM    Final     Pending Labs Unresulted  Labs (From admission, onward)          Start     Ordered   07/31/20 0500  Creatinine, serum  (enoxaparin (LOVENOX)    CrCl >/= 30 ml/min)  Weekly,   R     Comments: while on enoxaparin therapy    07/25/20 0001   07/26/20 0500  CBC with Differential/Platelet  Daily,   R      07/25/20 1005   07/26/20 0500  Comprehensive metabolic panel  Daily,   R      07/25/20 1005          Vitals/Pain Today's Vitals   07/25/20 1415 07/25/20 1430 07/25/20 1445 07/25/20 1500  BP: 101/64 (!) 94/51 100/63 112/76  Pulse: 80 (!) 54 82 (!) 157  Resp: 14 15 18  (!) 30  Temp:      TempSrc:      SpO2: 100% 100% 99% 90%  PainSc:        Isolation Precautions No active isolations  Medications Medications  tamsulosin (FLOMAX) capsule 0.4 mg (0.4 mg Oral Not Given 07/25/20 1000)  multivitamin with minerals tablet 1 tablet (0 tablets Oral Hold 07/25/20 0959)  enoxaparin (LOVENOX) injection 30 mg (30 mg Subcutaneous  Given 07/25/20 1336)  sodium chloride flush (NS) 0.9 % injection 3 mL (3 mLs Intravenous Given 07/25/20 1234)  acetaminophen (TYLENOL) tablet 650 mg (has no administration in time range)    Or  acetaminophen (TYLENOL) suppository 650 mg (has no administration in time range)  furosemide (LASIX) injection 20 mg (has no administration in time range)  diltiazem (CARDIZEM) 125 mg in dextrose 5% 125 mL (1 mg/mL) infusion (5 mg/hr Intravenous New Bag/Given 07/25/20 1228)  furosemide (LASIX) injection 40 mg (40 mg Intravenous Given 08/06/2020 2352)    Mobility non-ambulatory High fall risk   Focused Assessments Cardiac Assessment Handoff:  Cardiac Rhythm: Atrial fibrillation Lab Results  Component Value Date   CKTOTAL 150 05/15/2020   CKMB 1.7 08/15/2010   TROPONINI 0.06 (HH) 01/10/2019   Lab Results  Component Value Date   DDIMER 3.04 (H) 05/16/2020   Does the Patient currently have chest pain? No     R Recommendations: See Admitting Provider Note  Report given to:   Additional Notes: Foley placed here due to issues at the facility. Currently in A.fib on Cardizem.

## 2020-07-25 NOTE — Progress Notes (Signed)
Update family daily please:  Kosta Schnitzler [facility]) @ [phone]

## 2020-07-26 ENCOUNTER — Inpatient Hospital Stay (HOSPITAL_COMMUNITY): Payer: Medicare Other

## 2020-07-26 DIAGNOSIS — E8779 Other fluid overload: Secondary | ICD-10-CM | POA: Diagnosis not present

## 2020-07-26 LAB — COMPREHENSIVE METABOLIC PANEL
ALT: 7 U/L (ref 0–44)
AST: 12 U/L — ABNORMAL LOW (ref 15–41)
Albumin: 2.1 g/dL — ABNORMAL LOW (ref 3.5–5.0)
Alkaline Phosphatase: 56 U/L (ref 38–126)
Anion gap: 12 (ref 5–15)
BUN: 8 mg/dL (ref 8–23)
CO2: 33 mmol/L — ABNORMAL HIGH (ref 22–32)
Calcium: 8.6 mg/dL — ABNORMAL LOW (ref 8.9–10.3)
Chloride: 93 mmol/L — ABNORMAL LOW (ref 98–111)
Creatinine, Ser: 0.81 mg/dL (ref 0.61–1.24)
GFR, Estimated: >60 mL/min (ref >60)
Glucose, Bld: 109 mg/dL — ABNORMAL HIGH (ref 70–99)
Potassium: 3.1 mmol/L — ABNORMAL LOW (ref 3.5–5.1)
Sodium: 138 mmol/L (ref 135–145)
Total Bilirubin: 2.3 mg/dL — ABNORMAL HIGH (ref 0.3–1.2)
Total Protein: 5.5 g/dL — ABNORMAL LOW (ref 6.5–8.1)

## 2020-07-26 LAB — CBC WITH DIFFERENTIAL/PLATELET
Abs Immature Granulocytes: 0.5 10*3/uL — ABNORMAL HIGH (ref 0.00–0.07)
Basophils Absolute: 0 10*3/uL (ref 0.0–0.1)
Basophils Relative: 0 %
Eosinophils Absolute: 0 10*3/uL (ref 0.0–0.5)
Eosinophils Relative: 0 %
HCT: 34.8 % — ABNORMAL LOW (ref 39.0–52.0)
Hemoglobin: 10.8 g/dL — ABNORMAL LOW (ref 13.0–17.0)
Immature Granulocytes: 4 %
Lymphocytes Relative: 4 %
Lymphs Abs: 0.6 10*3/uL — ABNORMAL LOW (ref 0.7–4.0)
MCH: 30.5 pg (ref 26.0–34.0)
MCHC: 31 g/dL (ref 30.0–36.0)
MCV: 98.3 fL (ref 80.0–100.0)
Monocytes Absolute: 0.6 10*3/uL (ref 0.1–1.0)
Monocytes Relative: 5 %
Neutro Abs: 11.4 10*3/uL — ABNORMAL HIGH (ref 1.7–7.7)
Neutrophils Relative %: 87 %
Platelets: 219 10*3/uL (ref 150–400)
RBC: 3.54 MIL/uL — ABNORMAL LOW (ref 4.22–5.81)
RDW: 15.6 % — ABNORMAL HIGH (ref 11.5–15.5)
WBC: 13.1 10*3/uL — ABNORMAL HIGH (ref 4.0–10.5)
nRBC: 0 % (ref 0.0–0.2)

## 2020-07-26 MED ORDER — DIGOXIN 0.25 MG/ML IJ SOLN
0.1250 mg | Freq: Every day | INTRAMUSCULAR | Status: DC
  Administered 2020-07-27: 0.125 mg via INTRAVENOUS
  Filled 2020-07-26: qty 0.5

## 2020-07-26 MED ORDER — DIGOXIN 0.25 MG/ML IJ SOLN
0.2500 mg | Freq: Four times a day (QID) | INTRAMUSCULAR | Status: AC
  Administered 2020-07-26 (×2): 0.25 mg via INTRAVENOUS
  Filled 2020-07-26 (×2): qty 1

## 2020-07-26 NOTE — Plan of Care (Signed)
Continue to monitor

## 2020-07-26 NOTE — Progress Notes (Signed)
Patient's HR 140-150's. BP 107/61. Digoxin given at this time.  Patient asymptomatic and sleeping. Dr. Verlon Au at bedside. Will continue to monitor.

## 2020-07-26 NOTE — Progress Notes (Signed)
PROGRESS NOTE    [name]  CZY:[id number] DOB: [birth year] DOA: 07/19/2020 PCP: Lajean Manes, MD  Brief Narrative:   86 year old [place] [facility]--at baseline he cannot stand /sit-his mental state had been ok but in the last 3 days he was hallucinating and memory was bad---poor memory at baseline but not overtly Stemi s/p CABG x 4 done 10/21/2013 Permanent A. fib followed in the past by Dr. Caryl Comes status post unsuccessful DCCV Prior cholecystitis 2020 Positive COVID without symptoms-19 05/2020--at that admission found to have acute subdural hemorrhage in addition and sacral decubitus stage I in addition to AKI Some underlying dementia Admit from [facility] 08/13/2020 with blood from Foley catheter inability to reinflate as well as mild confusion Also found to have volume overload in ED CBC hemoglobin 12.5  Assessment & Plan:   Active Problems:   Essential hypertension, benign   S/P CABG x 4 with atrial clip   Chronic atrial fibrillation (HCC)   CAD (coronary artery disease)   Acute exacerbation of CHF (congestive heart failure) ([place])   Heart failure ([place])   1. Acute decompensated systolic heart failure a. Home medication is Lasix 40 daily as needed and or for weight gain which has been increased to Lasix IV twice daily 40 mg b. Could not give high-dose Lasix instead using low-dose IV 20 mg c. Last discharge weight is unclear d. CXR 10/12 increasing effusion and possibly has pneumonia as below e. echocardiogram 07/1149 EF 55% mild elevated pulmonary pressures 2. Possible subclinical aspiration a. White count is up from 9-13 and oxygen requirements are increased to 4 L daily b. There is a possible concern of aspiration in addition to systolic heart failure c. Start Zosyn for these concerns 3. CABG 03/18/2014 4. Permanent A. fib 5. HTN a. Home meds are Cardizem 240 every morning and metoprolol 50 twice daily b. Cannot give oral meds as patient somnolent-continue Cardizem  gtt. per orders currently at 7.5 c. Rate is now controlled and in the 130s so adding digoxin low-dose 0.25 every 6x2 doses and a.m. dosing 0.125 IV 6. Blood from urinary catheter-Foley placed around last admission 05/2020 secondary to urinary retention a. Last admission it was felt feasible to withhold Foley catheter patient was supposed to follow-up with urologist Leonia Reader pace b. Continue to monitor trends and if further bleeding will need to speak to urologist c. Continue Flomax 0.4 daily 7. Underlying encephalopathy secondary probably to pneumonia and hypoxia a. Multifactorial secondary to hypoxia etc. --also subacute delirium in addition b. Holding sertraline at this time c. May need Ativan if becomes combative 8. Prior Covid infection 05/2020 a. History of was asymptomatic 9. Advanced age with delirium dementia a. Confused and not very arousable b. Will ask palliaitve care to see patient later   DVT prophylaxis: Lovenox Code Status: Partial code Family Communication: Called son [name] at [phone] 10/12 and updated Disposition:   Status is: Inpatient  Remains inpatient appropriate because:Ongoing active pain requiring inpatient pain management, Ongoing diagnostic testing needed not appropriate for outpatient work up, Unsafe d/c plan and IV treatments appropriate due to intensity of illness or inability to take PO   Dispo: The patient is from: SNF              Anticipated d/c is to: SNF              Anticipated d/c date is: 3 days              Patient currently is not medically  stable to d/c.   Consultants:   None currently  Procedures: none  Antimicrobials: none currently    Subjective:  Somewhat sleepy and only groans in response to stimulation Cannot get a history He is tachycardic to the 150s Speech could not see him today and do not think he is appropriate to have a diet and is n.p.o.    Objective: Vitals:   07/26/20 0600 07/26/20 0800 07/26/20 1035  07/26/20 1100  BP: (!) 94/50 108/76 107/61 126/69  Pulse: (!) 136 (!) 145 (!) 140   Resp: 16 (!) 24 20   Temp:  98 F (36.7 C)    TempSrc:  Oral    SpO2: 100% 97% 100%   Weight:        Intake/Output Summary (Last 24 hours) at 07/26/2020 1158 Last data filed at 07/26/2020 0400 Gross per 24 hour  Intake 105.71 ml  Output 1500 ml  Net -1394.29 ml   Filed Weights   07/26/20 0500  Weight: 60.6 kg    Examination:  General exam: Awake groaning in discomfort when agitated Respiratory system: Some rales Cardiovascular system: S1-S2 no murmur tachycardic close to 150s at times Gastrointestinal system: Soft nontender no rebound no guarding. Skin: Grade 2 lower extremity edema Psychiatry: Cannot assess and cannot assess neurological function  Data Reviewed: I have personally reviewed following labs and imaging studies  Potassium down from 4.6-3.1 Creatinine 8/0.8 bicarb 33 White count up from 9-13  hemoglobin down from 11.0 to 10.8, Platelet count 219  Radiology Studies: DG Chest 1 View  Result Date: 08/05/2020 CLINICAL DATA:  Fluid overload. COVID-19 viral pneumonia. Coronary artery disease. EXAM: CHEST  1 VIEW COMPARISON:  05/15/2020 FINDINGS: Heart size is stable. Prior CABG again noted. Aortic atherosclerotic calcification noted. Moderate bilateral pleural effusions and bibasilar atelectasis are new since previous study. Extensive calcified pleural plaque again seen bilaterally. IMPRESSION: New moderate bilateral pleural effusions and bibasilar atelectasis. Extensive bilateral calcified pleural plaques, consistent with asbestos related pleural disease. Electronically Signed   By: Marlaine Hind M.D.   On: 07/29/2020 20:54   DG Chest 2 View  Result Date: 07/26/2020 CLINICAL DATA:  Pneumonia EXAM: CHEST - 2 VIEW COMPARISON:  07/25/2020 FINDINGS: Similar bilateral pleural effusions, right greater than left with associated atelectasis. Pulmonary vascular congestion. Extensive  calcified pleural plaques reflecting asbestos related disease. Similar cardiomediastinal contours. IMPRESSION: No significant change with persistent right greater than left pleural effusions and associated atelectasis. Pulmonary vascular congestion. Electronically Signed   By: Macy Mis M.D.   On: 07/26/2020 08:10   DG Chest Port 1 View  Result Date: 07/25/2020 CLINICAL DATA:  Hypoxia, pleural effusion EXAM: PORTABLE CHEST 1 VIEW COMPARISON:  07/15/2020 at 8:35 p.m. FINDINGS: Lung volumes are small, however, pulmonary insufflation remain stable since prior examination. Extensive bilateral calcified pleural plaques are again identified with associated pleural thickening compatible with this best is related pleural disease. Small left and moderate to large right pleural effusions are present, unchanged. Coronary artery bypass grafting and left atrial clipping has been performed. Cardiac size is mildly enlarged, unchanged. Pulmonary vascularity is normal. No acute bone abnormality. IMPRESSION: Stable examination. Bilateral pleural effusions, moderate to large on the right and small on the left. Extensive asbestos related pleural disease again noted. Electronically Signed   By: Fidela Salisbury MD   On: 07/25/2020 00:19   ECHOCARDIOGRAM COMPLETE  Result Date: 07/25/2020    ECHOCARDIOGRAM REPORT   Patient Name:   [name] Date of Exam: 07/25/2020 Medical Rec #:  [id number]      Height:       66.0 in Accession #:    [id number]     Weight:       113.0 lb Date of Birth:  [birth year]     BSA:          1.569 m Patient Age:    14 years       BP:           93/67 mmHg Patient Gender: M              HR:           119 bpm. Exam Location:  Inpatient Procedure: 2D Echo, Cardiac Doppler and Color Doppler Indications:    I50.23 Acute on chronic systolic (congestive) heart failure  History:        Patient has prior history of Echocardiogram examinations, most                 recent 04/21/2014. Prior CABG,  Arrythmias:Atrial Fibrillation;                 Risk Factors:Hypertension. COVID-19.  Sonographer:    Jonelle Sidle Dance Referring Phys: [id number] [place]  1. Left ventricular ejection fraction, by estimation, is 50 to 55%. The left ventricle has low normal function. The left ventricle has no regional wall motion abnormalities. Left ventricular diastolic function could not be evaluated.  2. Right ventricular systolic function is mildly reduced. The right ventricular size is normal. There is mildly elevated pulmonary artery systolic pressure.  3. Left atrial size was moderately dilated.  4. Right atrial size was moderately dilated.  5. The mitral valve is abnormal. Mild mitral valve regurgitation. Moderate mitral annular calcification.  6. Tricuspid valve regurgitation is moderate.  7. The aortic valve is grossly normal. There is mild calcification of the aortic valve. There is mild thickening of the aortic valve. Aortic valve regurgitation is trivial. Mild aortic valve sclerosis is present, with no evidence of aortic valve stenosis.  8. The inferior vena cava is normal in size with <50% respiratory variability, suggesting right atrial pressure of 8 mmHg. Comparison(s): No prior Echocardiogram. FINDINGS  Left Ventricle: Left ventricular ejection fraction, by estimation, is 50 to 55%. The left ventricle has low normal function. The left ventricle has no regional wall motion abnormalities. The left ventricular internal cavity size was normal in size. There is borderline left ventricular hypertrophy. Abnormal (paradoxical) septal motion consistent with post-operative status. Left ventricular diastolic function could not be evaluated due to atrial fibrillation. Left ventricular diastolic function could  not be evaluated. Right Ventricle: The right ventricular size is normal. No increase in right ventricular wall thickness. Right ventricular systolic function is mildly reduced. There is mildly elevated  pulmonary artery systolic pressure. The tricuspid regurgitant velocity  is 2.88 m/s, and with an assumed right atrial pressure of 8 mmHg, the estimated right ventricular systolic pressure is 47.4 mmHg. Left Atrium: Left atrial size was moderately dilated. Right Atrium: Right atrial size was moderately dilated. Pericardium: There is no evidence of pericardial effusion. Mitral Valve: The mitral valve is abnormal. There is moderate thickening of the mitral valve leaflet(s). There is moderate calcification of the mitral valve leaflet(s). Moderate mitral annular calcification. Mild mitral valve regurgitation. Tricuspid Valve: The tricuspid valve is normal in structure. Tricuspid valve regurgitation is moderate . No evidence of tricuspid stenosis. Aortic Valve: The aortic valve is grossly normal. There is mild calcification of the aortic valve. There is mild  thickening of the aortic valve. Aortic valve regurgitation is trivial. Mild aortic valve sclerosis is present, with no evidence of aortic valve stenosis. Aortic valve mean gradient measures 3.0 mmHg. Aortic valve peak gradient measures 6.0 mmHg. Pulmonic Valve: The pulmonic valve was not well visualized. Pulmonic valve regurgitation is not visualized. Aorta: The aortic root and ascending aorta are structurally normal, with no evidence of dilitation. Venous: The inferior vena cava is normal in size with less than 50% respiratory variability, suggesting right atrial pressure of 8 mmHg. IAS/Shunts: The atrial septum is grossly normal.  LEFT VENTRICLE PLAX 2D LVIDd:         4.00 cm LVIDs:         2.95 cm LV PW:         1.24 cm LV IVS:        1.13 cm  RIGHT VENTRICLE          IVC RV Basal diam:  3.25 cm  IVC diam: 2.01 cm RV Mid diam:    2.23 cm TAPSE (M-mode): 1.1 cm LEFT ATRIUM             Index       RIGHT ATRIUM           Index LA diam:        5.20 cm 3.31 cm/m  RA Area:     21.50 cm LA Vol (A2C):   60.8 ml 38.75 ml/m RA Volume:   55.90 ml  35.63 ml/m LA Vol (A4C):    61.9 ml 39.45 ml/m LA Biplane Vol: 60.8 ml 38.75 ml/m  AORTIC VALVE AV Vmax:           122.00 cm/s AV Vmean:          81.900 cm/s AV VTI:            0.201 m AV Peak Grad:      6.0 mmHg AV Mean Grad:      3.0 mmHg LVOT Vmax:         53.55 cm/s LVOT Vmean:        32.300 cm/s LVOT VTI:          0.086 m LVOT/AV VTI ratio: 0.43  AORTA Ao Asc diam: 3.10 cm MITRAL VALVE               TRICUSPID VALVE MV Area (PHT): 4.89 cm    TR Peak grad:   33.2 mmHg MV Decel Time: 155 msec    TR Vmax:        288.00 cm/s MV E velocity: 91.95 cm/s                            SHUNTS                            Systemic VTI: 0.09 m Buford Dresser MD Electronically signed by Buford Dresser MD Signature Date/Time: 07/25/2020/3:01:13 PM    Final      Scheduled Meds: . chlorhexidine  15 mL Mouth Rinse BID  . Chlorhexidine Gluconate Cloth  6 each Topical Daily  . [START ON 07/27/2020] digoxin  0.125 mg Intravenous Daily  . digoxin  0.25 mg Intravenous Q6H  . enoxaparin (LOVENOX) injection  30 mg Subcutaneous Daily  . furosemide  20 mg Intravenous Daily  . mouth rinse  15 mL Mouth Rinse q12n4p  . multivitamin with minerals  1 tablet Oral Daily  .  sodium chloride flush  3 mL Intravenous Q12H  . tamsulosin  0.4 mg Oral Daily   Continuous Infusions: . diltiazem (CARDIZEM) infusion 7.5 mg/hr (07/25/20 1940)     LOS: 1 day    Time spent: McG[place]or, MD [facility] To contact the attending provider between 7A-7P or the covering provider during after hours 7P-7A, please log into the web site [email or web address] and access using universal [place] password for that web site. If you do not have the password, please call the hospital operator.  07/26/2020, 11:58 AM

## 2020-07-26 NOTE — Progress Notes (Signed)
SLP Cancellation Note  Patient Details Name: [name] MRN: [id number] DOB: September 22, 1918   Cancelled treatment:       Reason Eval/Treat Not Completed: Patient's level of consciousness. Patient not arousable with MD or with SLP; eyes remained closed and patient vocalizing "ahh" but with no communicative intent. HR at rest fluctuating between 120-155, but RR in range of 22-24 and SpO2 % in range of 95-97%. SLP will f/u with nursing to determine when patient able to participate in bedside swallow evaluation. Continue NPO status.   Sonia Baller, MA, CCC-SLP Speech Therapy

## 2020-07-27 DIAGNOSIS — I482 Chronic atrial fibrillation, unspecified: Secondary | ICD-10-CM

## 2020-07-27 DIAGNOSIS — I2510 Atherosclerotic heart disease of native coronary artery without angina pectoris: Secondary | ICD-10-CM

## 2020-07-27 DIAGNOSIS — I10 Essential (primary) hypertension: Secondary | ICD-10-CM

## 2020-07-27 DIAGNOSIS — I5043 Acute on chronic combined systolic (congestive) and diastolic (congestive) heart failure: Secondary | ICD-10-CM

## 2020-07-27 DIAGNOSIS — J690 Pneumonitis due to inhalation of food and vomit: Secondary | ICD-10-CM

## 2020-07-27 DIAGNOSIS — I2583 Coronary atherosclerosis due to lipid rich plaque: Secondary | ICD-10-CM

## 2020-07-27 DIAGNOSIS — I5023 Acute on chronic systolic (congestive) heart failure: Secondary | ICD-10-CM

## 2020-07-27 DIAGNOSIS — U099 Post covid-19 condition, unspecified: Secondary | ICD-10-CM

## 2020-07-27 LAB — CBC WITH DIFFERENTIAL/PLATELET
Abs Immature Granulocytes: 0.37 10*3/uL — ABNORMAL HIGH (ref 0.00–0.07)
Basophils Absolute: 0 10*3/uL (ref 0.0–0.1)
Basophils Relative: 0 %
Eosinophils Absolute: 0 10*3/uL (ref 0.0–0.5)
Eosinophils Relative: 0 %
HCT: 32.4 % — ABNORMAL LOW (ref 39.0–52.0)
Hemoglobin: 10.3 g/dL — ABNORMAL LOW (ref 13.0–17.0)
Immature Granulocytes: 4 %
Lymphocytes Relative: 7 %
Lymphs Abs: 0.8 10*3/uL (ref 0.7–4.0)
MCH: 30.9 pg (ref 26.0–34.0)
MCHC: 31.8 g/dL (ref 30.0–36.0)
MCV: 97.3 fL (ref 80.0–100.0)
Monocytes Absolute: 0.5 10*3/uL (ref 0.1–1.0)
Monocytes Relative: 5 %
Neutro Abs: 8.8 10*3/uL — ABNORMAL HIGH (ref 1.7–7.7)
Neutrophils Relative %: 84 %
Platelets: 211 10*3/uL (ref 150–400)
RBC: 3.33 MIL/uL — ABNORMAL LOW (ref 4.22–5.81)
RDW: 15.3 % (ref 11.5–15.5)
WBC: 10.5 10*3/uL (ref 4.0–10.5)
nRBC: 0 % (ref 0.0–0.2)

## 2020-07-27 LAB — COMPREHENSIVE METABOLIC PANEL
ALT: 9 U/L (ref 0–44)
AST: 11 U/L — ABNORMAL LOW (ref 15–41)
Albumin: 1.9 g/dL — ABNORMAL LOW (ref 3.5–5.0)
Alkaline Phosphatase: 54 U/L (ref 38–126)
Anion gap: 10 (ref 5–15)
BUN: 10 mg/dL (ref 8–23)
CO2: 37 mmol/L — ABNORMAL HIGH (ref 22–32)
Calcium: 8.5 mg/dL — ABNORMAL LOW (ref 8.9–10.3)
Chloride: 92 mmol/L — ABNORMAL LOW (ref 98–111)
Creatinine, Ser: 0.84 mg/dL (ref 0.61–1.24)
GFR, Estimated: >60 mL/min (ref >60)
Glucose, Bld: 119 mg/dL — ABNORMAL HIGH (ref 70–99)
Potassium: 2.9 mmol/L — ABNORMAL LOW (ref 3.5–5.1)
Sodium: 139 mmol/L (ref 135–145)
Total Bilirubin: 2.4 mg/dL — ABNORMAL HIGH (ref 0.3–1.2)
Total Protein: 5.3 g/dL — ABNORMAL LOW (ref 6.5–8.1)

## 2020-07-27 LAB — DIGOXIN LEVEL: Digoxin Level: 1 ng/mL (ref 1.0–2.0)

## 2020-07-27 MED ORDER — MORPHINE SULFATE (PF) 2 MG/ML IV SOLN
1.0000 mg | INTRAVENOUS | Status: DC | PRN
  Administered 2020-07-28: 2 mg via INTRAVENOUS
  Filled 2020-07-27: qty 1

## 2020-07-27 MED ORDER — OXYBUTYNIN CHLORIDE 5 MG PO TABS: 2.5000 mg | ORAL_TABLET | Freq: Four times a day (QID) | ORAL | Status: DC | PRN

## 2020-07-27 MED ORDER — ONDANSETRON HCL 4 MG/2ML IJ SOLN: 4.0000 mg | Freq: Four times a day (QID) | INTRAMUSCULAR | Status: DC | PRN

## 2020-07-27 MED ORDER — ACETAMINOPHEN 650 MG RE SUPP: 650.0000 mg | Freq: Four times a day (QID) | RECTAL | Status: DC | PRN

## 2020-07-27 MED ORDER — LOPERAMIDE HCL 2 MG PO CAPS: 2.0000 mg | ORAL_CAPSULE | ORAL | Status: DC | PRN

## 2020-07-27 MED ORDER — GLYCOPYRROLATE 0.2 MG/ML IJ SOLN
0.2000 mg | INTRAMUSCULAR | Status: DC | PRN
  Administered 2020-07-29: 0.2 mg via INTRAVENOUS
  Filled 2020-07-27: qty 1

## 2020-07-27 MED ORDER — DIPHENHYDRAMINE HCL 50 MG/ML IJ SOLN: 12.5000 mg | INTRAMUSCULAR | Status: DC | PRN

## 2020-07-27 MED ORDER — OLANZAPINE 5 MG PO TBDP
5.0000 mg | ORAL_TABLET | Freq: Every day | ORAL | Status: DC
  Administered 2020-07-27 – 2020-07-29 (×3): 5 mg via ORAL
  Filled 2020-07-27 (×5): qty 1

## 2020-07-27 MED ORDER — GLYCOPYRROLATE 0.2 MG/ML IJ SOLN: 0.2000 mg | INTRAMUSCULAR | Status: DC | PRN

## 2020-07-27 MED ORDER — LORAZEPAM 1 MG PO TABS: 1.0000 mg | ORAL_TABLET | ORAL | Status: DC | PRN

## 2020-07-27 MED ORDER — POTASSIUM CHLORIDE CRYS ER 20 MEQ PO TBCR
40.0000 meq | EXTENDED_RELEASE_TABLET | Freq: Once | ORAL | Status: DC
  Filled 2020-07-27: qty 2

## 2020-07-27 MED ORDER — LORAZEPAM 2 MG/ML IJ SOLN: 1.0000 mg | INTRAMUSCULAR | Status: DC | PRN

## 2020-07-27 MED ORDER — GLYCOPYRROLATE 1 MG PO TABS
1.0000 mg | ORAL_TABLET | ORAL | Status: DC | PRN
  Filled 2020-07-27: qty 1

## 2020-07-27 MED ORDER — ONDANSETRON 4 MG PO TBDP: 4.0000 mg | ORAL_TABLET | Freq: Four times a day (QID) | ORAL | Status: DC | PRN

## 2020-07-27 MED ORDER — BIOTENE DRY MOUTH MT LIQD: 15.0000 mL | OROMUCOSAL | Status: DC | PRN

## 2020-07-27 MED ORDER — POLYVINYL ALCOHOL 1.4 % OP SOLN
1.0000 [drp] | Freq: Four times a day (QID) | OPHTHALMIC | Status: DC | PRN
  Filled 2020-07-27: qty 15

## 2020-07-27 MED ORDER — BISACODYL 10 MG RE SUPP: 10.0000 mg | Freq: Every day | RECTAL | Status: DC | PRN

## 2020-07-27 MED ORDER — ACETAMINOPHEN 325 MG PO TABS: 650.0000 mg | ORAL_TABLET | Freq: Four times a day (QID) | ORAL | Status: DC | PRN

## 2020-07-27 MED ORDER — LORAZEPAM 2 MG/ML PO CONC: 1.0000 mg | ORAL | Status: DC | PRN

## 2020-07-27 NOTE — Progress Notes (Signed)
SLP Cancellation Note  Patient Details Name: [name] MRN: [id number] DOB: 1918/05/22   Cancelled treatment:       Reason Eval/Treat Not Completed: Other (comment) (Pt has been transitioned to comfort care and evaluation order cancelled by Dr. Benny Lennert,)  Tobie Poet I. Hardin Negus, [place], [place] Office number [phone] Pager [place] 07/27/2020, 12:02 PM

## 2020-07-27 NOTE — Plan of Care (Signed)
Continue to monitor

## 2020-07-27 NOTE — Progress Notes (Signed)
PMT consult received and chart reviewed. Discussed with Dr. Benny Lennert who had extensive conversation with patient's son, [name]. Goals are clear for comfort measures and residential hospice placement. Placed TOC referral. Per attending, no palliative or symptom needs at this time. Thank you.   NO CHARGE  Ihor Dow, [place], FNP-C Palliative Medicine Team  Phone: [phone] Fax: [phone]

## 2020-07-27 NOTE — Progress Notes (Signed)
PROGRESS NOTE  [name] PXT:[id number] DOB: 1918-02-23 DOA: 07/18/2020 PCP: Lajean Manes, MD  Brief History   The patient is a 86 yr old man with progressive weakness particularly in the weeks following his diagnosis with COVID-19. Although his symptoms were very minor this has been accompanied by significant cognitive decline as described by the patient's son as well as progressive weakness. He was also found to have acute SDH on that 05/2020 admission. On 07/21/2020 the patient was admitted from [facility] with volume overload, increased confusion, and foley catheter malfunction. The patient has remained hypotensive and has not been able to tolerate significant diuresis.   The patient remains very weak and confused. Meaningful return to his prior level of functioning is unlikely. He has been largely bed bound since that hospitalization in August. They do not wish for escalation of care, and do not wish for him to return to the hospital. [name] understands that we are unable to keep fluid off of the patient and that this will complicate his respiratory status. He has not responded to IV antibiotics or attempts to diurese him. He remains tachycardic, tachypneic, and confused. He is very weak and has previously refused attempts at rehabilitation through PT.  I have discussed this at length with his son, [name] who has decided to make the patient a DNR and to transfer him over from acute care to comfort care. Hospice has been consulted. Consultants  . Palliative care  Procedures  . None  Antibiotics   Anti-infectives (From admission, onward)   None    .  Subjective  The patient is lying in bed. He is confused and cachectic. He has no new complaints and denies pain.  Objective   Vitals:  Vitals:   07/27/20 0749 07/27/20 1200  BP: (!) 117/53 (!) 126/54  Pulse: 100 (!) 103  Resp: 20 20  Temp: 98.1 F (36.7 C) 98.3 F (36.8 C)  SpO2: 100% 97%   Exam:  Constitutional:  . The  patient is awake and alert, but confused. He is elderly, weak, frail, and cachectic. Respiratory:  . No increased work of breathing. . No wheezes, rales, or rhonchi . No tactile fremitus Cardiovascular:  . Regular rate and rhythm . No murmurs, ectopy, or gallups. . No lateral PMI. No thrills. Abdomen:  . Abdomen is soft, non-tender, non-distended . No hernias, masses, or organomegaly . Normoactive bowel sounds.  Musculoskeletal:  . No cyanosis or clubbing. . 2-3+ pitting edema of lower extremities bilaterally. Skin:  . No rashes, lesions, ulcers . palpation of skin: no induration or nodules Neurologic:  . CN 2-12 intact . Sensation all 4 extremities intact Psychiatric:  . Mental status o Mood, affect appropriate o Orientation to person, place, time  . judgment and insight appear intact  I have personally reviewed the following:   Today's Data  . Vitals, BMP, CBC, BNP  Imaging  . CXR  Cardiology Data  . Echocardiogram  Scheduled Meds: . chlorhexidine  15 mL Mouth Rinse BID  . Chlorhexidine Gluconate Cloth  6 each Topical Daily  . mouth rinse  15 mL Mouth Rinse q12n4p  . OLANZapine zydis  5 mg Oral Daily  . sodium chloride flush  3 mL Intravenous Q12H   Continuous Infusions:  Active Problems:   Essential hypertension, benign   S/P CABG x 4 with atrial clip   Chronic atrial fibrillation (HCC)   CAD (coronary artery disease)   Acute exacerbation of CHF (congestive heart failure) ([place])   Heart  failure ([place])   LOS: 2 days    A & P  Acute decompensated combined systolic and diastolic heart failure: Pt unable to tolerate diuresis due to BP and tachycardia. CXR demonstrates increasing pulmonary effusion likely related to pneumonia. Pt remains tachycardic and tachypneic. Comfort care only per POA.  Pneumonia likely due to subclinical aspiration: Comfort care only per POA.  CAD with CABG 03/18/2014 and atrial fibrillation with RVR: Rate has been difficult to manage.  Pt BP does not tolerate Cardizem and metoprolol. Digoxin was started yesterday, but it has not been successful at maintaining the patient's heart rate less than 100. Comfort care only per POA.  HTN: Labile. Antihypertensives have been stopped. Comfort care only per POA.  Blood from urinary catheter-Foley placed around last admission 05/2020 secondary to urinary retention. Urine is Iced Tea colored. UA not reported. Comfort care only per POA.  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 Comfort care only per POA.  Prior Covid infection 05/2020: The patient's course appeared to be asymptomatic and incidental. However, following this illness the patient has become steadily more confused and and weak. He is largely bed-bound at this point. He has refused PT/OT attempts to rehabilitate the patient. Comfort care only per POA.  Advanced age with delirium dementia: Complicated by post [id number] encephalopathy. More confused per son, and not improved since admission. The patient has been evaluated by palliative care. Today I have discussed the patient at length with his son [name] who is POA. The decision is to make the patient comfort care only.  I have seen and examined this patient myself. I have spent 48 minutes in his evaluation and care as well as coordination of care.  DVT prophylaxis: Lovenox Code Status: DNR Family Communication: I have discussed the patient at length with the son, [name]. They patient is now a DNR and comfort care only. Hospice has been consulted. Disposition:   Status is: Inpatient  Remains inpatient appropriate because: Unsafe d/c plan and IV treatments appropriate due to intensity of illness or inability to take PO.   Dispo: The patient is from: SNF  Anticipated d/c is to: Residential Hospice  Anticipated d/c date is: 1-2 days  Patient currently is not  medically stable to d/c due to lack of safe discharge.   Estelene Carmack, DO [facility] Direct contact: see [email or web address]  7PM-7AM contact night coverage as above 07/27/2020, 5:47 PM  LOS: 2 days

## 2020-07-28 DIAGNOSIS — I5023 Acute on chronic systolic (congestive) heart failure: Secondary | ICD-10-CM | POA: Diagnosis not present

## 2020-07-28 DIAGNOSIS — Z951 Presence of aortocoronary bypass graft: Secondary | ICD-10-CM

## 2020-07-28 DIAGNOSIS — I10 Essential (primary) hypertension: Secondary | ICD-10-CM | POA: Diagnosis not present

## 2020-07-28 DIAGNOSIS — I2510 Atherosclerotic heart disease of native coronary artery without angina pectoris: Secondary | ICD-10-CM | POA: Diagnosis not present

## 2020-07-28 DIAGNOSIS — I482 Chronic atrial fibrillation, unspecified: Secondary | ICD-10-CM | POA: Diagnosis not present

## 2020-07-28 LAB — SARS CORONAVIRUS 2 BY RT PCR (HOSPITAL ORDER, PERFORMED IN ~~LOC~~ HOSPITAL LAB): SARS Coronavirus 2: NEGATIVE

## 2020-07-28 NOTE — Social Work (Signed)
CSW spoke with patient's son [name]. He reports he is trying to determine if he wants the patient to return to [facility] with Hospice or [facility].  CSW advised will send referral to  [place], she can assist with answering concerns and questions.  CSW contacted Judeen Hammans- left voice mail  Patient's son states he will contact CSW once he has made a decision.  CSW will continue to follow and assist with discharge planning.  Thurmond Butts, MSW, [place] Clinical Social Worker

## 2020-07-28 NOTE — NC FL2 (Signed)
[place] LEVEL OF CARE SCREENING TOOL     IDENTIFICATION  Patient Name: [name] Birthdate: 01-Nov-1917 Sex: male Admission Date (Current Location): 08/07/2020  [facility] and [place] Number:  Herbalist and Address:  [facility] [place], [place] [address], [place],  [address]      Provider Number: [id number]  Attending Physician Name and Address:  Karie Kirks, DO  Relative Name and Phone Number:  [name] (son) [phone]    Current Level of Care: Hospital Recommended Level of Care: [place] Prior Approval Number:    Date Approved/Denied:   PASRR Number: [id number]  Discharge Plan: SNF [facility])    Current Diagnoses: Patient Active Problem List   Diagnosis Date Noted  . Acute exacerbation of CHF (congestive heart failure) ([place]) 07/25/2020  . Heart failure ([place]) 07/25/2020  . Hypernatremia 06/10/2020  . Pressure injury of skin 05/17/2020  . Sepsis due to Escherichia coli (E. coli) ([place]) 05/16/2020  . Subdural hematoma ([place]) 05/16/2020  . Atrial fibrillation with RVR ([place]) 05/16/2020  . COVID-19 virus infection 05/15/2020  . COVID-19 05/15/2020  . AKI (acute kidney injury) ([place])   . Chest pain 01/10/2019  . Abdominal pain 01/10/2019  . LFT elevation 01/10/2019  . Abnormal CT of the chest 01/10/2019  . Swelling in head/neck   . CAD (coronary artery disease) 11/30/2013  . Chronic atrial fibrillation ([place]) 10/27/2013  . Chronic anticoagulation 10/27/2013  . Cardiomyopathy, ischemic- EF 35-40% echo 10/19/12 10/27/2013  . S/P CABG x 4 with atrial clip 10/21/2013  . Left main coronary artery disease 10/20/2013  . Hx of asbestosis 10/20/2013  . NSTEMI (non-ST elevated myocardial infarction) ([place]) 10/17/2013  . Essential hypertension, benign 08/21/2010  . Atrial fibrillation with rapid ventricular response on adm 10/17/13 08/21/2010    Orientation RESPIRATION BLADDER Height & Weight     Self  O2 (2L  nasal cannula) Incontinent, Indwelling catheter Weight: 129 lb 6.6 oz (58.7 kg) Height:     BEHAVIORAL SYMPTOMS/MOOD NEUROLOGICAL BOWEL NUTRITION STATUS      Incontinent Diet (See DC summary)  AMBULATORY STATUS COMMUNICATION OF NEEDS Skin   Extensive Assist Verbally Normal                       Personal Care Assistance Level of Assistance  Bathing, Dressing, Feeding Bathing Assistance: Maximum assistance Feeding assistance: Limited assistance Dressing Assistance: Maximum assistance     Functional Limitations Info             SPECIAL CARE FACTORS FREQUENCY  OT (By licensed OT), PT (By licensed PT)     PT Frequency: 5X per week OT Frequency: 5X per week            Contractures      Additional Factors Info  Allergies, Code Status, Psychotropic Code Status Info: DNR Allergies Info: Norvasc (amlodipine Besylate), Symmetrel (amantadine), Hydrochlorothiazide Psychotropic Info: Zyprexa, Ativan         Current Medications (07/28/2020):  This is the current hospital active medication list Current Facility-Administered Medications  Medication Dose Route Frequency Provider Last Rate Last Admin  . acetaminophen (TYLENOL) tablet 650 mg  650 mg Oral Q6H PRN Swayze, Ava, DO       Or  . acetaminophen (TYLENOL) suppository 650 mg  650 mg Rectal Q6H PRN Swayze, Ava, DO      . antiseptic oral rinse (BIOTENE) solution 15 mL  15 mL Topical PRN Swayze, Ava, DO      .  bisacodyl (DULCOLAX) suppository 10 mg  10 mg Rectal Daily PRN Swayze, Ava, DO      . chlorhexidine (PERIDEX) 0.12 % solution 15 mL  15 mL Mouth Rinse BID Nita Sells, MD   15 mL at 07/28/20 0959  . Chlorhexidine Gluconate Cloth 2 % PADS 6 each  6 each Topical Daily Nita Sells, MD   6 each at 07/28/20 1000  . diphenhydrAMINE (BENADRYL) injection 12.5 mg  12.5 mg Intravenous Q4H PRN Swayze, Ava, DO      . glycopyrrolate (ROBINUL) tablet 1 mg  1 mg Oral Q4H PRN Swayze, Ava, DO       Or  .  glycopyrrolate (ROBINUL) injection 0.2 mg  0.2 mg Subcutaneous Q4H PRN Swayze, Ava, DO       Or  . glycopyrrolate (ROBINUL) injection 0.2 mg  0.2 mg Intravenous Q4H PRN Swayze, Ava, DO      . loperamide (IMODIUM) capsule 2 mg  2 mg Oral Q2H PRN Swayze, Ava, DO      . LORazepam (ATIVAN) tablet 1 mg  1 mg Oral Q2H PRN Swayze, Ava, DO       Or  . LORazepam (ATIVAN) 2 MG/ML concentrated solution 1 mg  1 mg Sublingual Q2H PRN Swayze, Ava, DO       Or  . LORazepam (ATIVAN) injection 1 mg  1 mg Intravenous Q2H PRN Swayze, Ava, DO      . MEDLINE mouth rinse  15 mL Mouth Rinse q12n4p Samtani, Jai-Gurmukh, MD      . morphine 2 MG/ML injection 1-2 mg  1-2 mg Intravenous Q1H PRN Swayze, Ava, DO   2 mg at 07/28/20 1004  . OLANZapine zydis (ZYPREXA) disintegrating tablet 5 mg  5 mg Oral Daily Swayze, Ava, DO   5 mg at 07/28/20 0958  . ondansetron (ZOFRAN-ODT) disintegrating tablet 4 mg  4 mg Oral Q6H PRN Swayze, Ava, DO       Or  . ondansetron (ZOFRAN) injection 4 mg  4 mg Intravenous Q6H PRN Swayze, Ava, DO      . polyvinyl alcohol (LIQUIFILM TEARS) 1.4 % ophthalmic solution 1 drop  1 drop Both Eyes QID PRN Swayze, Ava, DO      . sodium chloride flush (NS) 0.9 % injection 3 mL  3 mL Intravenous Q12H Marcelyn Bruins, MD   3 mL at 07/28/20 1005  . white petrolatum (VASELINE) gel   Topical PRN Nita Sells, MD         Discharge Medications: Please see discharge summary for a list of discharge medications.  Relevant Imaging Results:  Relevant Lab Results:   Additional Information SSN: [id number] Hospice to follow SNF  Glennon Hamilton, Student-Social Work

## 2020-07-28 NOTE — Progress Notes (Signed)
Manufacturing engineer [facility]) Hospital Liaison Note  Received referral for hospice services upon discharge back to [facility] SNF on 07/28/20.  Spoke with son [name] at bedside to confirm, offer support and explain services. Gave patient family [facility] contact information and encouraged to call with additional questions or concerns.  Plan is to discharge home once medically stable as soon as tomorrow per patient son. [facility] Nurse will follow up with patient and family once back at [facility]   Please send completed DNR and arrange for any comfort scripts that may be needed for symptom management so there is no lapse in patient comfort prior to hospice services beginning.   Gar Ponto, RN [facility] Liaison (in [place]) [phone]

## 2020-07-28 NOTE — Progress Notes (Signed)
PROGRESS NOTE  [name] UXN:[id number] DOB: 1918-08-11 DOA: 08/06/2020 PCP: Lajean Manes, MD  Brief History   The patient is a 86 yr old man with progressive weakness particularly in the weeks following his diagnosis with COVID-19. Although his symptoms were very minor this has been accompanied by significant cognitive decline as described by the patient's son as well as progressive weakness. He was also found to have acute SDH on that 05/2020 admission. On 08/05/2020 the patient was admitted from [facility] with volume overload, increased confusion, and foley catheter malfunction. The patient has remained hypotensive and has not been able to tolerate significant diuresis.   The patient remains very weak and confused. Meaningful return to his prior level of functioning is unlikely. He has been largely bed bound since that hospitalization in August. They do not wish for escalation of care, and do not wish for him to return to the hospital. [name] understands that we are unable to keep fluid off of the patient and that this will complicate his respiratory status. He has not responded to IV antibiotics or attempts to diurese him. He remains tachycardic, tachypneic, and confused. He is very weak and has previously refused attempts at rehabilitation through PT.  I have discussed this at length with his son, [name] who has decided to make the patient a DNR and to transfer him over from acute care to comfort care. Hospice has been consulted. Consultants   Palliative care  Hospice  Procedures   None  Antibiotics   Anti-infectives (From admission, onward)   None     Subjective  The patient is lying in bed. He is minimally responsive to voice.  Objective   Vitals:  Vitals:   07/27/20 2040 07/28/20 1155  BP: 137/82 128/65  Pulse:  (!) 112  Resp: 20 20  Temp: (!) 97.5 F (36.4 C) (!) 97.4 F (36.3 C)  SpO2: 98% 96%   Exam:  Constitutional:   The patient is moribund. He is  elderly, weak, frail, and cachectic. No acute distress. Respiratory:   No increased work of breathing.  No wheezes, rales, or rhonchi  No tactile fremitus  Altered pattern of respirations. Cardiovascular:   Regular rate and rhythm  No murmurs, ectopy, or gallups.  No lateral PMI. No thrills. Abdomen:   Abdomen is soft, non-tender, non-distended  No hernias, masses, or organomegaly  Normoactive bowel sounds.  Musculoskeletal:   No cyanosis or clubbing.  2-3+ pitting edema of lower extremities bilaterally. Skin:   No rashes, lesions, ulcers  palpation of skin: no induration or nodules Neurologic:   CN 2-12 intact  Sensation all 4 extremities intact Psychiatric:   Mental status o Mood, affect appropriate o Orientation to person, place, time   judgment and insight appear intact  I have personally reviewed the following:   [place]   CXR  Cardiology Data   Echocardiogram  Scheduled Meds:  chlorhexidine  15 mL Mouth Rinse BID   Chlorhexidine Gluconate Cloth  6 each Topical Daily   mouth rinse  15 mL Mouth Rinse q12n4p   OLANZapine zydis  5 mg Oral Daily   sodium chloride flush  3 mL Intravenous Q12H   Continuous Infusions:  Active Problems:   Essential hypertension, benign   S/P CABG x 4 with atrial clip   Chronic atrial fibrillation (HCC)   CAD (coronary artery disease)   Acute exacerbation of CHF (congestive heart failure) ([place])   Heart failure ([place])   LOS:  3 days    A & P  Acute decompensated combined systolic and diastolic heart failure: Pt unable to tolerate diuresis due to BP and tachycardia. CXR demonstrates increasing pulmonary effusion likely related to pneumonia. Pt remains tachycardic and tachypneic. Comfort care only per POA.  Pneumonia likely due to subclinical aspiration: Comfort care only per POA.  CAD with CABG 03/18/2014 and atrial fibrillation with RVR: Rate has been difficult to manage. Pt BP  does not tolerate Cardizem and metoprolol. Digoxin was started yesterday, but it has not been successful at maintaining the patient's heart rate less than 100. Comfort care only per POA.  HTN: Labile. Antihypertensives have been stopped. Comfort care only per POA.  Blood from urinary catheter-Foley placed around last admission 05/2020 secondary to urinary retention. Urine is Iced Tea colored. UA not reported. Comfort care only per POA.  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 Comfort care only per POA.  Prior Covid infection 05/2020: The patient's course appeared to be asymptomatic and incidental. However, following this illness the patient has become steadily more confused and and weak. He is largely bed-bound at this point. He has refused PT/OT attempts to rehabilitate the patient. Comfort care only per POA.  Advanced age with delirium dementia: Complicated by post [id number] encephalopathy. More confused per son, and not improved since admission. The patient has been evaluated by palliative care. Today I have discussed the patient at length with his son [name] who is POA. The decision is to make the patient comfort care only.  I have seen and examined this patient myself. I have spent 48 minutes in his evaluation and care as well as coordination of care.  DVT prophylaxis: Lovenox Code Status: DNR Family Communication: None today. Disposition: Home with hospice  Status is: Inpatient  Remains inpatient appropriate because: Unsafe d/c plan and IV treatments appropriate due to intensity of illness or inability to take PO.   Dispo: The patient is from: SNF  Anticipated d/c is to: [facility]idential [facility]ospice  Anticipated d/c date is: 1 days  Patient currently is not medically stable to d/c due to lack of safe discharge.   Brent Michetti, DO [facility] Direct contact: see  [email or web address]  7PM-7AM contact night coverage as above 07/28/2020, 6:08 PM  LOS: 2 days

## 2020-07-28 NOTE — TOC Progression Note (Signed)
Transition of Care [facility]) - Progression Note    Patient Details  Name: DIAR BERKEL MRN: [id number] Date of Birth: 13-Jan-1918  Transition of Care [facility]) CM/SW [place], [place] Phone Number: 07/28/2020, 2:41 PM  Clinical Narrative:     CSW received call form [facility](kelly) and [facility](Sherry) family has decided patient will return back to [facility] with Hospice care.    Hospice and SNF aware of anticipated discharge tomorrow. RN updated- covid test requested  CSW will continue to follow and assist with discharge planning.   Thurmond Butts, MSW, [place] Clinical Social Worker    Expected Discharge Plan: [place]    Expected Discharge Plan and Services Expected Discharge Plan: [place] In-house Referral: Clinical Social Work                                             Social Determinants of Health (SDOH) Interventions    Readmission Risk Interventions No flowsheet data found.

## 2020-07-28 NOTE — Social Work (Signed)
CSW received consult for residential hospice. CSW contacted patient's son, [name], he requested to give CSW a call back- contact information was provided.   Thurmond Butts, MSW, [place] Clinical Social Worker

## 2020-07-29 DIAGNOSIS — I482 Chronic atrial fibrillation, unspecified: Secondary | ICD-10-CM | POA: Diagnosis not present

## 2020-07-29 DIAGNOSIS — Z951 Presence of aortocoronary bypass graft: Secondary | ICD-10-CM | POA: Diagnosis not present

## 2020-07-29 DIAGNOSIS — J189 Pneumonia, unspecified organism: Secondary | ICD-10-CM | POA: Diagnosis not present

## 2020-07-29 DIAGNOSIS — I5043 Acute on chronic combined systolic (congestive) and diastolic (congestive) heart failure: Secondary | ICD-10-CM | POA: Diagnosis not present

## 2020-07-29 DIAGNOSIS — Z515 Encounter for palliative care: Secondary | ICD-10-CM

## 2020-07-29 DIAGNOSIS — Z7189 Other specified counseling: Secondary | ICD-10-CM

## 2020-07-29 DIAGNOSIS — I10 Essential (primary) hypertension: Secondary | ICD-10-CM | POA: Diagnosis not present

## 2020-07-29 DIAGNOSIS — I2510 Atherosclerotic heart disease of native coronary artery without angina pectoris: Secondary | ICD-10-CM | POA: Diagnosis not present

## 2020-07-29 MED ORDER — WHITE PETROLATUM EX OINT: 1.0000 "application " | TOPICAL_OINTMENT | CUTANEOUS | 0 refills | Status: AC | PRN

## 2020-07-29 MED ORDER — ACETAMINOPHEN 650 MG RE SUPP: 650.0000 mg | Freq: Four times a day (QID) | RECTAL | 0 refills | Status: AC | PRN

## 2020-07-29 MED ORDER — CHLORHEXIDINE GLUCONATE 0.12 % MT SOLN: 15.0000 mL | Freq: Two times a day (BID) | OROMUCOSAL | 0 refills | Status: AC

## 2020-07-29 MED ORDER — OLANZAPINE 5 MG PO TBDP: 5.0000 mg | ORAL_TABLET | Freq: Every day | ORAL | 0 refills | Status: AC

## 2020-07-29 MED ORDER — BIOTENE DRY MOUTH MT LIQD: 15.0000 mL | OROMUCOSAL | 0 refills | Status: AC | PRN

## 2020-07-29 MED ORDER — GLYCOPYRROLATE 0.2 MG/ML IJ SOLN: 0.2000 mg | INTRAMUSCULAR | 0 refills | Status: AC | PRN

## 2020-07-29 MED ORDER — ONDANSETRON 4 MG PO TBDP: 4.0000 mg | ORAL_TABLET | Freq: Four times a day (QID) | ORAL | 0 refills | Status: AC | PRN

## 2020-07-29 MED ORDER — MORPHINE SULFATE (CONCENTRATE) 20 MG/ML PO SOLN
5.0000 mg | ORAL | 0 refills | Status: AC | PRN
Start: 2020-07-29 — End: ?

## 2020-07-29 MED ORDER — POLYVINYL ALCOHOL 1.4 % OP SOLN: 1.0000 [drp] | Freq: Four times a day (QID) | OPHTHALMIC | 0 refills | Status: AC | PRN

## 2020-07-29 MED ORDER — LORAZEPAM 2 MG/ML PO CONC: 1.0000 mg | ORAL | 0 refills | Status: AC | PRN

## 2020-07-29 MED ORDER — ORAL CARE MOUTH RINSE: 15.0000 mL | Freq: Two times a day (BID) | OROMUCOSAL | 0 refills | Status: AC

## 2020-07-29 NOTE — Progress Notes (Signed)
Received secure epic chat from RN, Lauren requesting follow-up with family regarding plan of care since patient is minimally responsive this morning. Chart reviewed. Visited with patient and family at bedside including son. Patient is lethargic with regular/shallow respirations. He appears comfortable without s/s of distress. Blood pressure is soft but he does appear stable for short transfer back to [facility]. After further discussions, family confirms their wish for discharge back to [facility] with support of hospice services. [facility] following. He will return to the long-term care section where he will receive 24/7 care along with hospice support. Expressed to family that if it is important to discharge him home, this should occur sooner than later as he does seem to be transitioning compared to RN's assessment yesterday. [facility] can accept him back today.   Updated Dr. Benny Lennert, RN, and SW. Dr. Benny Lennert will prepare him for discharge to [facility] with hospice.   Diagnoses:  Acute decompensated combined CHF Acute respiratory failure Pneumonia, likely aspiration CAD with CABG 03/18/2014 HTN Encephalopathy Prior COVID 05/2020 Advanced age with delirium/dementia  Exam:  Unresponsive but appears comfortable without s/s of distress Regular, shallow respirations Tachycardia  Time: 20 minutes  Ihor Dow, [place], FNP-C Palliative Medicine Team  Phone: [phone] Fax: [phone]

## 2020-07-29 NOTE — Progress Notes (Signed)
Pt's son, [name], verbalized frustration to staff about inconsistency with the plan for the pt. He stated that [facility] had contacted him to let him know that the pt was no longer appropriate to discharge to the facility. Family requested another BP be taken. BP 92/55. Family requesting to hear from the MD on a plan.

## 2020-07-29 NOTE — Care Management Important Message (Signed)
Important Message  Patient Details  Name: [name] MRN: [id number] Date of Birth: 1918-01-24   Medicare Important Message Given:  Yes     Shelda Altes 07/29/2020, 10:51 AM

## 2020-07-29 NOTE — Progress Notes (Signed)
PROGRESS NOTE  [name] KNL:[id number] DOB: 1918/10/05 DOA: 07/25/2020 PCP: Lajean Manes, MD  Brief History   The patient is a 86 yr old man with progressive weakness particularly in the weeks following his diagnosis with COVID-19. Although his symptoms were very minor this has been accompanied by significant cognitive decline as described by the patient's son as well as progressive weakness. He was also found to have acute SDH on that 05/2020 admission. On 07/29/2020 the patient was admitted from [facility] with volume overload, increased confusion, and foley catheter malfunction. The patient has remained hypotensive and has not been able to tolerate significant diuresis.   The patient remains very weak and confused. Meaningful return to his prior level of functioning is unlikely. He has been largely bed bound since that hospitalization in August. They do not wish for escalation of care, and do not wish for him to return to the hospital. [name] understands that we are unable to keep fluid off of the patient and that this will complicate his respiratory status. He has not responded to IV antibiotics or attempts to diurese him. He remains tachycardic, tachypneic, and confused. He is very weak and has previously refused attempts at rehabilitation through PT.  I have discussed this at length with his son, [name] who has decided to make the patient a DNR and to transfer him over from acute care to comfort care. Hospice has been consulted. Consultants   Palliative care  Hospice  Procedures   None  Antibiotics   Anti-infectives (From admission, onward)   None     Subjective  The patient is lying in bed. He is minimally responsive to voice. Respirations are shallow and rapid.  Objective   Vitals:  Vitals:   07/29/20 1207 07/29/20 1237  BP:  (!) 92/55  Pulse:    Resp: (!) 36 (!) 40  Temp:    SpO2:  96%   Exam:  Constitutional:   The patient is moribund. He is elderly, weak,  frail, and cachectic. No acute distress. Respiratory:   Respirations are rapid and shallow.  No wheezes, rales, or rhonchi  No tactile fremitus  Altered pattern of respirations. Cardiovascular:   Regular rate and rhythm  No murmurs, ectopy, or gallups.  No lateral PMI. No thrills. Abdomen:   Abdomen is soft, non-tender, non-distended  No hernias, masses, or organomegaly  Normoactive bowel sounds.  Musculoskeletal:   No cyanosis or clubbing.  2-3+ pitting edema of lower extremities bilaterally. Skin:   No rashes, lesions, ulcers  palpation of skin: no induration or nodules Neurologic:   CN 2-12 intact  Sensation all 4 extremities intact Psychiatric:   Mental status o Mood, affect appropriate o Orientation to person, place, time   judgment and insight appear intact  I have personally reviewed the following:   [place]   CXR  Cardiology Data   Echocardiogram  Scheduled Meds:  chlorhexidine  15 mL Mouth Rinse BID   Chlorhexidine Gluconate Cloth  6 each Topical Daily   mouth rinse  15 mL Mouth Rinse q12n4p   OLANZapine zydis  5 mg Oral Daily   sodium chloride flush  3 mL Intravenous Q12H   Continuous Infusions:  Active Problems:   Essential hypertension, benign   S/P CABG x 4 with atrial clip   Chronic atrial fibrillation (HCC)   CAD (coronary artery disease)   Acute exacerbation of CHF (congestive heart failure) ([place])   Heart failure (HCC)   Pneumonia due  to infectious organism   Palliative care by specialist   Goals of care, counseling/discussion   LOS: 4 days    A & P  Acute decompensated combined systolic and diastolic heart failure: Pt unable to tolerate diuresis due to BP and tachycardia. CXR demonstrates increasing pulmonary effusion likely related to pneumonia. Pt remains tachycardic and tachypneic. The patient's blood pressure has been as low as 67/47, however it was rechecked moments later and found  to be 92/55. Although it was initially felt that the drop in blood pressure may have meant that the patient's demise was imminent, and transfer to [facility] was cancelled, the rebound in BP to 92/55 changed that opinion. This has caused the family to feel that the information is inconsistent and they now have reported to nursing that they wish for the patient to pass as inpatient.  I accede to their wishes whatever they may be. Comfort care only per POA.  Pneumonia likely due to subclinical aspiration: Comfort care only per POA.  CAD with CABG 03/18/2014 and atrial fibrillation with RVR: Rate has been difficult to manage. Pt BP does not tolerate Cardizem and metoprolol. Digoxin was started yesterday, but it has not been successful at maintaining the patient's heart rate less than 100. Comfort care only per POA.  HTN: Labile. Antihypertensives have been stopped. Comfort care only per POA.  Blood from urinary catheter-Foley placed around last admission 05/2020 secondary to urinary retention. Urine is Iced Tea colored. UA not reported. Comfort care only per POA.  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 Comfort care only per POA.  Prior Covid infection 05/2020: The patient's course appeared to be asymptomatic and incidental. However, following this illness the patient has become steadily more confused and and weak. He is largely bed-bound at this point. He has refused PT/OT attempts to rehabilitate the patient. Comfort care only per POA.  Advanced age with delirium dementia: Complicated by post [id number]-38 encephalopathy. More confused per son, and not improved since admission. The patient has been evaluated by palliative care. Today I have discussed the patient at length with his son [name] who is POA. The decision is to make the patient comfort care only.  I have seen and examined this patient myself. I have spent 48  minutes in his evaluation and care as well as coordination of care.  DVT prophylaxis: Lovenox Code Status: DNR Family Communication: None today. Disposition: [facility] with hospice  Status is: Inpatient  Remains inpatient appropriate because: Unsafe d/c plan and IV treatments appropriate due to intensity of illness or inability to take PO.   Dispo: The patient is from: SNF  Anticipated d/c is to: Residential Hospice  Anticipated d/c date is: 1 days  Patient currently is not medically stable to d/c due to lack of safe discharge.   Aquan Kope, DO [facility] Direct contact: see [email or web address]  7PM-7AM contact night coverage as above 07/29/2020, 4:44 PM  LOS: 2 days

## 2020-07-29 NOTE — Progress Notes (Addendum)
Manufacturing engineer (ACC)  Noted PMT note about stability of pt to d/c to [facility].  ACC will follow up with family and facility to begin hospice services.  Please arrange for any comfort medications that may be needed prior to hospice arriving so there is no lapse in his managed symptoms.  Venia Carbon RN, BSN, [place] [facility] Liaison   **reviewed notes from the course of the day, spoke with son [name].  [name] feels like things do not seemed to be clear/stable for his father to d/c back to [facility] today.  He states he is getting conflicting information from different sources and states he was told his father will stay through the night.  I believe [name] is waiting for an update from the attending about this change in plans.

## 2020-07-29 NOTE — Progress Notes (Signed)
Spoke with palliative care about pt's status regarding transport to [facility] with Hospice.Jinny Blossom, NP came to bedside to assess pt. Advised that pt should be transported sooner rather than later, however to keep an eye on pt's BP. MD to finish discharge paperwork and sign DNR form for transport.

## 2020-07-29 NOTE — Discharge Summary (Addendum)
Physician Discharge Summary  [name] DOB: 1918/09/30 DOA: August 21, 2020  PCP: Brent Manes, MD  Admit date: Aug 21, 2020 Date and Time of Death: Aug 27, 2020, 0621  Discharge Diagnoses: Principal diagnosis is #1 1. Acute decompensated combined systolic and diastolic heaert railure 2. Pneumonia due to subclinical aspiration 3. CAD, s/p CABG 4. Atrial fibrillation with RVR 5. Hypertension 6. Urinary retention and hematuria 7. 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 Advanced age with dementia and delirium  Discharge Condition: Deceased  Filed Weights   08/23/2020 0500 07/27/20 0413  Weight: 60.6 kg 58.7 kg    History of present illness:  [name] is a 86 y.o. male with medical history significant of hypertension, atrial fibrillation, CAD (status post CABG x4), CHF with reduced ejection fraction (last echo in the system in 02/01/14 and unable to be viewed) who presented from his facility via EMS after staff was having difficulty with his Foley and after deflating the balloon a clot came out and they were unable to reinflate Foley.  Patient also with some confusion.  He can have some waxing and waning confusion at baseline however he is typically alert and oriented x3 and he was alert and oriented x1 only in ED.  Additionally he was noted to have volume overload in the ED. Patient is unable to significantly participate in his history due to confusion, above history obtained via chart review.  He was alert and oriented x3 for me.  ED Course: Vital signs stable in the ED.  Lab work-up remarkable only for chronic anemia with hemoglobin 12.5 and likely protein malnutrition with albumin 2.8.  UA shows grossly bloody sample.  Chest x-ray showed new bilateral pleural effusions.   Hospital Course:  The patient is a 86 yr old man with progressive weakness particularly in the weeks following his diagnosis with COVID-19. Although his symptoms were  very minor this has been accompanied by significant cognitive decline as described by the patient's son as well as progressive weakness. He was also found to have acute SDH on that 05/2020 admission. On Aug 21, 2020 the patient was admitted from [facility] with volume overload, increased confusion, and foley catheter malfunction. The patient has remained hypotensive and has not been able to tolerate significant diuresis.   The patient remains very weak and confused. Meaningful return to his prior level of functioning is unlikely. He has been largely bed bound since that hospitalization in August. They do not wish for escalation of care, and do not wish for him to return to the hospital. [name] understands that we are unable to keep fluid off of the patient and that this will complicate his respiratory status. He has not responded to IV antibiotics or attempts to diurese him. He remains tachycardic, tachypneic, and confused. He is very weak and has previously refused attempts at rehabilitation through PT.  I have discussed this at length with his son, [name] who has decided to make the patient a DNR and to transfer him over from acute care to comfort care. Hospice has been consulted. The patient was found to be without spontaneous cardiac activity or respirations. He was pronounced dead at 0621 am on 2020-08-27.  Last physical exam prior to death was performed on 08/26/2020 S: The patient is moribund. He has rapid shallow respirations. O: Vitals:  Vitals:   2020-08-26 1207 08-26-2020 1237  BP:  (!) 92/55  Pulse:    Resp: (!) 36 (!) 40  Temp:    SpO2:  96%   Exam:  Constitutional:   The patient is moribund. He is elderly, weak, frail, and cachectic. No acute distress. Respiratory:   No increased work of breathing.  No wheezes, rales, or rhonchi  No tactile fremitus  Altered pattern of respirations. Cardiovascular:   Regular rate and rhythm  No murmurs, ectopy, or gallups.  No lateral PMI.  No thrills. Abdomen:   Abdomen is soft, non-tender, non-distended  No hernias, masses, or organomegaly  Normoactive bowel sounds.  Musculoskeletal:   No cyanosis or clubbing.  2-3+ pitting edema of lower extremities bilaterally. Skin:   No rashes, lesions, ulcers  palpation of skin: no induration or nodules Neurologic:   CN 2-12 intact  Sensation all 4 extremities intact Psychiatric:   Mental status ? Mood, affect appropriate ? Orientation to person, place, time   judgment and insight appear intact   The results of significant diagnostics from this hospitalization (including imaging, microbiology, ancillary and laboratory) are listed below for reference.    Significant Diagnostic Studies: DG Chest 1 View  Result Date: 08/10/2020 CLINICAL DATA:  Fluid overload. COVID-19 viral pneumonia. Coronary artery disease. EXAM: CHEST  1 VIEW COMPARISON:  05/15/2020 FINDINGS: Heart size is stable. Prior CABG again noted. Aortic atherosclerotic calcification noted. Moderate bilateral pleural effusions and bibasilar atelectasis are new since previous study. Extensive calcified pleural plaque again seen bilaterally. IMPRESSION: New moderate bilateral pleural effusions and bibasilar atelectasis. Extensive bilateral calcified pleural plaques, consistent with asbestos related pleural disease. Electronically Signed   By: Marlaine Hind M.D.   On: 08/06/2020 20:54   DG Chest 2 View  Result Date: 07/26/2020 CLINICAL DATA:  Pneumonia EXAM: CHEST - 2 VIEW COMPARISON:  07/25/2020 FINDINGS: Similar bilateral pleural effusions, right greater than left with associated atelectasis. Pulmonary vascular congestion. Extensive calcified pleural plaques reflecting asbestos related disease. Similar cardiomediastinal contours. IMPRESSION: No significant change with persistent right greater than left pleural effusions and associated atelectasis. Pulmonary vascular congestion. Electronically Signed   By: Macy Mis M.D.   On: 07/26/2020 08:10   DG Chest Port 1 View  Result Date: 07/25/2020 CLINICAL DATA:  Hypoxia, pleural effusion EXAM: PORTABLE CHEST 1 VIEW COMPARISON:  08/05/2020 at 8:35 p.m. FINDINGS: Lung volumes are small, however, pulmonary insufflation remain stable since prior examination. Extensive bilateral calcified pleural plaques are again identified with associated pleural thickening compatible with this best is related pleural disease. Small left and moderate to large right pleural effusions are present, unchanged. Coronary artery bypass grafting and left atrial clipping has been performed. Cardiac size is mildly enlarged, unchanged. Pulmonary vascularity is normal. No acute bone abnormality. IMPRESSION: Stable examination. Bilateral pleural effusions, moderate to large on the right and small on the left. Extensive asbestos related pleural disease again noted. Electronically Signed   By: Fidela Salisbury MD   On: 07/25/2020 00:19   ECHOCARDIOGRAM COMPLETE  Result Date: 07/25/2020    ECHOCARDIOGRAM REPORT   Patient Name:   ROMIE TAY Date of Exam: 07/25/2020 Medical Rec #:  [id number]      Height:       66.0 in Accession #:    [id number]     Weight:       113.0 lb Date of Birth:  [birth year]     BSA:          1.569 m Patient Age:    36 years       BP:           93/67 mmHg Patient Gender: M  HR:           119 bpm. Exam Location:  Inpatient Procedure: 2D Echo, Cardiac Doppler and Color Doppler Indications:    I50.23 Acute on chronic systolic (congestive) heart failure  History:        Patient has prior history of Echocardiogram examinations, most                 recent 04/21/2014. Prior CABG, Arrythmias:Atrial Fibrillation;                 Risk Factors:Hypertension. COVID-19.  Sonographer:    Jonelle Sidle Dance Referring Phys: [id number] [place]  1. Left ventricular ejection fraction, by estimation, is 50 to 55%. The left ventricle has low normal function. The left  ventricle has no regional wall motion abnormalities. Left ventricular diastolic function could not be evaluated.  2. Right ventricular systolic function is mildly reduced. The right ventricular size is normal. There is mildly elevated pulmonary artery systolic pressure.  3. Left atrial size was moderately dilated.  4. Right atrial size was moderately dilated.  5. The mitral valve is abnormal. Mild mitral valve regurgitation. Moderate mitral annular calcification.  6. Tricuspid valve regurgitation is moderate.  7. The aortic valve is grossly normal. There is mild calcification of the aortic valve. There is mild thickening of the aortic valve. Aortic valve regurgitation is trivial. Mild aortic valve sclerosis is present, with no evidence of aortic valve stenosis.  8. The inferior vena cava is normal in size with <50% respiratory variability, suggesting right atrial pressure of 8 mmHg. Comparison(s): No prior Echocardiogram. FINDINGS  Left Ventricle: Left ventricular ejection fraction, by estimation, is 50 to 55%. The left ventricle has low normal function. The left ventricle has no regional wall motion abnormalities. The left ventricular internal cavity size was normal in size. There is borderline left ventricular hypertrophy. Abnormal (paradoxical) septal motion consistent with post-operative status. Left ventricular diastolic function could not be evaluated due to atrial fibrillation. Left ventricular diastolic function could  not be evaluated. Right Ventricle: The right ventricular size is normal. No increase in right ventricular wall thickness. Right ventricular systolic function is mildly reduced. There is mildly elevated pulmonary artery systolic pressure. The tricuspid regurgitant velocity  is 2.88 m/s, and with an assumed right atrial pressure of 8 mmHg, the estimated right ventricular systolic pressure is 74.2 mmHg. Left Atrium: Left atrial size was moderately dilated. Right Atrium: Right atrial size was  moderately dilated. Pericardium: There is no evidence of pericardial effusion. Mitral Valve: The mitral valve is abnormal. There is moderate thickening of the mitral valve leaflet(s). There is moderate calcification of the mitral valve leaflet(s). Moderate mitral annular calcification. Mild mitral valve regurgitation. Tricuspid Valve: The tricuspid valve is normal in structure. Tricuspid valve regurgitation is moderate . No evidence of tricuspid stenosis. Aortic Valve: The aortic valve is grossly normal. There is mild calcification of the aortic valve. There is mild thickening of the aortic valve. Aortic valve regurgitation is trivial. Mild aortic valve sclerosis is present, with no evidence of aortic valve stenosis. Aortic valve mean gradient measures 3.0 mmHg. Aortic valve peak gradient measures 6.0 mmHg. Pulmonic Valve: The pulmonic valve was not well visualized. Pulmonic valve regurgitation is not visualized. Aorta: The aortic root and ascending aorta are structurally normal, with no evidence of dilitation. Venous: The inferior vena cava is normal in size with less than 50% respiratory variability, suggesting right atrial pressure of 8 mmHg. IAS/Shunts: The atrial septum is grossly normal.  LEFT VENTRICLE PLAX 2D LVIDd:         4.00 cm LVIDs:         2.95 cm LV PW:         1.24 cm LV IVS:        1.13 cm  RIGHT VENTRICLE          IVC RV Basal diam:  3.25 cm  IVC diam: 2.01 cm RV Mid diam:    2.23 cm TAPSE (M-mode): 1.1 cm LEFT ATRIUM             Index       RIGHT ATRIUM           Index LA diam:        5.20 cm 3.31 cm/m  RA Area:     21.50 cm LA Vol (A2C):   60.8 ml 38.75 ml/m RA Volume:   55.90 ml  35.63 ml/m LA Vol (A4C):   61.9 ml 39.45 ml/m LA Biplane Vol: 60.8 ml 38.75 ml/m  AORTIC VALVE AV Vmax:           122.00 cm/s AV Vmean:          81.900 cm/s AV VTI:            0.201 m AV Peak Grad:      6.0 mmHg AV Mean Grad:      3.0 mmHg LVOT Vmax:         53.55 cm/s LVOT Vmean:        32.300 cm/s LVOT VTI:           0.086 m LVOT/AV VTI ratio: 0.43  AORTA Ao Asc diam: 3.10 cm MITRAL VALVE               TRICUSPID VALVE MV Area (PHT): 4.89 cm    TR Peak grad:   33.2 mmHg MV Decel Time: 155 msec    TR Vmax:        288.00 cm/s MV E velocity: 91.95 cm/s                            SHUNTS                            Systemic VTI: 0.09 m Buford Dresser MD Electronically signed by Buford Dresser MD Signature Date/Time: 07/25/2020/3:01:13 PM    Final     Microbiology: Recent Results (from the past 240 hour(s))  Respiratory Panel by RT PCR (Flu A&B, Covid) - Nasopharyngeal Swab     Status: None   Collection Time: 07/29/2020 11:50 PM   Specimen: Nasopharyngeal Swab  Result Value Ref Range Status   SARS Coronavirus 2 by RT PCR NEGATIVE NEGATIVE Final    Comment: (NOTE) SARS-CoV-2 target nucleic acids are NOT DETECTED.  The SARS-CoV-2 RNA is generally detectable in upper respiratoy specimens during the acute phase of infection. The lowest concentration of SARS-CoV-2 viral copies this assay can detect is 131 copies/mL. A negative result does not preclude SARS-Cov-2 infection and should not be used as the sole basis for treatment or other patient management decisions. A negative result may occur with  improper specimen collection/handling, submission of specimen other than nasopharyngeal swab, presence of viral mutation(s) within the areas targeted by this assay, and inadequate number of viral copies (<131 copies/mL). A negative result must be combined with clinical observations, patient history, and epidemiological information. The expected result  is Negative.  Fact Sheet for Patients:  PinkCheek.be  Fact Sheet for Healthcare Providers:  GravelBags.it  This test is no t yet approved or cleared by the [place] FDA and  has been authorized for detection and/or diagnosis of SARS-CoV-2 by FDA under an Emergency Use Authorization (EUA).  This EUA will remain  in effect (meaning this test can be used) for the duration of the COVID-19 declaration under Section 564(b)(1) of the Act, 21 U.S.C. section 360bbb-3(b)(1), unless the authorization is terminated or revoked sooner.     Influenza A by PCR NEGATIVE NEGATIVE Final   Influenza B by PCR NEGATIVE NEGATIVE Final    Comment: (NOTE) The Xpert Xpress SARS-CoV-2/FLU/RSV assay is intended as an aid in  the diagnosis of influenza from Nasopharyngeal swab specimens and  should not be used as a sole basis for treatment. Nasal washings and  aspirates are unacceptable for Xpert Xpress SARS-CoV-2/FLU/RSV  testing.  Fact Sheet for Patients: PinkCheek.be  Fact Sheet for Healthcare Providers: GravelBags.it  This test is not yet approved or cleared by the [place] FDA and  has been authorized for detection and/or diagnosis of SARS-CoV-2 by  FDA under an Emergency Use Authorization (EUA). This EUA will remain  in effect (meaning this test can be used) for the duration of the  Covid-19 declaration under Section 564(b)(1) of the Act, 21  U.S.C. section 360bbb-3(b)(1), unless the authorization is  terminated or revoked. Performed at [facility], [place] [address], [place], [place] [address]   SARS Coronavirus 2 by RT PCR (hospital order, performed in [facility] hospital lab) Nasopharyngeal Nasopharyngeal Swab     Status: None   Collection Time: 07/28/20  3:49 PM   Specimen: Nasopharyngeal Swab  Result Value Ref Range Status   SARS Coronavirus 2 NEGATIVE NEGATIVE Final    Comment: (NOTE) SARS-CoV-2 target nucleic acids are NOT DETECTED.  The SARS-CoV-2 RNA is generally detectable in upper and lower respiratory specimens during the acute phase of infection. The lowest concentration of SARS-CoV-2 viral copies this assay can detect is 250 copies / mL. A negative result does not preclude SARS-CoV-2 infection and  should not be used as the sole basis for treatment or other patient management decisions.  A negative result may occur with improper specimen collection / handling, submission of specimen other than nasopharyngeal swab, presence of viral mutation(s) within the areas targeted by this assay, and inadequate number of viral copies (<250 copies / mL). A negative result must be combined with clinical observations, patient history, and epidemiological information.  Fact Sheet for Patients:   StrictlyIdeas.no  Fact Sheet for Healthcare Providers: BankingDealers.co.za  This test is not yet approved or  cleared by the [place] FDA and has been authorized for detection and/or diagnosis of SARS-CoV-2 by FDA under an Emergency Use Authorization (EUA).  This EUA will remain in effect (meaning this test can be used) for the duration of the COVID-19 declaration under Section 564(b)(1) of the Act, 21 U.S.C. section 360bbb-3(b)(1), unless the authorization is terminated or revoked sooner.  Performed at [facility], [place] [address] [place], [place] [address]      Labs: Basic Metabolic Panel: Recent Labs  Lab 08/08/2020 2056 07/25/20 0210 07/26/20 0139 07/27/20 0302  NA 136 136 138 139  K 3.9 4.6 3.1* 2.9*  CL 94* 94* 93* 92*  CO2 30 34* 33* 37*  GLUCOSE 135* 136* 109* 119*  BUN 12 12 8 10   CREATININE 0.77 0.76 0.81  0.84  CALCIUM 9.2 8.9 8.6* 8.5*  MG  --  2.0  --   --    Liver Function Tests: Recent Labs  Lab 08/03/2020 2056 07/26/20 0139 07/27/20 0302  AST 17 12* 11*  ALT 9 7 9   ALKPHOS 75 56 54  BILITOT 1.1 2.3* 2.4*  PROT 6.8 5.5* 5.3*  ALBUMIN 2.8* 2.1* 1.9*   No results for input(s): LIPASE, AMYLASE in the last 168 hours. No results for input(s): AMMONIA in the last 168 hours. CBC: Recent Labs  Lab 07/31/2020 2056 07/25/20 0210 07/26/20 0139 07/27/20 0302  WBC 10.2 9.1 13.1* 10.5  NEUTROABS 8.4*  --  11.4* 8.8*    HGB 12.5* 11.0* 10.8* 10.3*  HCT 41.1 35.6* 34.8* 32.4*  MCV 100.0 100.8* 98.3 97.3  PLT 263 240 219 211   Cardiac Enzymes: No results for input(s): CKTOTAL, CKMB, CKMBINDEX, TROPONINI in the last 168 hours. BNP: BNP (last 3 results) Recent Labs    07/25/20 0210  BNP 763.1*    ProBNP (last 3 results) No results for input(s): PROBNP in the last 8760 hours.  CBG: No results for input(s): GLUCAP in the last 168 hours.  Active Problems:   Essential hypertension, benign   S/P CABG x 4 with atrial clip   Chronic atrial fibrillation (HCC)   CAD (coronary artery disease)   Acute exacerbation of CHF (congestive heart failure) ([place])   Heart failure ([place])   Pneumonia due to infectious organism   Palliative care by specialist   Goals of care, counseling/discussion   Time coordinating discharge: 38 minutes.  Signed:        Pennie Vanblarcom, DO [facility]  2020-08-01, 9:07 AM

## 2020-08-15 NOTE — [place] Notes (Signed)
Pt monitor rang out asystole. Checked on pt. Myself and Adonis Brook verified and pronounced death at [phone]. [place] MD notified. Son [name] notified of pts status. Family will be here to see pt. CDS called. Spoke with [employer], stated pt not a candidate donor. Referral number [id number]

## 2020-08-15 DEATH — deceased

## 2021-02-19 IMAGING — CT CT ABDOMEN AND PELVIS WITH CONTRAST
2 of 4 series · 15 of 46 positions shown, 17 images · IV contrast (Omni 300)
Comparison: Head CT 08/18/2015

CLINICAL DATA: Abdominal pain since earlier today. Elevated liver
function tests.

EXAM:
CT ABDOMEN AND PELVIS WITH CONTRAST
TECHNIQUE: Multidetector CT imaging of the abdomen and pelvis was performed
using the standard protocol following bolus administration of
intravenous contrast.
CONTRAST:  100mL OMNIPAQUE IOHEXOL 300 MG/ML  SOLN

[Series 8: cap with 5mm st · axial · 0.90mm/px · z∈[+865,+1275]mm · 12 of 94 slices shown, 14 images]
[im 6/94  soft-tissue]
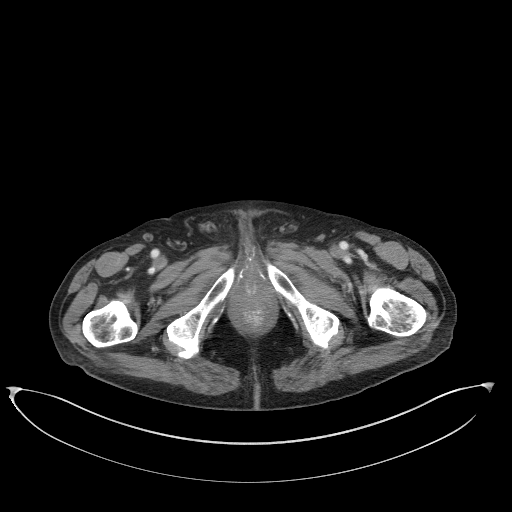
[im 6/94  bone]
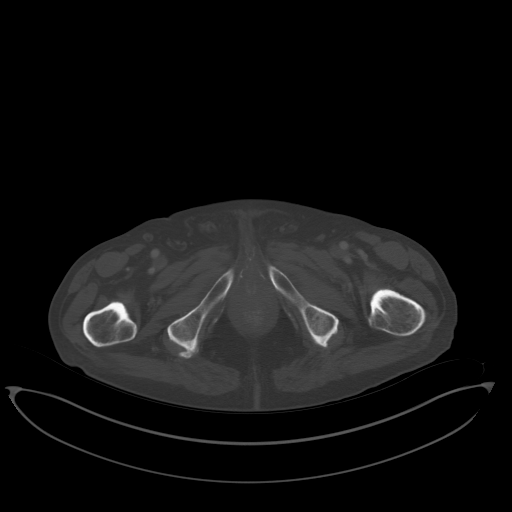
[im 16/94  soft-tissue]
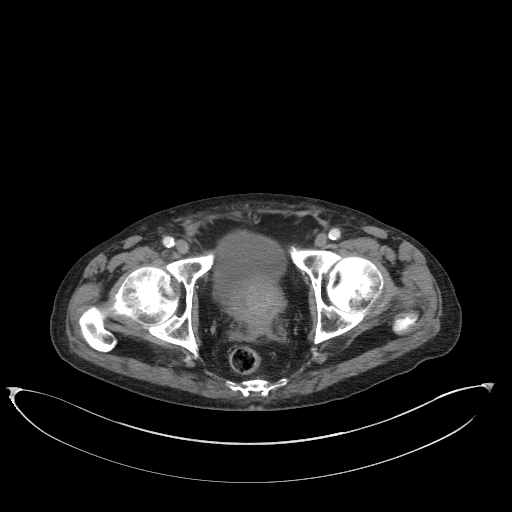
[im 21/94  soft-tissue]
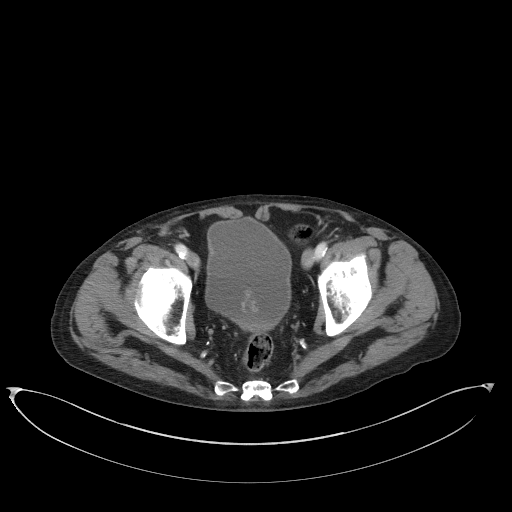
[im 26/94  soft-tissue]
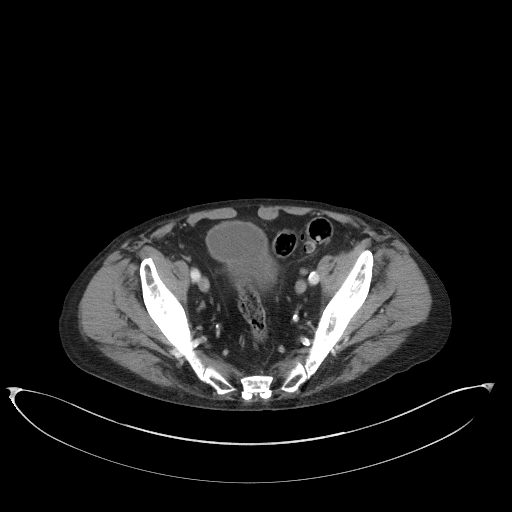
[im 37/94  soft-tissue]
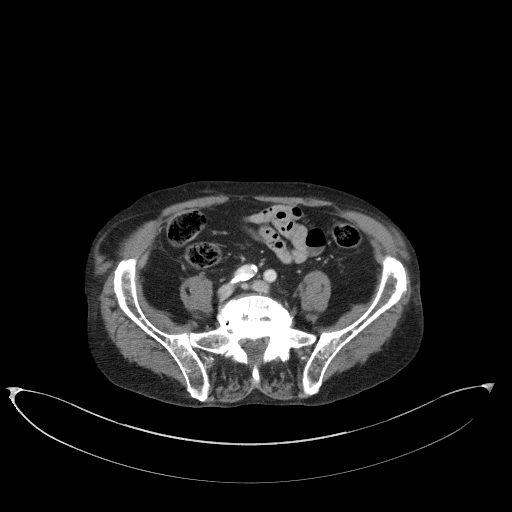
[im 42/94  soft-tissue]
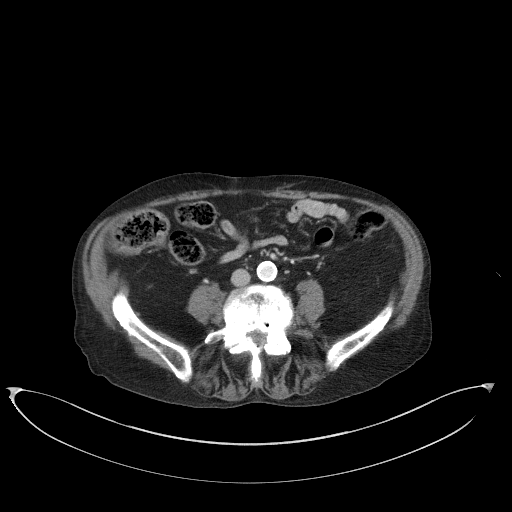
[im 52/94  soft-tissue]
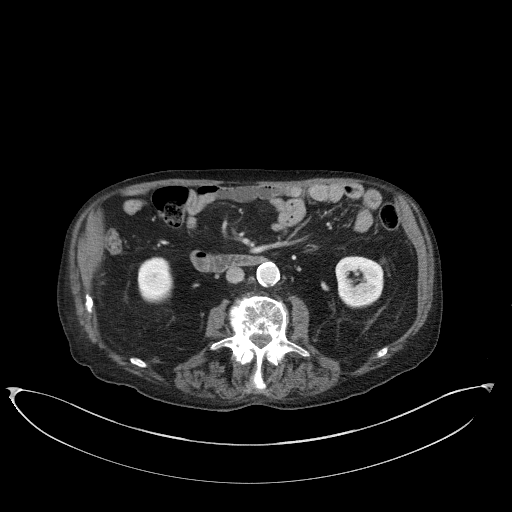
[im 57/94  soft-tissue]
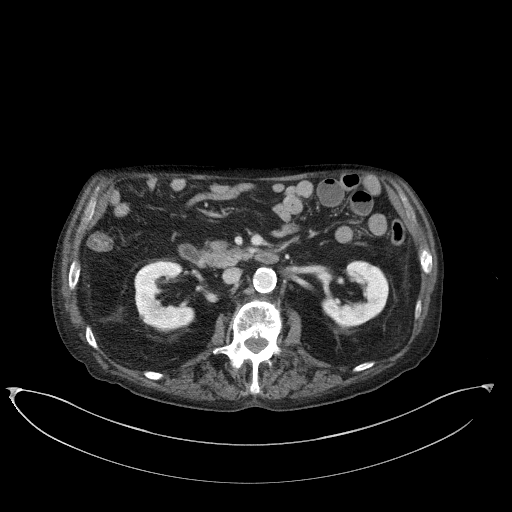
[im 68/94  soft-tissue]
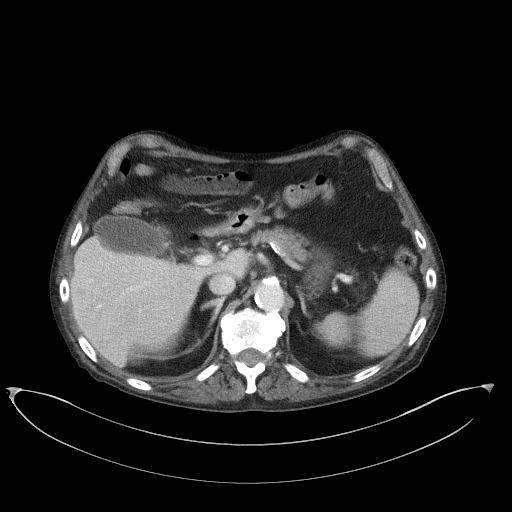
[im 68/94  bone]
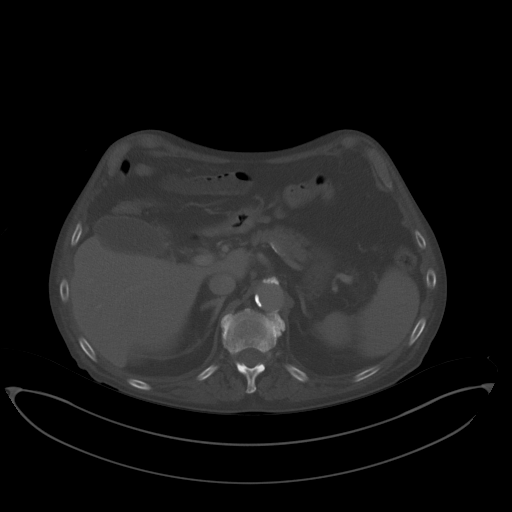
[im 73/94  soft-tissue]
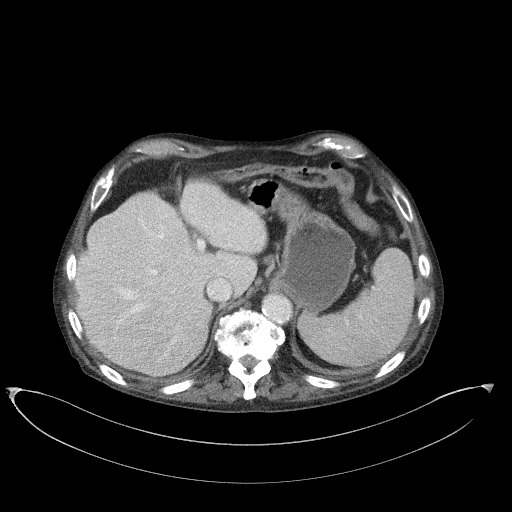
[im 78/94  soft-tissue]
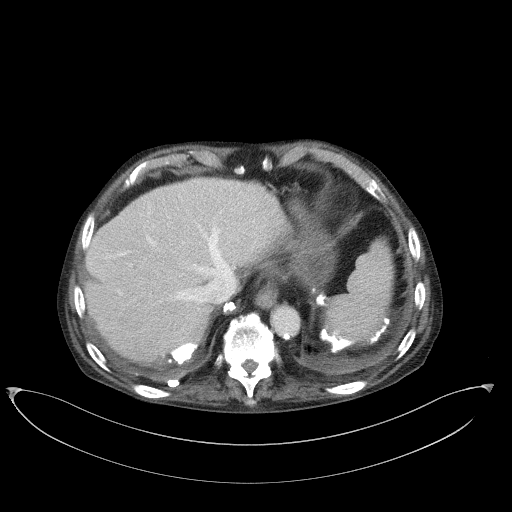
[im 88/94  soft-tissue]
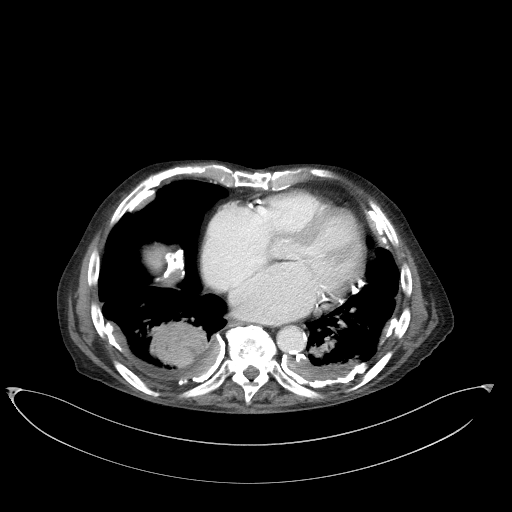

[Series 10: cap with 3mm st cor · coronal · 0.74mm/px · 3 of 147 slices shown]
[im 49/147  soft-tissue]
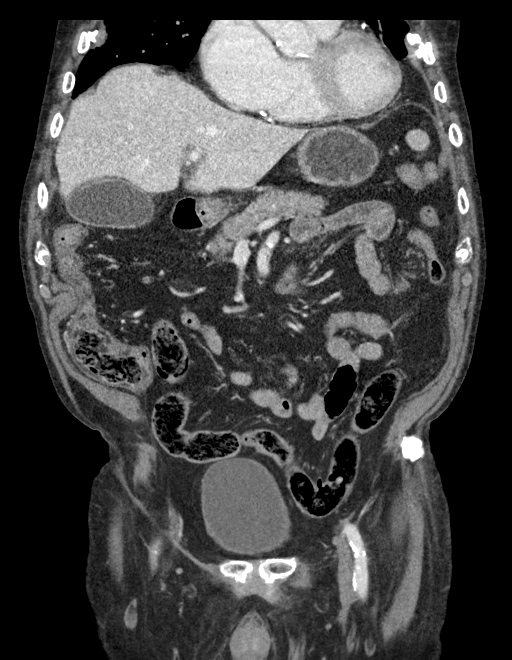
[im 65/147  soft-tissue]
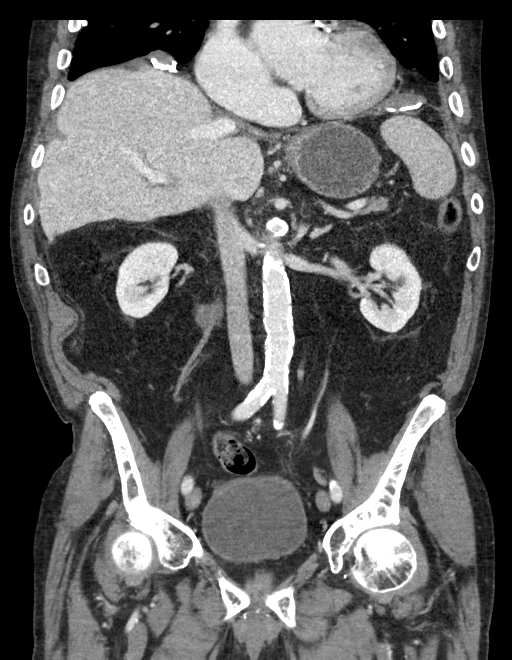
[im 82/147  soft-tissue]
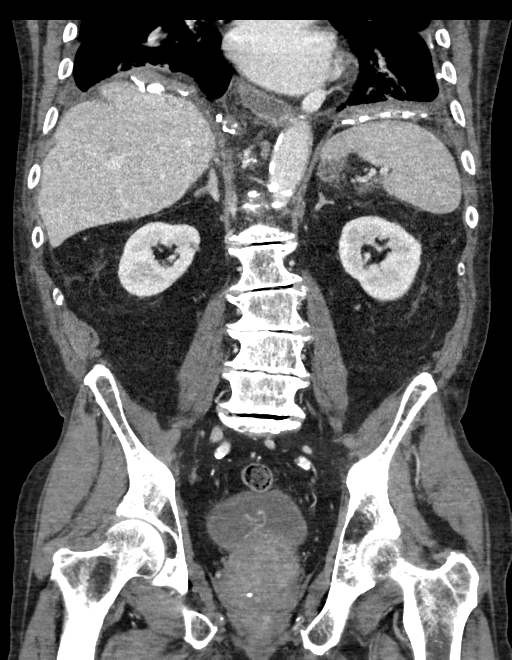

[15 of 46 positions shown; findings below may reference images not displayed]

FINDINGS: Lower chest: Cardiomegaly with coronary arteriosclerosis and aortic
atherosclerosis. The patient is status post CABG. Calcified pleural
plaque along the periphery of both lungs are identified with new
rounded masslike opacities at each lung base compatible with rounded
atelectasis given what appears to be prior asbestos exposure. The
largest opacity at the right lung base measures at least 5.8 x 4.4 x
3.8 cm. Slightly more oblong opacity at the left lung base is noted
measuring at least 5.4 x 3.5 x 4.1 cm. A lingular opacity which is
partially included is again noted and based on prior imaging is
contiguous with the pleural thickening.

Hepatobiliary: 3 mm hypodensities in the right hepatic lobe too
small to further characterize are identified statistically
consistent with cysts. The gallbladder is physiologically distended.
There may be some minimal internal debris without definite calculus
or secondary signs of acute cholecystitis.

Pancreas: No inflammation, ductal dilatation or mass.

Spleen: Normal

Adrenals/Urinary Tract: Normal bilateral adrenal glands.
Subcentimeter hypodensities within both kidneys statistically
consistent with cysts. No nephrolithiasis nor obstructive uropathy.
No hydroureteronephrosis. The urinary bladder is unremarkable for
the degree of distention.

Stomach/Bowel: Small hiatal hernia. Physiologic distention of the
stomach. The duodenal sweep and ligament of Treitz are normal. No
small bowel obstruction or inflammation. Moderate stool retention
within the colon. The appendix is normal.

Vascular/Lymphatic: Marked aortoiliac and branch vessel
atherosclerosis without aneurysm, dissection or significant
stenosis. No lymphadenopathy.

Reproductive: Enlarged prostate measuring up to 5 cm transverse
impressing upon the base of the bladder.

Other: No abdominal wall hernia or abnormality. No abdominopelvic
ascites.

Musculoskeletal: Thoracolumbar spondylosis with marked disc space
narrowing T8 through S1.
IMPRESSION: 1. Calcified pleural plaque are redemonstrated. Rounded masslike
opacities at each lung base are most consistent with rounded
atelectasis which can be seen in the setting of prior asbestos
exposure. Follow-up dedicated chest CT may help assess for disease
elsewhere within the chest and to assure stability.
2. Tiny too small to characterize hypodensities within the liver and
both kidneys statistically consistent with cysts.
3. Prostatomegaly.
4. Thoracolumbar spondylosis.
5. No acute bowel obstruction or inflammation.
6. Probable small volume of biliary sludge within the gallbladder
without secondary signs of acute cholecystitis.

## 2021-02-20 IMAGING — US ULTRASOUND ABDOMEN LIMITED
1 series · 14 of 25 positions shown · non-contrast
Comparison: Abdominal CT yesterday.

CLINICAL DATA: Elevated LFTs.

EXAM:
ULTRASOUND ABDOMEN LIMITED RIGHT UPPER QUADRANT

[Series 1: ultrasound abdomen limited · 14 of 42 slices shown]
[im 1/42]
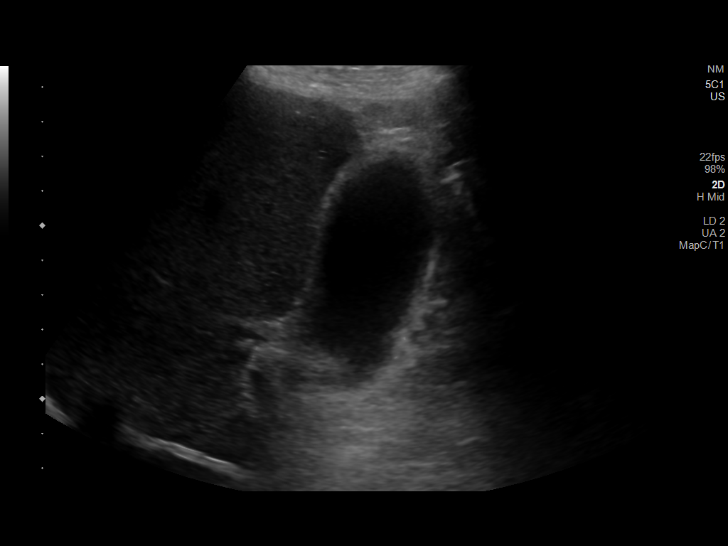
[im 4/42]
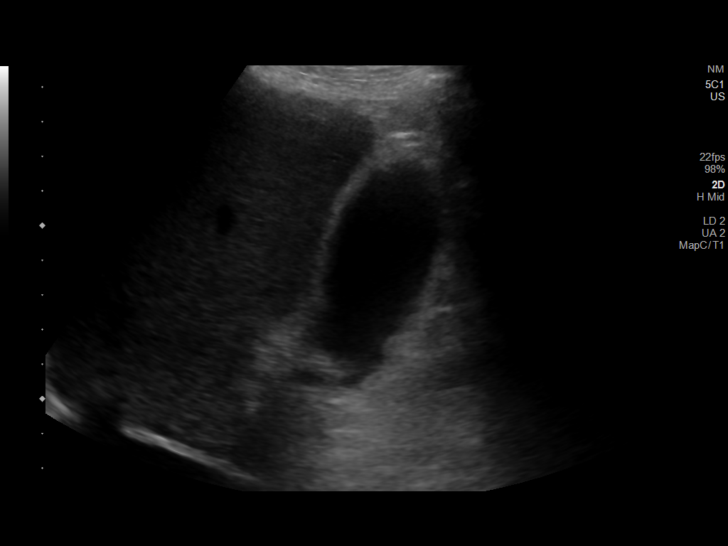
[im 7/42]
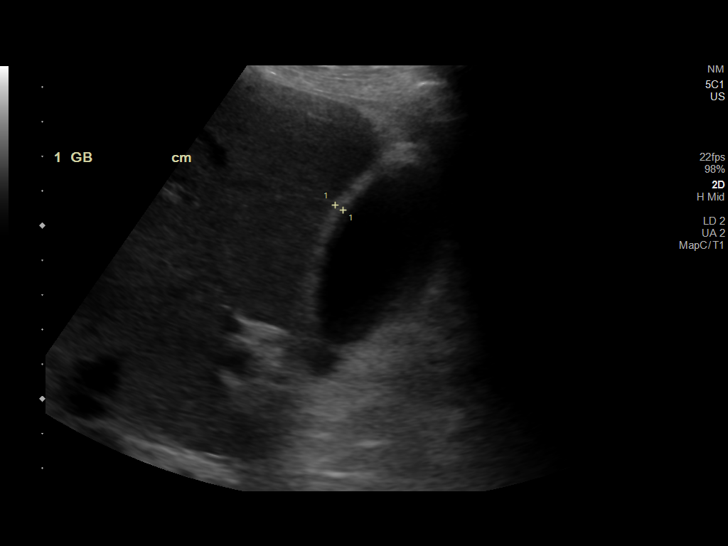
[im 11/42]
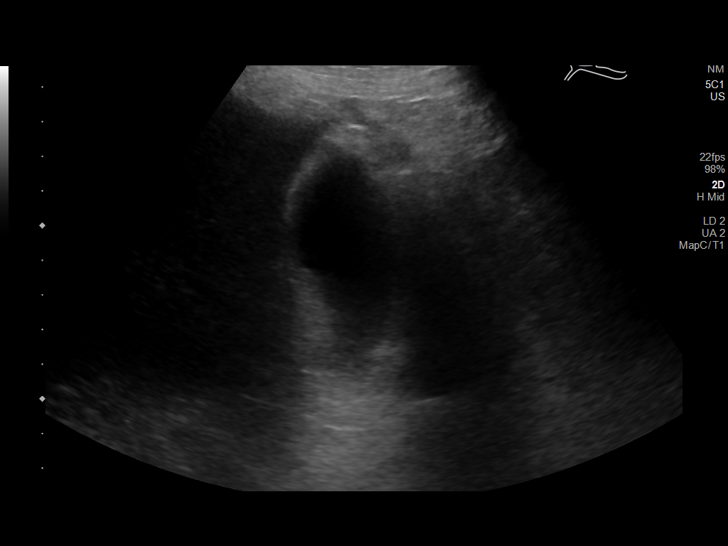
[im 14/42]
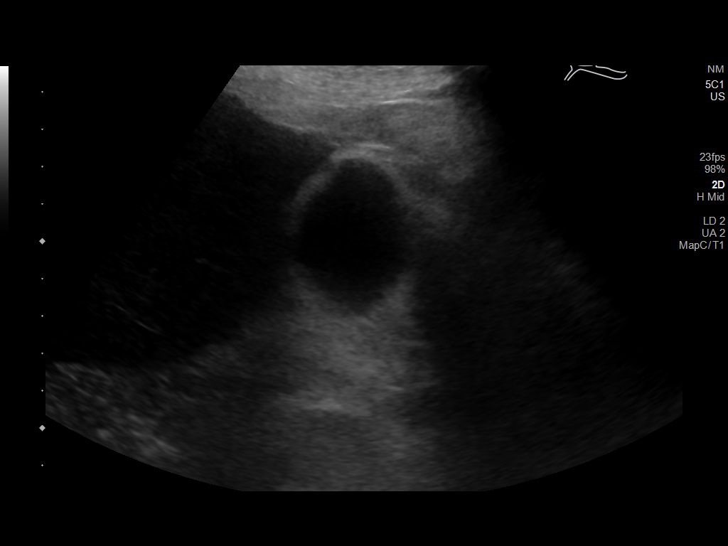
[im 16/42]
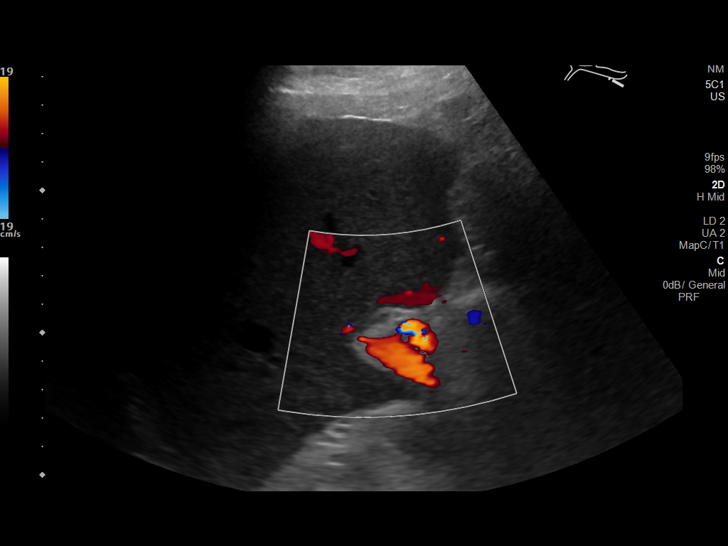
[im 19/42]
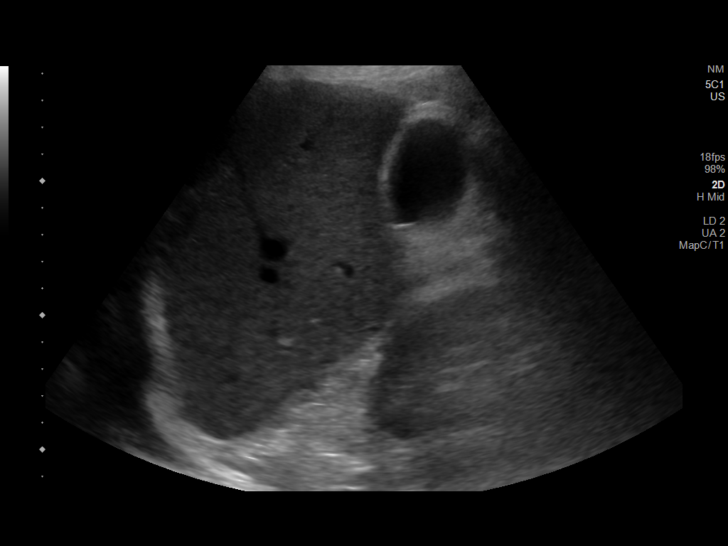
[im 23/42]
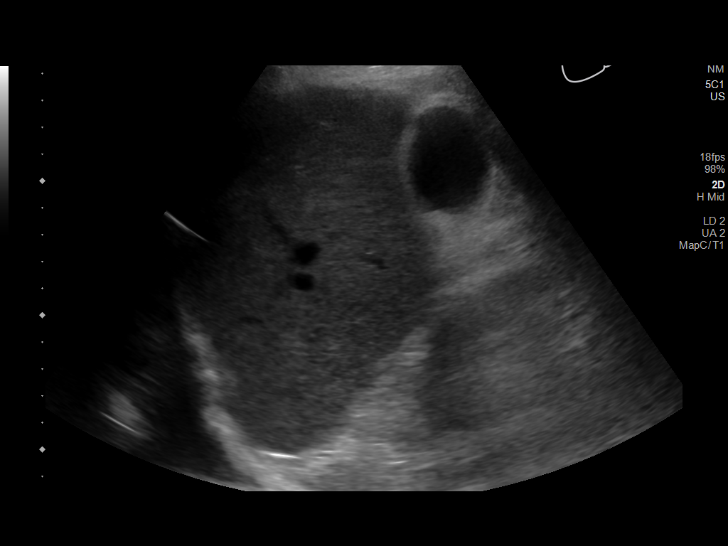
[im 26/42]
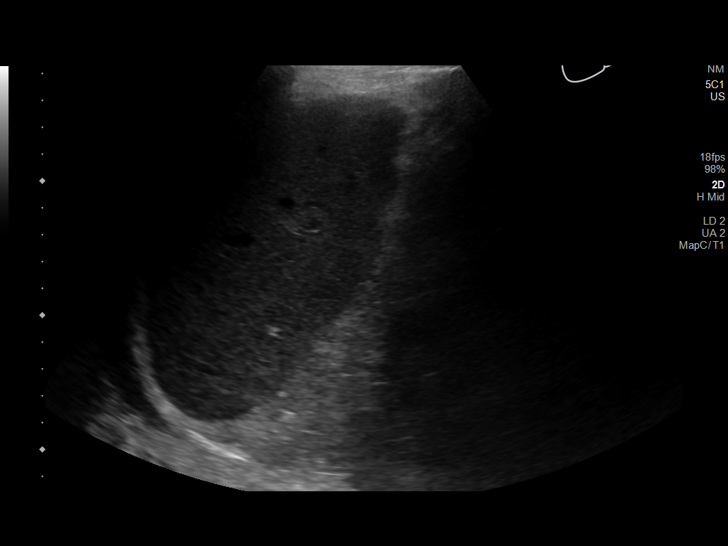
[im 28/42]
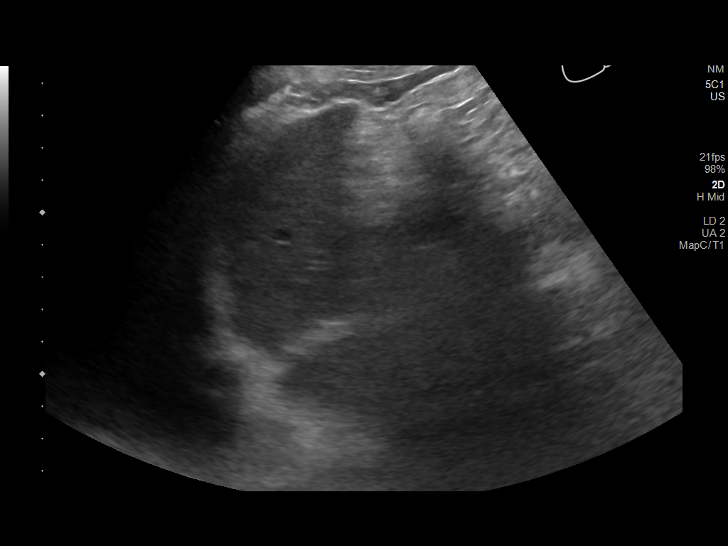
[im 31/42]
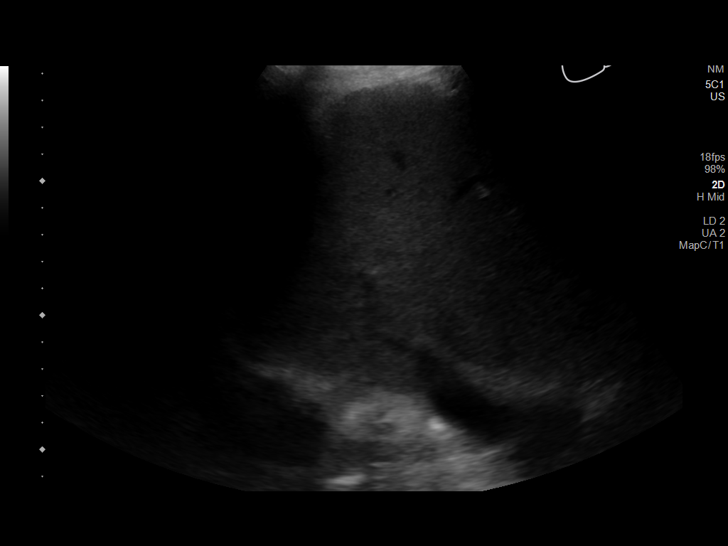
[im 35/42]
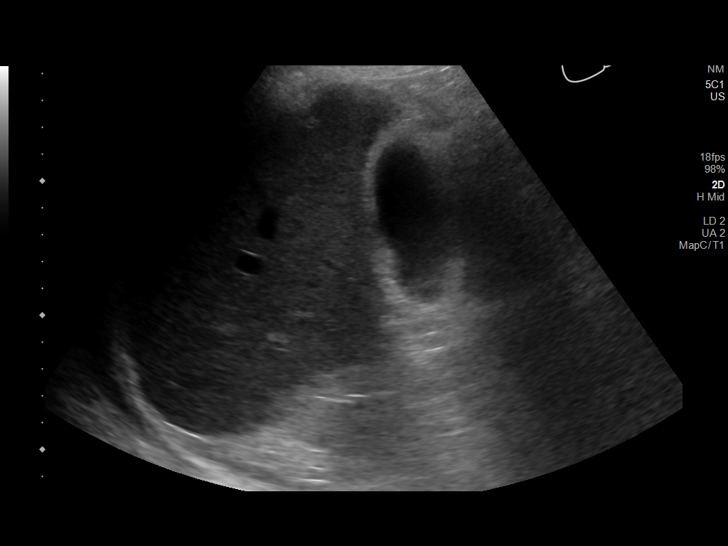
[im 38/42]
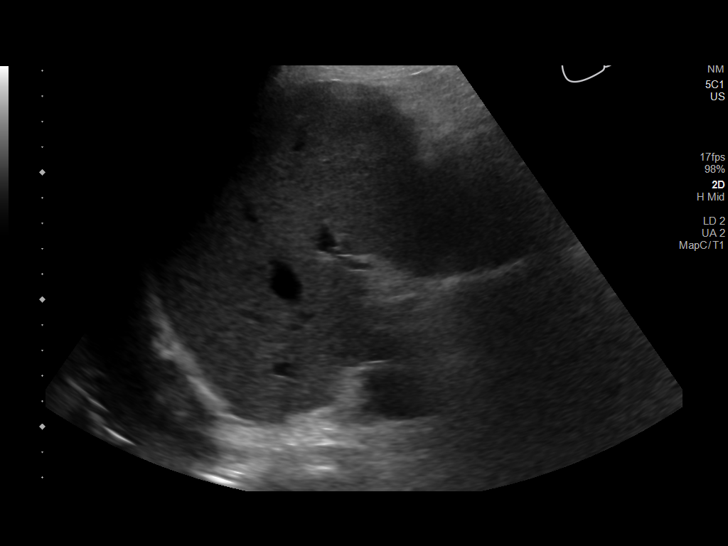
[im 42/42]
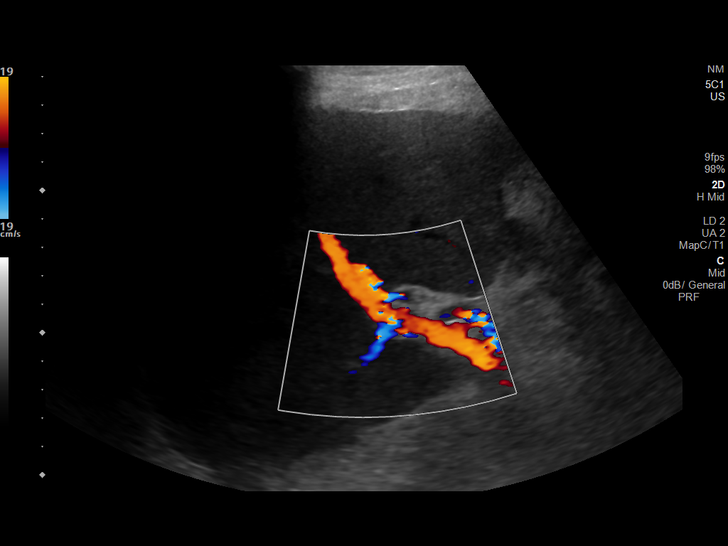

[14 of 25 positions shown; findings below may reference images not displayed]

FINDINGS: Gallbladder:

Physiologically distended. No wall thickening. Layering echogenic
debris in the gallbladder likely combination of small stones and
sludge. No sonographic Murphy sign noted by sonographer.

Common bile duct:

Diameter: 4 mm.

Liver:

No focal lesion identified. Within normal limits in parenchymal
echogenicity. Portal vein is patent on color Doppler imaging with
normal direction of blood flow towards the liver.

Incidental right pleural effusion.
IMPRESSION: 1. Layering stones or sludge in the gallbladder. No secondary
findings of cholecystitis.
2. No explanation for elevated LFTs.

## 2022-06-26 IMAGING — DX DG CHEST 1V PORT
1 series · 1 of 1 positions shown · non-contrast
Comparison: Radiograph 01/09/2019, CT 08/08/2015

CLINICAL DATA: [id number] positive

EXAM:
PORTABLE CHEST 1 VIEW

[chest ap]
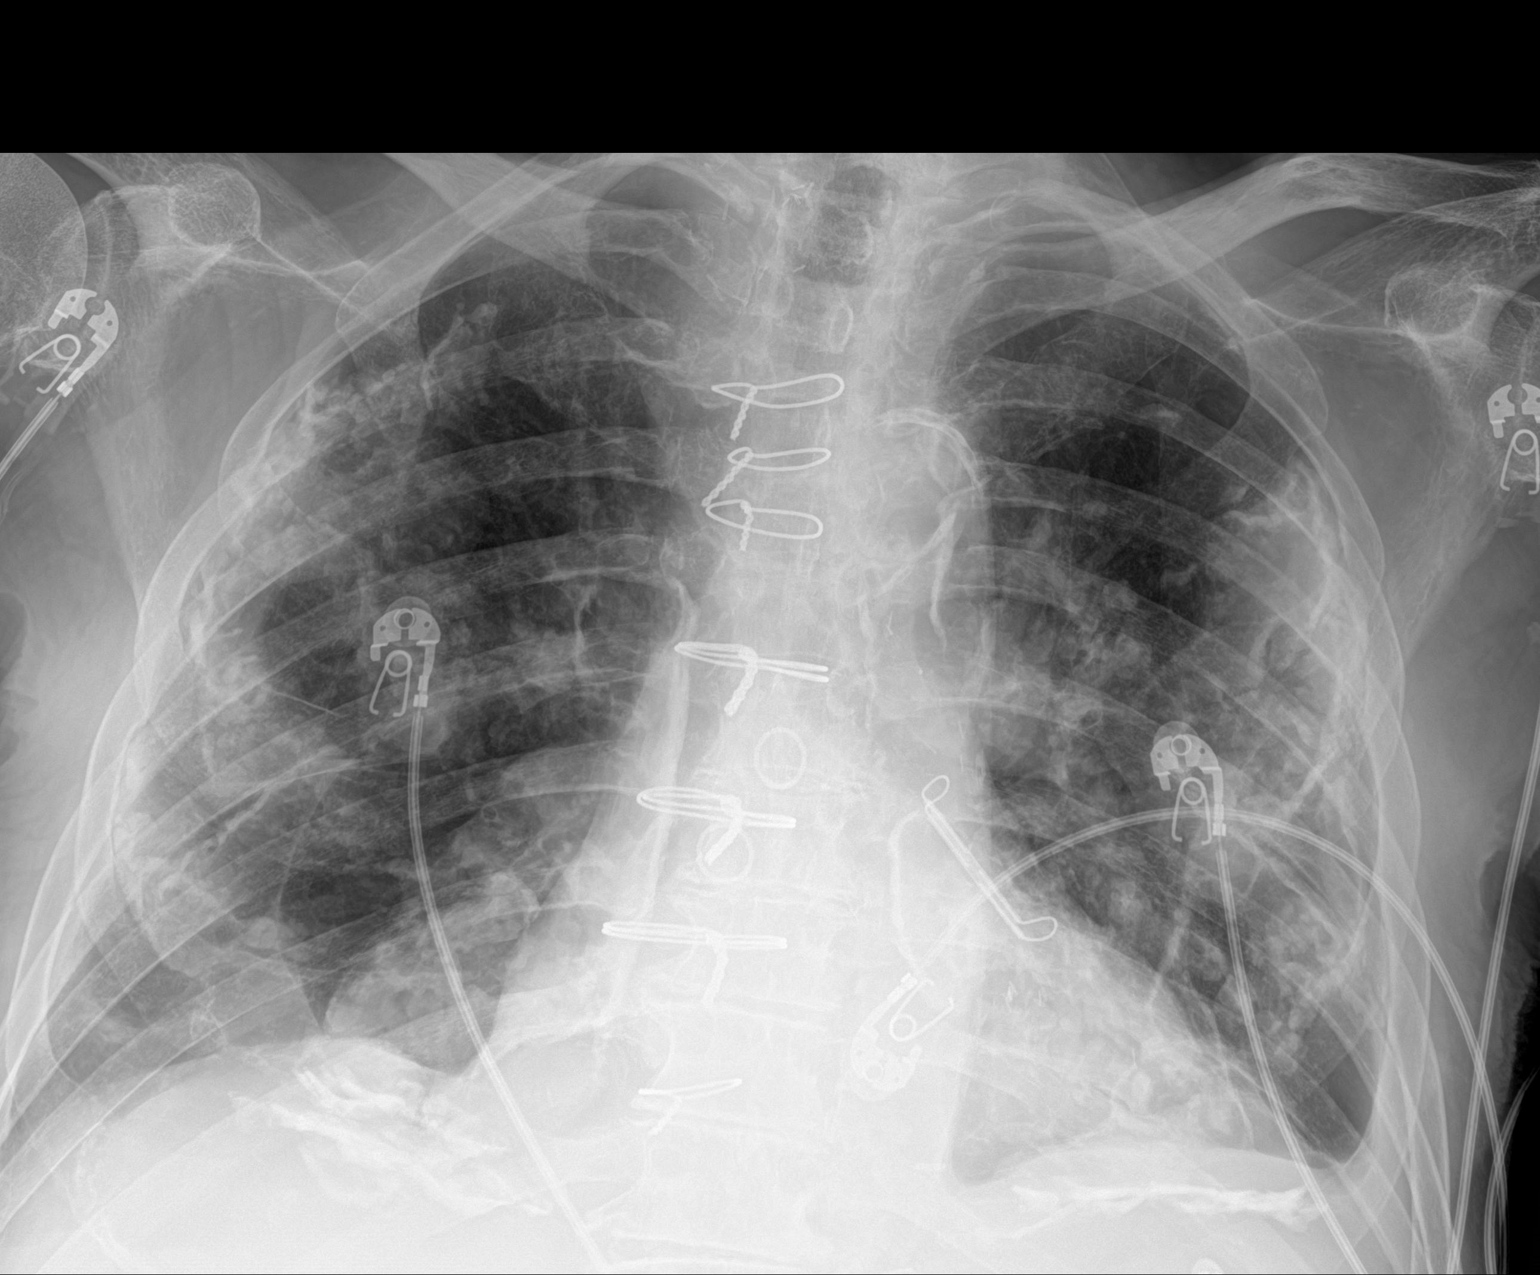

[1 of 1 positions shown; findings below may reference images not displayed]

FINDINGS: No new consolidative opacity is present however the extensive
partially calcified pleural plaque seen on multiple prior comparison
imaging may obscure detection of underlying airspace disease.
Chronic blunting of the left costophrenic sulcus is present
compatible with chronic effusion seen on priors. Stable cardiomegaly
with a calcified, tortuous aorta as well as features of prior
sternotomy and CABG as well as placement of the left atrial occluder
device. Dense coronary artery calcifications as well as coronary
stenting is noted. No acute osseous or soft tissue abnormality.
Telemetry leads overlie the chest.
IMPRESSION: 1. No new consolidative opacity however the extensive partially
calcified pleural plaque may obscure detection of underlying
airspace disease.
2. Chronic left pleural effusion.
3. Stable cardiomegaly and postsurgical changes.
